# Patient Record
Sex: Female | Born: 1970
Health system: Southern US, Community
[De-identification: ages and names within clinical notes are randomized; demographics above are authoritative.]

## PROBLEM LIST (undated history)

## (undated) DIAGNOSIS — N83209 Unspecified ovarian cyst, unspecified side: Secondary | ICD-10-CM

## (undated) DIAGNOSIS — K859 Acute pancreatitis without necrosis or infection, unspecified: Secondary | ICD-10-CM

## (undated) DIAGNOSIS — K862 Cyst of pancreas: Secondary | ICD-10-CM

## (undated) DIAGNOSIS — I10 Essential (primary) hypertension: Secondary | ICD-10-CM

## (undated) HISTORY — PX: TUBAL LIGATION: SHX77

## (undated) HISTORY — PX: CHOLECYSTECTOMY: SHX55

---

## 1998-10-22 ENCOUNTER — Inpatient Hospital Stay (HOSPITAL_COMMUNITY): Admission: AD | Admit: 1998-10-22 | Discharge: 1998-10-22 | Payer: Self-pay | Admitting: Obstetrics & Gynecology

## 2003-01-07 ENCOUNTER — Inpatient Hospital Stay (HOSPITAL_COMMUNITY): Admission: AD | Admit: 2003-01-07 | Discharge: 2003-01-07 | Payer: Self-pay | Admitting: *Deleted

## 2004-04-21 ENCOUNTER — Emergency Department (HOSPITAL_COMMUNITY): Admission: EM | Admit: 2004-04-21 | Discharge: 2004-04-21 | Payer: Self-pay | Admitting: Emergency Medicine

## 2004-12-30 ENCOUNTER — Inpatient Hospital Stay (HOSPITAL_COMMUNITY): Admission: AD | Admit: 2004-12-30 | Discharge: 2004-12-30 | Payer: Self-pay | Admitting: *Deleted

## 2005-12-01 ENCOUNTER — Emergency Department (HOSPITAL_COMMUNITY): Admission: EM | Admit: 2005-12-01 | Discharge: 2005-12-01 | Payer: Self-pay | Admitting: Family Medicine

## 2006-01-13 ENCOUNTER — Emergency Department (HOSPITAL_COMMUNITY): Admission: EM | Admit: 2006-01-13 | Discharge: 2006-01-13 | Payer: Self-pay | Admitting: Family Medicine

## 2007-10-19 ENCOUNTER — Emergency Department (HOSPITAL_COMMUNITY): Admission: EM | Admit: 2007-10-19 | Discharge: 2007-10-19 | Payer: Self-pay | Admitting: Family Medicine

## 2008-05-11 ENCOUNTER — Emergency Department (HOSPITAL_COMMUNITY): Admission: EM | Admit: 2008-05-11 | Discharge: 2008-05-11 | Payer: Self-pay | Admitting: Emergency Medicine

## 2008-06-10 ENCOUNTER — Emergency Department: Payer: Self-pay | Admitting: Emergency Medicine

## 2009-07-26 ENCOUNTER — Emergency Department (HOSPITAL_COMMUNITY): Admission: EM | Admit: 2009-07-26 | Discharge: 2009-07-26 | Payer: Self-pay | Admitting: Emergency Medicine

## 2009-12-06 ENCOUNTER — Emergency Department (HOSPITAL_COMMUNITY): Admission: EM | Admit: 2009-12-06 | Discharge: 2009-12-06 | Payer: Self-pay | Admitting: Family Medicine

## 2010-01-07 ENCOUNTER — Inpatient Hospital Stay (HOSPITAL_COMMUNITY): Admission: EM | Admit: 2010-01-07 | Discharge: 2010-01-12 | Payer: Self-pay | Admitting: Emergency Medicine

## 2010-11-30 LAB — COMPREHENSIVE METABOLIC PANEL
ALT: 228 U/L — ABNORMAL HIGH (ref 0–35)
AST: 19 U/L (ref 0–37)
Albumin: 3.9 g/dL (ref 3.5–5.2)
BUN: 1 mg/dL — ABNORMAL LOW (ref 6–23)
BUN: 3 mg/dL — ABNORMAL LOW (ref 6–23)
CO2: 25 mEq/L (ref 19–32)
Calcium: 9 mg/dL (ref 8.4–10.5)
Chloride: 107 mEq/L (ref 96–112)
Creatinine, Ser: 0.56 mg/dL (ref 0.4–1.2)
GFR calc non Af Amer: >60 mL/min (ref >60)
GFR calc non Af Amer: >60 mL/min (ref >60)
Glucose, Bld: 101 mg/dL — ABNORMAL HIGH (ref 70–99)
Glucose, Bld: 119 mg/dL — ABNORMAL HIGH (ref 70–99)
Potassium: 3.1 mEq/L — ABNORMAL LOW (ref 3.5–5.1)
Potassium: 3.6 mEq/L (ref 3.5–5.1)
Sodium: 138 mEq/L (ref 135–145)
Total Bilirubin: 0.6 mg/dL (ref 0.3–1.2)
Total Protein: 5.7 g/dL — ABNORMAL LOW (ref 6.0–8.3)
Total Protein: 7.6 g/dL (ref 6.0–8.3)

## 2010-11-30 LAB — URINE DRUGS OF ABUSE SCREEN W ALC, ROUTINE (REF LAB)
Amphetamine Screen, Ur: NEGATIVE
Creatinine,U: 229.7 mg/dL
Ethyl Alcohol: <10 mg/dL (ref <10)
Methadone: POSITIVE — AB
Opiate Screen, Urine: NEGATIVE

## 2010-11-30 LAB — CBC
HCT: 27.1 % — ABNORMAL LOW (ref 36.0–46.0)
HCT: 28 % — ABNORMAL LOW (ref 36.0–46.0)
HCT: 29.5 % — ABNORMAL LOW (ref 36.0–46.0)
Hemoglobin: 9.1 g/dL — ABNORMAL LOW (ref 12.0–15.0)
Hemoglobin: 12.9 g/dL (ref 12.0–15.0)
MCHC: 33.5 g/dL (ref 30.0–36.0)
MCHC: 33.5 g/dL (ref 30.0–36.0)
MCV: 87.8 fL (ref 78.0–100.0)
MCV: 86.2 fL (ref 78.0–100.0)
Platelets: 195 10*3/uL (ref 150–400)
Platelets: 274 10*3/uL (ref 150–400)
RBC: 3.09 MIL/uL — ABNORMAL LOW (ref 3.87–5.11)
RBC: 3.25 MIL/uL — ABNORMAL LOW (ref 3.87–5.11)
RDW: 14.3 % (ref 11.5–15.5)
RDW: 14.6 % (ref 11.5–15.5)
WBC: 6.9 10*3/uL (ref 4.0–10.5)
WBC: 9.1 10*3/uL (ref 4.0–10.5)

## 2010-11-30 LAB — BASIC METABOLIC PANEL
BUN: 2 mg/dL — ABNORMAL LOW (ref 6–23)
BUN: 9 mg/dL (ref 6–23)
CO2: 28 mEq/L (ref 19–32)
Calcium: 8.1 mg/dL — ABNORMAL LOW (ref 8.4–10.5)
Calcium: 7.8 mg/dL — ABNORMAL LOW (ref 8.4–10.5)
Creatinine, Ser: 0.56 mg/dL (ref 0.4–1.2)
Creatinine, Ser: 0.63 mg/dL (ref 0.4–1.2)
GFR calc Af Amer: >60 mL/min (ref >60)
GFR calc non Af Amer: >60 mL/min (ref >60)
GFR calc non Af Amer: >60 mL/min (ref >60)
Glucose, Bld: 100 mg/dL — ABNORMAL HIGH (ref 70–99)
Glucose, Bld: 84 mg/dL (ref 70–99)
Potassium: 3.6 mEq/L (ref 3.5–5.1)
Potassium: 3.4 mEq/L — ABNORMAL LOW (ref 3.5–5.1)
Sodium: 140 mEq/L (ref 135–145)

## 2010-11-30 LAB — URINALYSIS, ROUTINE W REFLEX MICROSCOPIC
Glucose, UA: 100 mg/dL — AB
Hgb urine dipstick: NEGATIVE
Protein, ur: NEGATIVE mg/dL
Specific Gravity, Urine: 1.019 (ref 1.005–1.030)
pH: 7.5 (ref 5.0–8.0)

## 2010-11-30 LAB — LIPASE, BLOOD
Lipase: 130 U/L — ABNORMAL HIGH (ref 11–59)
Lipase: 138 U/L — ABNORMAL HIGH (ref 11–59)
Lipase: 142 U/L — ABNORMAL HIGH (ref 11–59)

## 2010-11-30 LAB — URINE MICROSCOPIC-ADD ON

## 2010-11-30 LAB — LIPID PANEL
HDL: 30 mg/dL — ABNORMAL LOW (ref >39)
LDL Cholesterol: 91 mg/dL (ref 0–99)
Total CHOL/HDL Ratio: 5.1 RATIO
Triglycerides: 153 mg/dL — ABNORMAL HIGH (ref <150)
VLDL: 31 mg/dL (ref 0–40)

## 2010-11-30 LAB — GLUCOSE, CAPILLARY
Glucose-Capillary: 101 mg/dL — ABNORMAL HIGH (ref 70–99)
Glucose-Capillary: 106 mg/dL — ABNORMAL HIGH (ref 70–99)
Glucose-Capillary: 139 mg/dL — ABNORMAL HIGH (ref 70–99)
Glucose-Capillary: 155 mg/dL — ABNORMAL HIGH (ref 70–99)
Glucose-Capillary: 90 mg/dL (ref 70–99)
Glucose-Capillary: 99 mg/dL (ref 70–99)
Glucose-Capillary: 64 mg/dL — ABNORMAL LOW (ref 70–99)
Glucose-Capillary: 92 mg/dL (ref 70–99)
Glucose-Capillary: 95 mg/dL (ref 70–99)
Glucose-Capillary: 96 mg/dL (ref 70–99)

## 2010-11-30 LAB — IRON AND TIBC
Saturation Ratios: 4 % — ABNORMAL LOW (ref 20–55)
TIBC: 301 ug/dL (ref 250–470)

## 2010-11-30 LAB — DIFFERENTIAL
Lymphs Abs: 0.9 10*3/uL (ref 0.7–4.0)
Monocytes Absolute: 0.6 10*3/uL (ref 0.1–1.0)
Monocytes Relative: 4 % (ref 3–12)
Neutro Abs: 14 10*3/uL — ABNORMAL HIGH (ref 1.7–7.7)
Neutrophils Relative %: 90 % — ABNORMAL HIGH (ref 43–77)

## 2010-11-30 LAB — THC (MARIJUANA), URINE, CONFIRMATION: Marijuana, Ur-Confirmation: 438 NG/ML — ABNORMAL HIGH

## 2010-11-30 LAB — HEPATIC FUNCTION PANEL
AST: 74 U/L — ABNORMAL HIGH (ref 0–37)
Bilirubin, Direct: 0.1 mg/dL (ref 0.0–0.3)

## 2010-11-30 LAB — METHADONE, CONFIRMATION: Methadone GC/MS Conf: 4313 NG/ML — ABNORMAL HIGH

## 2010-11-30 LAB — URINE CULTURE: Colony Count: 85000

## 2010-11-30 LAB — RETICULOCYTES: Retic Count, Absolute: 40.7 10*3/uL (ref 19.0–186.0)

## 2011-06-03 LAB — POCT PREGNANCY, URINE
Operator id: 239701
Preg Test, Ur: NEGATIVE

## 2011-06-03 LAB — POCT URINALYSIS DIP (DEVICE)
Bilirubin Urine: NEGATIVE
Glucose, UA: NEGATIVE
Nitrite: NEGATIVE
Urobilinogen, UA: 0.2
pH: 8.5 — ABNORMAL HIGH

## 2011-11-25 ENCOUNTER — Emergency Department (HOSPITAL_BASED_OUTPATIENT_CLINIC_OR_DEPARTMENT_OTHER)
Admission: EM | Admit: 2011-11-25 | Discharge: 2011-11-25 | Disposition: A | Discharge status: Home Health | Payer: Medicaid Other | Attending: Emergency Medicine | Admitting: Emergency Medicine

## 2011-11-25 ENCOUNTER — Emergency Department (INDEPENDENT_AMBULATORY_CARE_PROVIDER_SITE_OTHER): Payer: Medicaid Other

## 2011-11-25 ENCOUNTER — Encounter (HOSPITAL_BASED_OUTPATIENT_CLINIC_OR_DEPARTMENT_OTHER): Payer: Self-pay | Admitting: Family Medicine

## 2011-11-25 DIAGNOSIS — Z7982 Long term (current) use of aspirin: Secondary | ICD-10-CM | POA: Insufficient documentation

## 2011-11-25 DIAGNOSIS — R109 Unspecified abdominal pain: Secondary | ICD-10-CM | POA: Insufficient documentation

## 2011-11-25 DIAGNOSIS — K863 Pseudocyst of pancreas: Secondary | ICD-10-CM

## 2011-11-25 DIAGNOSIS — K089 Disorder of teeth and supporting structures, unspecified: Secondary | ICD-10-CM | POA: Insufficient documentation

## 2011-11-25 DIAGNOSIS — N83209 Unspecified ovarian cyst, unspecified side: Secondary | ICD-10-CM

## 2011-11-25 DIAGNOSIS — K862 Cyst of pancreas: Secondary | ICD-10-CM

## 2011-11-25 DIAGNOSIS — K838 Other specified diseases of biliary tract: Secondary | ICD-10-CM

## 2011-11-25 DIAGNOSIS — R110 Nausea: Secondary | ICD-10-CM | POA: Insufficient documentation

## 2011-11-25 DIAGNOSIS — Z9089 Acquired absence of other organs: Secondary | ICD-10-CM

## 2011-11-25 DIAGNOSIS — R10819 Abdominal tenderness, unspecified site: Secondary | ICD-10-CM | POA: Insufficient documentation

## 2011-11-25 HISTORY — DX: Cyst of pancreas: K86.2

## 2011-11-25 LAB — URINALYSIS, ROUTINE W REFLEX MICROSCOPIC
Bilirubin Urine: NEGATIVE
Glucose, UA: NEGATIVE mg/dL
Ketones, ur: NEGATIVE mg/dL
Specific Gravity, Urine: 1.009 (ref 1.005–1.030)
pH: 7 (ref 5.0–8.0)

## 2011-11-25 LAB — CBC
Hemoglobin: 12.1 g/dL (ref 12.0–15.0)
MCHC: 32.6 g/dL (ref 30.0–36.0)
Platelets: 237 10*3/uL (ref 150–400)

## 2011-11-25 LAB — COMPREHENSIVE METABOLIC PANEL
ALT: 14 U/L (ref 0–35)
AST: 15 U/L (ref 0–37)
Albumin: 3.5 g/dL (ref 3.5–5.2)
Alkaline Phosphatase: 82 U/L (ref 39–117)
BUN: 15 mg/dL (ref 6–23)
Chloride: 102 mEq/L (ref 96–112)
Potassium: 3.9 mEq/L (ref 3.5–5.1)
Sodium: 137 mEq/L (ref 135–145)
Total Bilirubin: 0.2 mg/dL — ABNORMAL LOW (ref 0.3–1.2)
Total Protein: 7.5 g/dL (ref 6.0–8.3)

## 2011-11-25 LAB — DIFFERENTIAL
Basophils Absolute: 0 10*3/uL (ref 0.0–0.1)
Basophils Relative: 0 % (ref 0–1)
Monocytes Relative: 10 % (ref 3–12)
Neutro Abs: 5.2 10*3/uL (ref 1.7–7.7)
Neutrophils Relative %: 59 % (ref 43–77)

## 2011-11-25 LAB — URINE MICROSCOPIC-ADD ON

## 2011-11-25 MED ORDER — IOHEXOL 300 MG/ML  SOLN
20.0000 mL | INTRAMUSCULAR | Status: AC
  Administered 2011-11-25 (×2): 20 mL via ORAL

## 2011-11-25 MED ORDER — MORPHINE SULFATE 4 MG/ML IJ SOLN
4.0000 mg | Freq: Once | INTRAMUSCULAR | Status: AC
  Administered 2011-11-25: 4 mg via INTRAVENOUS
  Filled 2011-11-25: qty 1

## 2011-11-25 MED ORDER — SODIUM CHLORIDE 0.9 % IV SOLN
Freq: Once | INTRAVENOUS | Status: AC
  Administered 2011-11-25: 12:00:00 via INTRAVENOUS

## 2011-11-25 MED ORDER — PROMETHAZINE HCL 25 MG/ML IJ SOLN
12.5000 mg | Freq: Once | INTRAMUSCULAR | Status: AC
  Administered 2011-11-25: 13:00:00 via INTRAVENOUS
  Filled 2011-11-25: qty 1

## 2011-11-25 MED ORDER — OXYCODONE-ACETAMINOPHEN 5-325 MG PO TABS: 1.0000 | ORAL_TABLET | Freq: Four times a day (QID) | ORAL | Status: AC | PRN

## 2011-11-25 MED ORDER — IOHEXOL 300 MG/ML  SOLN: 100.0000 mL | Freq: Once | INTRAMUSCULAR | Status: DC | PRN

## 2011-11-25 NOTE — ED Notes (Addendum)
Pt c/o "dental abscess" x 3 wks to left lower and left side (rib area), tenderness to palpation and pain with movement. Pt sts she had a "pancreas cyst" and hospitalized when she lived in [place] but has not followed up and does not have MD here. Pt denies nv/d. Pt sts left side pain x 1 month.

## 2011-11-25 NOTE — Discharge Instructions (Signed)

## 2011-11-25 NOTE — ED Provider Notes (Signed)
History     CSN: [id number]  Arrival date & time 11/25/11  1048   First MD Initiated Contact with Patient 11/25/11 1145      Chief Complaint  Patient presents with  . Dental Pain  . Flank Pain    (Consider location/radiation/quality/duration/timing/severity/associated sxs/prior treatment) HPI Comments: Patient has history of pancreatic pseudocyst which was treated with a long hospitalization in [place], [place].  She was discharged and has since moved here with no follow up.  She started with pain in the left side of the abdomen several days ago which is now worsening.  No fevers or chills.    Patient is a 41 y.o. female presenting with abdominal pain. The history is provided by the patient.  Abdominal Pain The primary symptoms of the illness include abdominal pain and nausea. The primary symptoms of the illness do not include fever, vomiting or diarrhea. Episode onset: several days ago. The problem has been gradually worsening.  Associated with: nothing. The patient states that she believes she is currently not pregnant. The patient has not had a change in bowel habit. Symptoms associated with the illness do not include chills.    Past Medical History  Diagnosis Date  . Pancreatic cyst     Past Surgical History  Procedure Date  . Cholecystectomy   . Tubal ligation     No family history on file.  History  Substance Use Topics  . Smoking status: Never Smoker   . Smokeless tobacco: Not on file  . Alcohol Use: No    OB History    Grav Para Term Preterm Abortions TAB SAB Ect Mult Living                  Review of Systems  Constitutional: Negative for fever and chills.  Gastrointestinal: Positive for nausea and abdominal pain. Negative for vomiting and diarrhea.  All other systems reviewed and are negative.    Allergies  Zofran  Home Medications   Current Outpatient Rx  Name Route Sig Dispense Refill  . ASPIRIN 81 MG PO TABS Oral Take 81 mg by mouth daily.       BP 162/107  Pulse 85  Temp(Src) 98.3 F (36.8 C) (Oral)  Resp 16  Ht 5\' 4"  (1.626 m)  Wt 260 lb (117.935 kg)  BMI 44.63 kg/m2  SpO2 99%  LMP 11/24/2011  Physical Exam  Nursing note and vitals reviewed. Constitutional: She is oriented to person, place, and time. She appears well-developed and well-nourished. No distress.  HENT:  Head: Normocephalic and atraumatic.  Neck: Normal range of motion. Neck supple.  Cardiovascular: Normal rate and regular rhythm.  Exam reveals no gallop and no friction rub.   No murmur heard. Pulmonary/Chest: Effort normal and breath sounds normal. No respiratory distress. She has no wheezes.  Abdominal: Soft. Bowel sounds are normal.       There is ttp in the epigastric and luq.  There is voluntary guarding but no rebound.  Bowel sounds are normoactive.  Musculoskeletal: Normal range of motion.  Neurological: She is alert and oriented to person, place, and time.  Skin: Skin is warm and dry. She is not diaphoretic.    ED Course  Procedures (including critical care time)   Labs Reviewed  CBC  DIFFERENTIAL  COMPREHENSIVE METABOLIC PANEL  LIPASE, BLOOD  URINALYSIS, ROUTINE W REFLEX MICROSCOPIC   No results found.   No diagnosis found.    MDM  The labs and ct scan look okay.  There  is a pseudocyst, but no sign of anything acute.  The wbc is okay and the liver and pancreatic enzymes are negative.  She needs local GI follow up and will be referred to [facility] for this.  To return prn.        Geoffery Lyons, MD 11/25/11 1425

## 2012-01-29 ENCOUNTER — Encounter (HOSPITAL_BASED_OUTPATIENT_CLINIC_OR_DEPARTMENT_OTHER): Payer: Self-pay | Admitting: Emergency Medicine

## 2012-01-29 ENCOUNTER — Emergency Department (HOSPITAL_BASED_OUTPATIENT_CLINIC_OR_DEPARTMENT_OTHER)
Admission: EM | Admit: 2012-01-29 | Discharge: 2012-01-29 | Disposition: A | Discharge status: Home / Self Care | Payer: Self-pay | Attending: Emergency Medicine | Admitting: Emergency Medicine

## 2012-01-29 DIAGNOSIS — K0889 Other specified disorders of teeth and supporting structures: Secondary | ICD-10-CM

## 2012-01-29 DIAGNOSIS — K089 Disorder of teeth and supporting structures, unspecified: Secondary | ICD-10-CM | POA: Insufficient documentation

## 2012-01-29 DIAGNOSIS — K029 Dental caries, unspecified: Secondary | ICD-10-CM | POA: Insufficient documentation

## 2012-01-29 MED ORDER — HYDROCODONE-ACETAMINOPHEN 5-325 MG PO TABS: 2.0000 | ORAL_TABLET | ORAL | Status: AC | PRN

## 2012-01-29 MED ORDER — PENICILLIN V POTASSIUM 500 MG PO TABS: 500.0000 mg | ORAL_TABLET | Freq: Four times a day (QID) | ORAL | Status: AC

## 2012-01-29 MED ORDER — PROMETHAZINE HCL 25 MG PO TABS: 25.0000 mg | ORAL_TABLET | Freq: Four times a day (QID) | ORAL | Status: DC | PRN

## 2012-01-29 NOTE — ED Notes (Signed)
Left lower dental pain x 3 days.  No known fever.

## 2012-01-29 NOTE — ED Provider Notes (Signed)
Medical screening examination/treatment/procedure(s) were performed by non-physician practitioner and as supervising physician I was immediately available for consultation/collaboration.   Dayton Bailiff, MD 01/29/12 1316

## 2012-01-29 NOTE — ED Provider Notes (Signed)
History     CSN: [id number]  Arrival date & time 01/29/12  1153   First MD Initiated Contact with Patient 01/29/12 1218      No chief complaint on file.   (Consider location/radiation/quality/duration/timing/severity/associated sxs/prior treatment) Patient is a 41 y.o. female presenting with tooth pain. The history is provided by the patient. No language interpreter was used.  Dental PainThe primary symptoms include oral lesions. The symptoms began more than 1 week ago. The symptoms are worsening. The symptoms are new. The symptoms occur constantly.  Additional symptoms include: gum swelling and gum tenderness.    Past Medical History  Diagnosis Date  . Pancreatic cyst     Past Surgical History  Procedure Date  . Cholecystectomy   . Tubal ligation     No family history on file.  History  Substance Use Topics  . Smoking status: Never Smoker   . Smokeless tobacco: Not on file  . Alcohol Use: No    OB History    Grav Para Term Preterm Abortions TAB SAB Ect Mult Living                  Review of Systems  HENT: Positive for mouth sores.   All other systems reviewed and are negative.    Allergies  Zofran  Home Medications   Current Outpatient Rx  Name Route Sig Dispense Refill  . ASPIRIN 81 MG PO TABS Oral Take 81 mg by mouth daily.      BP 150/94  Pulse 84  Temp(Src) 98.1 F (36.7 C) (Oral)  Resp 18  SpO2 100%  Physical Exam  Nursing note and vitals reviewed. Constitutional: She is oriented to person, place, and time. She appears well-developed and well-nourished.  HENT:  Head: Normocephalic and atraumatic.  Right Ear: External ear normal.  Left Ear: External ear normal.       Swollen lower gum line broken decayed teeth  Cardiovascular: Normal rate.   Pulmonary/Chest: Effort normal.  Musculoskeletal: Normal range of motion.  Neurological: She is alert and oriented to person, place, and time. She has normal reflexes.  Skin: Skin is warm.    Psychiatric: She has a normal mood and affect.    ED Course  Procedures (including critical care time)  Labs Reviewed - No data to display No results found.   No diagnosis found.    MDM  Pt given phenergan,hydrocodone and pcn,   Pt referred to [facility] for treatment       Elson Areas, [place] 01/29/12 1258

## 2012-01-29 NOTE — Discharge Instructions (Signed)
Dental Pain A tooth ache may be caused by cavities (tooth decay). Cavities expose the nerve of the tooth to air and hot or cold temperatures. It may come from an infection or abscess (also called a boil or furuncle) around your tooth. It is also often caused by dental caries (tooth decay). This causes the pain you are having. DIAGNOSIS  Your caregiver can diagnose this problem by exam. TREATMENT   If caused by an infection, it may be treated with medications which kill germs (antibiotics) and pain medications as prescribed by your caregiver. Take medications as directed.   Only take over-the-counter or prescription medicines for pain, discomfort, or fever as directed by your caregiver.   Whether the tooth ache today is caused by infection or dental disease, you should see your dentist as soon as possible for further care.  SEEK MEDICAL CARE IF: The exam and treatment you received today has been provided on an emergency basis only. This is not a substitute for complete medical or dental care. If your problem worsens or new problems (symptoms) appear, and you are unable to meet with your dentist, call or return to this location. SEEK IMMEDIATE MEDICAL CARE IF:   You have a fever.   You develop redness and swelling of your face, jaw, or neck.   You are unable to open your mouth.   You have severe pain uncontrolled by pain medicine.  MAKE SURE YOU:   Understand these instructions.   Will watch your condition.   Will get help right away if you are not doing well or get worse.  Document Released: 08/29/2005 Document Revised: 08/18/2011 Document Reviewed: 04/16/2008 [employer] Patient Information 2012 [employer].Dental Pain A tooth ache may be caused by cavities (tooth decay). Cavities expose the nerve of the tooth to air and hot or cold temperatures. It may come from an infection or abscess (also called a boil or furuncle) around your tooth. It is also often caused by dental caries (tooth  decay). This causes the pain you are having. DIAGNOSIS  Your caregiver can diagnose this problem by exam. TREATMENT   If caused by an infection, it may be treated with medications which kill germs (antibiotics) and pain medications as prescribed by your caregiver. Take medications as directed.   Only take over-the-counter or prescription medicines for pain, discomfort, or fever as directed by your caregiver.   Whether the tooth ache today is caused by infection or dental disease, you should see your dentist as soon as possible for further care.  SEEK MEDICAL CARE IF: The exam and treatment you received today has been provided on an emergency basis only. This is not a substitute for complete medical or dental care. If your problem worsens or new problems (symptoms) appear, and you are unable to meet with your dentist, call or return to this location. SEEK IMMEDIATE MEDICAL CARE IF:   You have a fever.   You develop redness and swelling of your face, jaw, or neck.   You are unable to open your mouth.   You have severe pain uncontrolled by pain medicine.  MAKE SURE YOU:   Understand these instructions.   Will watch your condition.   Will get help right away if you are not doing well or get worse.  Document Released: 08/29/2005 Document Revised: 08/18/2011 Document Reviewed: 04/16/2008 [employer] Patient Information 2012 [employer]. 

## 2012-02-11 ENCOUNTER — Emergency Department (HOSPITAL_BASED_OUTPATIENT_CLINIC_OR_DEPARTMENT_OTHER)
Admission: EM | Admit: 2012-02-11 | Discharge: 2012-02-11 | Disposition: A | Discharge status: Home / Self Care | Payer: Self-pay | Attending: Emergency Medicine | Admitting: Emergency Medicine

## 2012-02-11 ENCOUNTER — Encounter (HOSPITAL_BASED_OUTPATIENT_CLINIC_OR_DEPARTMENT_OTHER): Payer: Self-pay | Admitting: *Deleted

## 2012-02-11 DIAGNOSIS — K861 Other chronic pancreatitis: Secondary | ICD-10-CM | POA: Insufficient documentation

## 2012-02-11 DIAGNOSIS — R109 Unspecified abdominal pain: Secondary | ICD-10-CM

## 2012-02-11 DIAGNOSIS — R1011 Right upper quadrant pain: Secondary | ICD-10-CM | POA: Insufficient documentation

## 2012-02-11 DIAGNOSIS — R1013 Epigastric pain: Secondary | ICD-10-CM | POA: Insufficient documentation

## 2012-02-11 HISTORY — DX: Acute pancreatitis without necrosis or infection, unspecified: K85.90

## 2012-02-11 LAB — COMPREHENSIVE METABOLIC PANEL
AST: 16 U/L (ref 0–37)
Albumin: 3.8 g/dL (ref 3.5–5.2)
BUN: 18 mg/dL (ref 6–23)
CO2: 24 mEq/L (ref 19–32)
Calcium: 9 mg/dL (ref 8.4–10.5)
Creatinine, Ser: 0.7 mg/dL (ref 0.50–1.10)
GFR calc non Af Amer: >90 mL/min (ref >90)
Total Bilirubin: 0.3 mg/dL (ref 0.3–1.2)

## 2012-02-11 LAB — CBC
Hemoglobin: 12.5 g/dL (ref 12.0–15.0)
MCH: 29.6 pg (ref 26.0–34.0)
Platelets: 257 10*3/uL (ref 150–400)
RBC: 4.22 MIL/uL (ref 3.87–5.11)
WBC: 13.4 10*3/uL — ABNORMAL HIGH (ref 4.0–10.5)

## 2012-02-11 LAB — URINALYSIS, ROUTINE W REFLEX MICROSCOPIC
Bilirubin Urine: NEGATIVE
Ketones, ur: NEGATIVE mg/dL
Nitrite: NEGATIVE
Protein, ur: NEGATIVE mg/dL

## 2012-02-11 LAB — DIFFERENTIAL
Eosinophils Absolute: 0.2 10*3/uL (ref 0.0–0.7)
Lymphocytes Relative: 22 % (ref 12–46)
Lymphs Abs: 2.9 10*3/uL (ref 0.7–4.0)
Monocytes Relative: 9 % (ref 3–12)
Neutrophils Relative %: 68 % (ref 43–77)

## 2012-02-11 LAB — LIPASE, BLOOD: Lipase: 38 U/L (ref 11–59)

## 2012-02-11 LAB — URINE MICROSCOPIC-ADD ON

## 2012-02-11 LAB — PREGNANCY, URINE: Preg Test, Ur: NEGATIVE

## 2012-02-11 LAB — AMYLASE: Amylase: 69 U/L (ref 0–105)

## 2012-02-11 MED ORDER — HYDROMORPHONE HCL 2 MG PO TABS: 2.0000 mg | ORAL_TABLET | ORAL | Status: AC | PRN

## 2012-02-11 MED ORDER — SODIUM CHLORIDE 0.9 % IV BOLUS (SEPSIS)
1000.0000 mL | Freq: Once | INTRAVENOUS | Status: AC
  Administered 2012-02-11: 1000 mL via INTRAVENOUS

## 2012-02-11 MED ORDER — DIPHENHYDRAMINE HCL 50 MG/ML IJ SOLN
25.0000 mg | Freq: Once | INTRAMUSCULAR | Status: AC
  Administered 2012-02-11: 25 mg via INTRAVENOUS
  Filled 2012-02-11: qty 1

## 2012-02-11 MED ORDER — METOCLOPRAMIDE HCL 5 MG/ML IJ SOLN
10.0000 mg | Freq: Once | INTRAMUSCULAR | Status: AC
  Administered 2012-02-11: 10 mg via INTRAVENOUS
  Filled 2012-02-11: qty 2

## 2012-02-11 MED ORDER — HYDROMORPHONE HCL PF 1 MG/ML IJ SOLN
1.0000 mg | Freq: Once | INTRAMUSCULAR | Status: AC
  Administered 2012-02-11: 1 mg via INTRAVENOUS
  Filled 2012-02-11: qty 1

## 2012-02-11 MED ORDER — SODIUM CHLORIDE 0.9 % IV SOLN
Freq: Once | INTRAVENOUS | Status: AC
  Administered 2012-02-11: 18:00:00 via INTRAVENOUS

## 2012-02-11 MED ORDER — HYDROMORPHONE HCL PF 1 MG/ML IJ SOLN
1.0000 mg | Freq: Once | INTRAMUSCULAR | Status: AC
  Administered 2012-02-11: 1 mg via INTRAVENOUS

## 2012-02-11 MED ORDER — HYDROMORPHONE HCL PF 1 MG/ML IJ SOLN
INTRAMUSCULAR | Status: AC
  Filled 2012-02-11: qty 1

## 2012-02-11 MED ORDER — METOCLOPRAMIDE HCL 10 MG PO TABS: 10.0000 mg | ORAL_TABLET | Freq: Four times a day (QID) | ORAL | Status: DC | PRN

## 2012-02-11 NOTE — ED Notes (Signed)
MD at bedside. 

## 2012-02-11 NOTE — ED Provider Notes (Signed)
History   This chart was scribed for Teresa Booze, MD by Brooks Sailors. The patient was seen in room MH05/MH05. Patient's care was started at 1651.   CSN: [id number]  Arrival date & time 02/11/12  1651   First MD Initiated Contact with Patient 02/11/12 1714      Chief Complaint  Patient presents with  . Abdominal Pain    (Consider location/radiation/quality/duration/timing/severity/associated sxs/prior treatment) Patient is a 41 y.o. female presenting with abdominal pain.  Abdominal Pain The primary symptoms of the illness include abdominal pain. The current episode started more than 2 days ago. The onset of the illness was gradual. The problem has not changed since onset. The abdominal pain began more than 2 days ago. The pain came on gradually. The abdominal pain has been unchanged since its onset. The abdominal pain is located in the LUQ. The abdominal pain does not radiate. The severity of the abdominal pain is 6/10. The abdominal pain is relieved by nothing. The abdominal pain is exacerbated by eating.  Symptoms associated with the illness do not include back pain.    Teresa Valenzuela is a 41 y.o. female who presents to the Emergency Department complaining of abdominal pain onset four days ago with associated nausea. Pain is localized to the LUQ. Pt says the pain is persistent since onset and describes the pain as throbbing. Current pain is 6/10 with 9/10 at most severe. Pt denies diarrhea, vomiting, smoking. Nothing makes pain better, certain foods upset stomach more. Patient with history of pancreatic cyst and pancreatitis, cholecystectomy. Patient says she is not a drinker and allergic for Zofran.   Patient with no PCP or GI physician.     Past Medical History  Diagnosis Date  . Pancreatic cyst   . Pancreatitis     Past Surgical History  Procedure Date  . Cholecystectomy   . Tubal ligation     No family history on file.  History  Substance Use Topics  . Smoking  status: Never Smoker   . Smokeless tobacco: Never Used  . Alcohol Use: No    OB History    Grav Para Term Preterm Abortions TAB SAB Ect Mult Living                  Review of Systems  Gastrointestinal: Positive for abdominal pain.  Musculoskeletal: Negative for back pain.  All other systems reviewed and are negative.    Allergies  Zofran  Home Medications   Current Outpatient Rx  Name Route Sig Dispense Refill  . ACETAMINOPHEN 500 MG PO TABS Oral Take 1,000 mg by mouth every 6 (six) hours as needed. For pain    . NAPROXEN SODIUM 220 MG PO TABS Oral Take 440 mg by mouth 2 (two) times daily as needed. For pain      BP 157/99  Pulse 80  Temp(Src) 97.9 F (36.6 C) (Oral)  Resp 18  Ht 5\' 8"  (1.727 m)  Wt 270 lb (122.471 kg)  BMI 41.05 kg/m2  SpO2 100%  LMP 01/30/2012  Physical Exam  Nursing note and vitals reviewed. Constitutional: She is oriented to person, place, and time. She appears well-developed and well-nourished.  HENT:  Head: Normocephalic and atraumatic.  Eyes: Conjunctivae and EOM are normal. Pupils are equal, round, and reactive to light.       No scleral icterus   Neck: Normal range of motion. Neck supple.  Cardiovascular: Normal rate and regular rhythm.   Pulmonary/Chest: Effort normal and breath sounds  normal.  Abdominal: Soft. Bowel sounds are normal. There is tenderness in the epigastric area and left upper quadrant.       Severe LUQ tenderness, Moderate epigastric tenderness  Musculoskeletal: Normal range of motion.  Neurological: She is alert and oriented to person, place, and time.  Skin: Skin is warm and dry.  Psychiatric: She has a normal mood and affect.    ED Course  Procedures (including critical care time)  COORDINATION OF CARE: 1720 Patient informed of current plan for treatment and evaluation and agrees with plan at this time.  [phone] Patient had no relief of pain with first dose of Hydromorphone. Pt with relief of pain after second  dose.   Results for orders placed during the hospital encounter of 02/11/12  URINALYSIS, ROUTINE W REFLEX MICROSCOPIC      Component Value Range   Color, Urine YELLOW  YELLOW    APPearance CLOUDY (*) CLEAR    Specific Gravity, Urine 1.026  1.005 - 1.030    pH 5.5  5.0 - 8.0    Glucose, UA NEGATIVE  NEGATIVE (mg/dL)   Hgb urine dipstick NEGATIVE  NEGATIVE    Bilirubin Urine NEGATIVE  NEGATIVE    Ketones, ur NEGATIVE  NEGATIVE (mg/dL)   Protein, ur NEGATIVE  NEGATIVE (mg/dL)   Urobilinogen, UA 0.2  0.0 - 1.0 (mg/dL)   Nitrite NEGATIVE  NEGATIVE    Leukocytes, UA MODERATE (*) NEGATIVE   COMPREHENSIVE METABOLIC PANEL      Component Value Range   Sodium 138  135 - 145 (mEq/L)   Potassium 3.3 (*) 3.5 - 5.1 (mEq/L)   Chloride 104  96 - 112 (mEq/L)   CO2 24  19 - 32 (mEq/L)   Glucose, Bld 83  70 - 99 (mg/dL)   BUN 18  6 - 23 (mg/dL)   Creatinine, Ser 9.60  0.50 - 1.10 (mg/dL)   Calcium 9.0  8.4 - 45.4 (mg/dL)   Total Protein 7.3  6.0 - 8.3 (g/dL)   Albumin 3.8  3.5 - 5.2 (g/dL)   AST 16  0 - 37 (U/L)   ALT 16  0 - 35 (U/L)   Alkaline Phosphatase 79  39 - 117 (U/L)   Total Bilirubin 0.3  0.3 - 1.2 (mg/dL)   GFR calc non Af Amer >90  >90 (mL/min)   GFR calc Af Amer >90  >90 (mL/min)  CBC      Component Value Range   WBC 13.4 (*) 4.0 - 10.5 (K/uL)   RBC 4.22  3.87 - 5.11 (MIL/uL)   Hemoglobin 12.5  12.0 - 15.0 (g/dL)   HCT 09.8  11.9 - 14.7 (%)   MCV 86.0  78.0 - 100.0 (fL)   MCH 29.6  26.0 - 34.0 (pg)   MCHC 34.4  30.0 - 36.0 (g/dL)   RDW 82.9  56.2 - 13.0 (%)   Platelets 257  150 - 400 (K/uL)  DIFFERENTIAL      Component Value Range   Neutrophils Relative 68  43 - 77 (%)   Neutro Abs 9.1 (*) 1.7 - 7.7 (K/uL)   Lymphocytes Relative 22  12 - 46 (%)   Lymphs Abs 2.9  0.7 - 4.0 (K/uL)   Monocytes Relative 9  3 - 12 (%)   Monocytes Absolute 1.1 (*) 0.1 - 1.0 (K/uL)   Eosinophils Relative 1  0 - 5 (%)   Eosinophils Absolute 0.2  0.0 - 0.7 (K/uL)   Basophils Relative 0  0 - 1  (%)  Basophils Absolute 0.0  0.0 - 0.1 (K/uL)  AMYLASE      Component Value Range   Amylase 69  0 - 105 (U/L)  LIPASE, BLOOD      Component Value Range   Lipase 38  11 - 59 (U/L)  PREGNANCY, URINE      Component Value Range   Preg Test, Ur NEGATIVE  NEGATIVE   URINE MICROSCOPIC-ADD ON      Component Value Range   Squamous Epithelial / LPF FEW (*) RARE    WBC, UA 7-10  <3 (WBC/hpf)   RBC / HPF 0-2  <3 (RBC/hpf)   Bacteria, UA MANY (*) RARE    Urine-Other MUCOUS PRESENT       1. Abdominal pain   2. Chronic pancreatitis       MDM  Probable exacerbation of chronic pancreatitis. She will be given IV fluids, IV narcotics, and IV antiemetics and reassessed.  She did get good relief after second dose of hydromorphone. She was offered options of going home with narcotic prescription for admission and she wants to go home. She is in with prescriptions for hydromorphone and metoclopramide.    I personally performed the services described in this documentation, which was scribed in my presence. The recorded information has been reviewed and considered.      Teresa Booze, MD 02/11/12 [phone]

## 2012-02-11 NOTE — ED Notes (Signed)
C/o upper abd pain x 4 days- hx of pancreatic cyst- also c/o nausea

## 2012-02-11 NOTE — Discharge Instructions (Signed)
Return to the emergency department if pain and nausea are not being adequately controlled at home.  Acute Pancreatitis The pancreas is a large gland located behind your stomach. It produces (secretes) enzymes. These enzymes help digest food. It also releases the hormones glucagon and insulin. These hormones help regulate blood sugar. When the pancreas becomes inflamed, the disease is called pancreatitis. Inflammation of the pancreas occurs when enzymes from the pancreas begin attacking and digesting the pancreas. CAUSES  Most cases ofsudden onset (acute) pancreatitis are caused by:  Alcohol abuse.   Gallstones.  Other less common causes are:  Some medications.   Exposure to certain chemicals   Infection.   Damage caused by an accident (trauma).   Surgery of the belly (abdomen).  SYMPTOMS  Acute pancreatitis usually begins with pain in the upper abdomen and may radiate to the back. This pain may last a couple days. The constant pain varies from mild to severe. The acute form of this disease may vary from mild, nonspecific abdominal pain to profound shock with coma. About 1 in 5 cases are severe. These patients become dehydrated and develop low blood pressure. In severe cases, bleeding into the pancreas can lead to shock and death. The lungs, heart, and kidneys may fail. DIAGNOSIS  Your caregiver will form a clinical opinion after giving you an exam. Laboratory work is used to confirm this diagnosis. Often,a digestive enzyme from the pancreas (serum amylase) and other enzymes are elevated. Sugars and fats (lipids) in the blood may be elevated. There may also be changes in the following levels: calcium, magnesium, potassium, chloride and bicarbonate (chemicals in the blood). X-rays, a CT scan, or ultrasound of your abdomen may be necessary to search for other causes of your abdominal pain. TREATMENT  Most pancreatitis requires treatment of symptoms. Most acute attacks last a couple of days.  Your caregiver can discuss the treatment options with you.  If complications occur, hospitalization may be necessary for pain control and intravenous (IV) fluid replacement.   Sometimes, a tube may be put into the stomach to control vomiting.   Food may not be allowed for 3 to 4 days. This gives the pancreas time to rest. Giving the pancreas a rest means there is no stimulation that would produce more enzymes and cause more damage.   Medicines (antibiotics) that kill germs may be given if infection is the cause.   Sometimes, surgery may be required.   Following an acute attack, your caregiver will determine the cause, if possible, and offer suggestions to prevent recurrences.  HOME CARE INSTRUCTIONS   Eat smaller, more frequent meals. This reduces the amount of digestive juices the pancreas produces.   Decrease the amount of fat in your diet. This may help reduce loose, diarrheal stools.   Drink enough water and fluids to keep your urine clear or pale yellow. This is to avoid dehydration which can cause increased pain.   Talk to your caregiver about pain relievers or other medicines that may help.   Avoid anything that may have triggered your pancreatitis (for example, alcohol).   Follow the diet advised by your caregiver. Do not advance the diet too soon.   Take medicines as prescribed.   Get plenty of rest.   Check your blood sugar at home as directed by your caregiver.   If your caregiver has given you a follow-up appointment, it is very important to keep that appointment. Not keeping the appointment could result in a lasting (chronic) or permanent  injury, pain, and disability. If there is any problem keeping the appointment, you must call to reschedule.  SEEK MEDICAL CARE IF:   You are not recovering in the time described by your caregiver.   You have persistent pain, weakness, or feel sick to your stomach (nauseous).   You have recovered and then have another bout of  pain.  SEEK IMMEDIATE MEDICAL CARE IF:   You are unable to eat or keep fluids down.   Your pain increases a lot or changes.   You have an oral temperature above 102 F (38.9 C), not controlled by medicine.   Your skin or the white part of your eyes look yellow (jaundice).   You develop vomiting.   You feel dizzy or faint.   Your blood sugar is high (over 300).  MAKE SURE YOU:   Understand these instructions.   Will watch your condition.   Will get help right away if you are not doing well or get worse.  Document Released: 08/29/2005 Document Revised: 08/18/2011 Document Reviewed: 04/11/2008 [facility] Patient Information 2012 [place], [place].  Hydromorphone tablets What is this medicine? HYDROMORPHONE (hye droe MOR fone) is a pain reliever. It is used to treat moderate to severe pain. This medicine may be used for other purposes; ask your health care provider or pharmacist if you have questions. What should I tell my health care provider before I take this medicine? They need to know if you have any of these conditions: -brain tumor -drug abuse or addiction -head injury -heart disease -frequently drink alcohol containing drinks -kidney disease or problems going to the bathroom -liver disease -lung disease, asthma, or breathing problems -mental problems -an allergic or unusual reaction to lactose, hydromorphone, other opioid analgesics, other medicines, sulfites, foods, dyes, or preservatives -pregnant or trying to get pregnant -breast-feeding How should I use this medicine? Take this medicine by mouth with a glass of water. If the medicine upsets your stomach, take it with food or milk. Follow the directions on the prescription label. Do not take more medicine than you are told to take. Talk to your pediatrician regarding the use of this medicine in children. Special care may be needed. Overdosage: If you think you have taken too much of this medicine contact a poison  control center or emergency room at once. NOTE: This medicine is only for you. Do not share this medicine with others. What if I miss a dose? If you miss a dose, take it as soon as you can. If it is almost time for your next dose, take only that dose. Do not take double or extra doses. What may interact with this medicine? -alcohol -antihistamines for allergy, cough and cold -medicines for anesthesia -medicines for depression, anxiety, or psychotic disturbances -medicines for sleep -muscle relaxants -naltrexone -narcotic medicines for pain -phenothiazines like chlorpromazine, mesoridazine, prochlorperazine, thioridazine This list may not describe all possible interactions. Give your health care provider a list of all the medicines, herbs, non-prescription drugs, or dietary supplements you use. Also tell them if you smoke, drink alcohol, or use illegal drugs. Some items may interact with your medicine. What should I watch for while using this medicine? Tell your doctor or health care professional if your pain does not go away, if it gets worse, or if you have new or a different type of pain. You may develop tolerance to the medicine. Tolerance means that you will need a higher dose of the medicine for pain relief. Tolerance is normal and is  expected if you take this medicine for a long time. Do not suddenly stop taking your medicine because you may develop a severe reaction. Your body becomes used to the medicine. This does NOT mean you are addicted. Addiction is a behavior related to getting and using a drug for a non-medical reason. If you have pain, you have a medical reason to take pain medicine. Your doctor will tell you how much medicine to take. If your doctor wants you to stop the medicine, the dose will be slowly lowered over time to avoid any side effects. You may get drowsy or dizzy. Do not drive, use machinery, or do anything that needs mental alertness until you know how this medicine  affects you. Do not stand or sit up quickly, especially if you are an older patient. This reduces the risk of dizzy or fainting spells. Alcohol may interfere with the effect of this medicine. Avoid alcoholic drinks. This medicine will cause constipation. Try to have a bowel movement at least every 2 to 3 days. If you do not have a bowel movement for 3 days, call your doctor or health care professional. Your mouth may get dry. Chewing sugarless gum or sucking hard candy, and drinking plenty of water may help. Contact your doctor if the problem does not go away or is severe. What side effects may I notice from receiving this medicine? Side effects that you should report to your doctor or health care professional as soon as possible: -allergic reactions like skin rash, itching or hives, swelling of the face, lips, or tongue -breathing problems -changes in vision -confusion -feeling faint or lightheaded, falls -seizures -slow or fast heartbeat -trouble passing urine or change in the amount of urine -trouble with balance, talking, walking Side effects that usually do not require medical attention (report to your doctor or health care professional if they continue or are bothersome): -difficulty sleeping -drowsiness -dry mouth -flushing -headache -itching -loss of appetite -nausea, vomiting This list may not describe all possible side effects. Call your doctor for medical advice about side effects. You may report side effects to FDA at 1-800-FDA-1088. Where should I keep my medicine? Keep out of the reach of children. This medicine can be abused. Keep your medicine in a safe place to protect it from theft. Do not share this medicine with anyone. Selling or giving away this medicine is dangerous and against the law. Store at room temperature between 15 and 30 degrees C (59 and 86 degrees F). Keep container tightly closed. Protect from light. Flush any unused medicines down the toilet. Do not use  the medicine after the expiration date. NOTE: This sheet is a summary. It may not cover all possible information. If you have questions about this medicine, talk to your doctor, pharmacist, or health care provider.  2012, Elsevier/Gold Standard. (07/25/2008 10:24:26 AM)  Metoclopramide tablets What is this medicine? METOCLOPRAMIDE (met oh kloe PRA mide) is used to treat the symptoms of gastroesophageal reflux disease (GERD) like heartburn. It is also used to treat people with slow emptying of the stomach and intestinal tract. This medicine may be used for other purposes; ask your health care provider or pharmacist if you have questions. What should I tell my health care provider before I take this medicine? They need to know if you have any of these conditions: -breast cancer -depression -diabetes -heart failure -high blood pressure -kidney disease -liver disease -Parkinson's disease or a movement disorder -pheochromocytoma -seizures -stomach obstruction, bleeding, or perforation -an  unusual or allergic reaction to metoclopramide, procainamide, sulfites, other medicines, foods, dyes, or preservatives -pregnant or trying to get pregnant -breast-feeding How should I use this medicine? Take this medicine by mouth with a glass of water. Follow the directions on the prescription label. Take this medicine on an empty stomach, about 30 minutes before eating. Take your doses at regular intervals. Do not take your medicine more often than directed. Do not stop taking except on the advice of your doctor or health care professional. A special MedGuide will be given to you by the pharmacist with each prescription and refill. Be sure to read this information carefully each time. Talk to your pediatrician regarding the use of this medicine in children. Special care may be needed. Overdosage: If you think you have taken too much of this medicine contact a poison control center or emergency room at  once. NOTE: This medicine is only for you. Do not share this medicine with others. What if I miss a dose? If you miss a dose, take it as soon as you can. If it is almost time for your next dose, take only that dose. Do not take double or extra doses. What may interact with this medicine? -acetaminophen -cyclosporine -digoxin -medicines for blood pressure -medicines for diabetes, including insulin -medicines for hay fever and other allergies -medicines for depression, especially an Monoamine Oxidase Inhibitor (MAOI) -medicines for Parkinson's disease, like levodopa -medicines for sleep or for pain -tetracycline This list may not describe all possible interactions. Give your health care provider a list of all the medicines, herbs, non-prescription drugs, or dietary supplements you use. Also tell them if you smoke, drink alcohol, or use illegal drugs. Some items may interact with your medicine. What should I watch for while using this medicine? It may take a few weeks for your stomach condition to start to get better. However, do not take this medicine for longer than 12 weeks. The longer you take this medicine, and the more you take it, the greater your chances are of developing serious side effects. If you are an elderly patient, a female patient, or you have diabetes, you may be at an increased risk for side effects from this medicine. Contact your doctor immediately if you start having movements you cannot control such as lip smacking, rapid movements of the tongue, involuntary or uncontrollable movements of the eyes, head, arms and legs, or muscle twitches and spasms. Patients and their families should watch out for worsening depression or thoughts of suicide. Also watch out for any sudden or severe changes in feelings such as feeling anxious, agitated, panicky, irritable, hostile, aggressive, impulsive, severely restless, overly excited and hyperactive, or not being able to sleep. If this  happens, especially at the beginning of treatment or after a change in dose, call your doctor. Do not treat yourself for high fever. Ask your doctor or health care professional for advice. You may get drowsy or dizzy. Do not drive, use machinery, or do anything that needs mental alertness until you know how this drug affects you. Do not stand or sit up quickly, especially if you are an older patient. This reduces the risk of dizzy or fainting spells. Alcohol can make you more drowsy and dizzy. Avoid alcoholic drinks. What side effects may I notice from receiving this medicine? Side effects that you should report to your doctor or health care professional as soon as possible: -allergic reactions like skin rash, itching or hives, swelling of the face, lips, or tongue -  abnormal production of milk in females -breast enlargement in both males and females -change in the way you walk -difficulty moving, speaking or swallowing -drooling, lip smacking, or rapid movements of the tongue -excessive sweating -fever -involuntary or uncontrollable movements of the eyes, head, arms and legs -irregular heartbeat or palpitations -muscle twitches and spasms -unusually weak or tired Side effects that usually do not require medical attention (report to your doctor or health care professional if they continue or are bothersome): -change in sex drive or performance -depressed mood -diarrhea -difficulty sleeping -headache -menstrual changes -restless or nervous This list may not describe all possible side effects. Call your doctor for medical advice about side effects. You may report side effects to FDA at 1-800-FDA-1088. Where should I keep my medicine? Keep out of the reach of children. Store at room temperature between 20 and 25 degrees C (68 and 77 degrees F). Protect from light. Keep container tightly closed. Throw away any unused medicine after the expiration date. NOTE: This sheet is a summary. It may  not cover all possible information. If you have questions about this medicine, talk to your doctor, pharmacist, or health care provider.  2012, Elsevier/Gold Standard. (04/23/2008 4:30:05 PM)  RESOURCE GUIDE  Chronic Pain Problems: Contact [facility]  [phone] Patients need to be referred by their primary care doctor.  Insufficient Money for Medicine: Contact United Way:  call "211" or [facility] 502-200-3377.  No Primary Care Doctor: - Call [facility]  [phone] - can help you locate a primary care doctor that  accepts your insurance, provides certain services, etc. - Physician Referral [phone]  Agencies that provide inexpensive medical care: - [facility]  [phone] - [facility]  [phone] - [facility]  [phone] [facility]  [phone] - [facility]  [phone] [facility]  [phone]  Medicaid-accepting [facility] Providers: - [facility]- [address]  [phone], Mon-Fri 9am-7pm, Sat 9am-1pm - [facility]- [address] [place], [address] [place]  [phone] - [facility]- [address], [address] [place]  [phone] [facility]- [address]  [phone] - Renaye Rakers- [address] [place], [address], [phone]  Only accepts [place] patients after they have their name  applied to their card  Self Pay (no insurance) in [place]: - Sickle Cell Patients: Dr Willey Blade, [facility]  [address] [place], [phone] - [facility]- [address] [place]  [phone]       [facility]- [address]       -     [facility]- see information above (Speak to [employer] if you do not have insurance)       -  [facility]- [address] [place], [phone]       -  [facility]- [address],  [phone]       -  [facility]- [address], [phone]       -  Dr Julio Sicks-  [address], [address], [place], [phone]       -  [facility]- [address], [phone]       -  [facility]- [address] [phone], also  [address], [phone]       -    [facility] [place]- [address] [place], [phone], 1st & 3rd Saturday   every month, 10am-1pm  1) Find a Doctor and Pay Out of Pocket Although you won't have to find out who is covered by your insurance plan, it is a good idea to ask around and get recommendations. You will then need to call the office and see if the doctor you have chosen will accept you as a new patient and what types of options they offer for patients who are self-pay. Some doctors offer discounts or will set up payment plans for their patients who do not have insurance, but you will need to ask so you aren't surprised when you get to your appointment.  2) Contact Your Local Health Department Not all health departments have doctors that can see patients for sick visits, but many do, so it is worth a call to see if yours does. If you don't know where your local health department is, you can check in your phone book. The CDC also has a tool to help you locate your state's health department, and many state websites also have listings of all of their local health departments.  3) Find a Walk-in Clinic If your illness is not likely to be very severe or complicated, you may want to try a walk in clinic. These are popping up all over the country in pharmacies, drugstores, and shopping centers. They're usually staffed by nurse practitioners or physician assistants that have been trained to treat common illnesses and complaints. They're usually fairly quick and inexpensive. However, if you have serious medical issues or chronic medical problems, these are probably not your best option  STD  Testing - [facility] Department of [facility], STD Clinic, [address], [place], phone [phone] or [phone].  Monday - Friday, call for an appointment. [facility] Department of [employer], STD Clinic, [address], [place], phone [phone] or [phone].  Monday - Friday, call for an appointment.  Abuse/Neglect: [facility] Child Abuse Hotline 571-181-7389 [facility] Child Abuse Hotline 513-636-0063 (After Hours)  Emergency Shelter:  [facility] [phone]  Maternity Homes: - [facility] [place] [facility] 918-285-2096 - [facility] [phone]  MRSA Hotline #:   8481554575  [facility] Resources  [facility] [place]  United Way [facility] Dept. [address]                 [address]         [address] [place] [address]  [place]                                               Cristobal Goldmann Phone:  [phone]                                  Phone:  [phone]  Phone:  [phone]  [facility], [phone] - [facility] - [facility]318 [phone]       -     [facility] in [place], [address]                                  [phone], [facility] Child Abuse Hotline (478)228-6950 or 403-563-1711 (After Hours)   Behavioral Health Services  Substance Abuse Resources: - [facility] Drug [facility]  507-400-7321 - [facility] [phone] - [facility] [phone] Floydene Flock [phone] - Residential & Outpatient Substance Abuse Program  [phone]  Psychological Services: [facility]  [phone] [facility]  [phone] - [facility], [phone] [place]. [address] [place], ACCESS LINE: 509-767-5637 or 662-215-4309,  [employer].za  Dental Assistance  If unable to pay or uninsured, contact:  [facility] or [facility] to become qualified for the adult dental clinic.  Patients with Medicaid: [facility] [phone], [phone] [address], [phone]  If unable to pay, or uninsured, contact [facility] 757-220-0023) or [facility] Department [phone] in [place], [phone] in [facility]) to become qualified for the adult dental clinic  Other Low-Cost Community Dental Services: - [facility]- [address] [place], [place], [place], [address], [phone], [phone], 2nd and 4th Thursday of the month at 6:30am.  10 clients each day by appointment, can sometimes see walk-in patients if someone does not show for an appointment. [facility]- [address] [place], [place], [address], [phone] - [facility]- [address], [place], [place], [address], [phone] - [facility]- [phone] [facility] Department- [phone] [facility] Department- [phone]

## 2012-03-09 ENCOUNTER — Emergency Department (HOSPITAL_BASED_OUTPATIENT_CLINIC_OR_DEPARTMENT_OTHER)
Admission: EM | Admit: 2012-03-09 | Discharge: 2012-03-09 | Disposition: A | Discharge status: Home / Self Care | Payer: Self-pay | Attending: Emergency Medicine | Admitting: Emergency Medicine

## 2012-03-09 ENCOUNTER — Encounter (HOSPITAL_BASED_OUTPATIENT_CLINIC_OR_DEPARTMENT_OTHER): Payer: Self-pay | Admitting: *Deleted

## 2012-03-09 DIAGNOSIS — R109 Unspecified abdominal pain: Secondary | ICD-10-CM | POA: Insufficient documentation

## 2012-03-09 LAB — URINALYSIS, ROUTINE W REFLEX MICROSCOPIC
Glucose, UA: NEGATIVE mg/dL
Protein, ur: NEGATIVE mg/dL
Specific Gravity, Urine: 1.019 (ref 1.005–1.030)
Urobilinogen, UA: 0.2 mg/dL (ref 0.0–1.0)

## 2012-03-09 LAB — CBC WITH DIFFERENTIAL/PLATELET
Basophils Absolute: 0 10*3/uL (ref 0.0–0.1)
Basophils Relative: 0 % (ref 0–1)
Eosinophils Absolute: 0.2 10*3/uL (ref 0.0–0.7)
Eosinophils Relative: 2 % (ref 0–5)
HCT: 33.7 % — ABNORMAL LOW (ref 36.0–46.0)
Lymphocytes Relative: 22 % (ref 12–46)
MCH: 29.6 pg (ref 26.0–34.0)
MCHC: 33.8 g/dL (ref 30.0–36.0)
MCV: 87.5 fL (ref 78.0–100.0)
Monocytes Absolute: 0.7 10*3/uL (ref 0.1–1.0)
Platelets: 243 10*3/uL (ref 150–400)
RDW: 12.5 % (ref 11.5–15.5)

## 2012-03-09 LAB — COMPREHENSIVE METABOLIC PANEL
AST: 16 U/L (ref 0–37)
CO2: 24 mEq/L (ref 19–32)
Calcium: 8.6 mg/dL (ref 8.4–10.5)
Creatinine, Ser: 0.5 mg/dL (ref 0.50–1.10)
GFR calc Af Amer: >90 mL/min (ref >90)
GFR calc non Af Amer: >90 mL/min (ref >90)
Sodium: 137 mEq/L (ref 135–145)
Total Protein: 6.6 g/dL (ref 6.0–8.3)

## 2012-03-09 LAB — PREGNANCY, URINE: Preg Test, Ur: NEGATIVE

## 2012-03-09 LAB — URINE MICROSCOPIC-ADD ON

## 2012-03-09 MED ORDER — HYDROCODONE-ACETAMINOPHEN 5-500 MG PO TABS: 1.0000 | ORAL_TABLET | Freq: Four times a day (QID) | ORAL | Status: AC | PRN

## 2012-03-09 MED ORDER — HYDROMORPHONE HCL PF 1 MG/ML IJ SOLN
1.0000 mg | Freq: Once | INTRAMUSCULAR | Status: AC
  Administered 2012-03-09: 1 mg via INTRAVENOUS
  Filled 2012-03-09: qty 1

## 2012-03-09 MED ORDER — METOCLOPRAMIDE HCL 5 MG/ML IJ SOLN
10.0000 mg | Freq: Once | INTRAMUSCULAR | Status: AC
  Administered 2012-03-09: 10 mg via INTRAVENOUS
  Filled 2012-03-09: qty 2

## 2012-03-09 MED ORDER — SODIUM CHLORIDE 0.9 % IV BOLUS (SEPSIS)
1000.0000 mL | Freq: Once | INTRAVENOUS | Status: AC
  Administered 2012-03-09: 1000 mL via INTRAVENOUS

## 2012-03-09 MED ORDER — METOCLOPRAMIDE HCL 10 MG PO TABS: 10.0000 mg | ORAL_TABLET | Freq: Four times a day (QID) | ORAL | Status: DC

## 2012-03-09 NOTE — ED Notes (Signed)
Pt amb to room 3 with quick steady gait in nad. Pt reports she has been dx with a cyst on her pancreas, and has had abdominal pain x 4 days. Denies any n/v/d or fevers.

## 2012-03-09 NOTE — ED Provider Notes (Signed)
History     CSN: [id number]  Arrival date & time 03/09/12  4098   First MD Initiated Contact with Patient 03/09/12 [phone]      Chief Complaint  Patient presents with  . Abdominal Pain    (Consider location/radiation/quality/duration/timing/severity/associated sxs/prior treatment) HPI Comments: Patient with a history of chronic pancreatitis and diagnosis of pancreatic pseudocyst appears with chief complaint of upper abdominal pain. She states is the same pain, that she's had previously with her pancreatitis. This pain has been going on for the last 4 days, has been gradually getting worse. Describes it as sharp pain in her upper abdomen. Otherwise nonradiating. She's had some loose stools, but no diarrhea. She's had some nausea but no vomiting. Denies any fevers. Denies any other recent illnesses. She was diagnosed with a pseudocyst on CT scan on March of this year. She has not yet been able to followup with anyone. Due to insurance, however,  she's trying to get Medicaid and will follow up with the GI doctor when she gets Medicaid. She's been using Tylenol and Naprosyn for the pain without relief  Patient is a 41 y.o. female presenting with abdominal pain.  Abdominal Pain The primary symptoms of the illness include abdominal pain and nausea. The primary symptoms of the illness do not include fever, fatigue, shortness of breath, vomiting or diarrhea. Episode onset: 4 days. The onset of the illness was gradual. The problem has been gradually worsening.  Symptoms associated with the illness do not include chills, diaphoresis, hematuria, frequency or back pain.    Past Medical History  Diagnosis Date  . Pancreatic cyst   . Pancreatitis     Past Surgical History  Procedure Date  . Cholecystectomy   . Tubal ligation     History reviewed. No pertinent family history.  History  Substance Use Topics  . Smoking status: Never Smoker   . Smokeless tobacco: Never Used  . Alcohol Use: No     OB History    Grav Para Term Preterm Abortions TAB SAB Ect Mult Living                  Review of Systems  Constitutional: Negative for fever, chills, diaphoresis and fatigue.  HENT: Negative for congestion, rhinorrhea and sneezing.   Eyes: Negative.   Respiratory: Negative for cough, chest tightness and shortness of breath.   Cardiovascular: Negative for chest pain and leg swelling.  Gastrointestinal: Positive for nausea and abdominal pain. Negative for vomiting, diarrhea and blood in stool.  Genitourinary: Negative for frequency, hematuria, flank pain and difficulty urinating.  Musculoskeletal: Negative for back pain and arthralgias.  Skin: Negative for rash.  Neurological: Negative for dizziness, speech difficulty, weakness, numbness and headaches.    Allergies  Zofran  Home Medications   Current Outpatient Rx  Name Route Sig Dispense Refill  . ACETAMINOPHEN 500 MG PO TABS Oral Take 1,000 mg by mouth every 6 (six) hours as needed. For pain    . HYDROCODONE-ACETAMINOPHEN 5-500 MG PO TABS Oral Take 1-2 tablets by mouth every 6 (six) hours as needed for pain. 15 tablet 0  . METOCLOPRAMIDE HCL 10 MG PO TABS Oral Take 1 tablet (10 mg total) by mouth every 6 (six) hours as needed (nausea). 30 tablet 0  . METOCLOPRAMIDE HCL 10 MG PO TABS Oral Take 1 tablet (10 mg total) by mouth every 6 (six) hours. 30 tablet 0  . NAPROXEN SODIUM 220 MG PO TABS Oral Take 440 mg by mouth 2 (two)  times daily as needed. For pain      BP 165/99  Pulse 70  Temp 98 F (36.7 C) (Oral)  Resp 18  Ht 5\' 6"  (1.676 m)  Wt 270 lb (122.471 kg)  BMI 43.58 kg/m2  SpO2 100%  LMP 03/01/2012  Physical Exam  Constitutional: She is oriented to person, place, and time. She appears well-developed and well-nourished.  HENT:  Head: Normocephalic and atraumatic.  Eyes: Conjunctivae are normal. Pupils are equal, round, and reactive to light.       No scleral icterus  Neck: Normal range of motion. Neck  supple.  Cardiovascular: Normal rate, regular rhythm and normal heart sounds.   Pulmonary/Chest: Effort normal and breath sounds normal. No respiratory distress. She has no wheezes. She has no rales. She exhibits no tenderness.  Abdominal: Soft. Bowel sounds are normal. There is tenderness. There is no rebound and no guarding.  Musculoskeletal: Normal range of motion. She exhibits no edema.  Lymphadenopathy:    She has no cervical adenopathy.  Neurological: She is alert and oriented to person, place, and time.  Skin: Skin is warm and dry. No rash noted.  Psychiatric: She has a normal mood and affect.    ED Course  Procedures (including critical care time)  Results for orders placed during the hospital encounter of 03/09/12  CBC WITH DIFFERENTIAL      Component Value Range   WBC 9.8  4.0 - 10.5 K/uL   RBC 3.85 (*) 3.87 - 5.11 MIL/uL   Hemoglobin 11.4 (*) 12.0 - 15.0 g/dL   HCT 16.1 (*) 09.6 - 04.5 %   MCV 87.5  78.0 - 100.0 fL   MCH 29.6  26.0 - 34.0 pg   MCHC 33.8  30.0 - 36.0 g/dL   RDW 40.9  81.1 - 91.4 %   Platelets 243  150 - 400 K/uL   Neutrophils Relative 69  43 - 77 %   Neutro Abs 6.8  1.7 - 7.7 K/uL   Lymphocytes Relative 22  12 - 46 %   Lymphs Abs 2.1  0.7 - 4.0 K/uL   Monocytes Relative 7  3 - 12 %   Monocytes Absolute 0.7  0.1 - 1.0 K/uL   Eosinophils Relative 2  0 - 5 %   Eosinophils Absolute 0.2  0.0 - 0.7 K/uL   Basophils Relative 0  0 - 1 %   Basophils Absolute 0.0  0.0 - 0.1 K/uL  COMPREHENSIVE METABOLIC PANEL      Component Value Range   Sodium 137  135 - 145 mEq/L   Potassium 3.5  3.5 - 5.1 mEq/L   Chloride 105  96 - 112 mEq/L   CO2 24  19 - 32 mEq/L   Glucose, Bld 102 (*) 70 - 99 mg/dL   BUN 10  6 - 23 mg/dL   Creatinine, Ser 7.82  0.50 - 1.10 mg/dL   Calcium 8.6  8.4 - 95.6 mg/dL   Total Protein 6.6  6.0 - 8.3 g/dL   Albumin 3.4 (*) 3.5 - 5.2 g/dL   AST 16  0 - 37 U/L   ALT 16  0 - 35 U/L   Alkaline Phosphatase 70  39 - 117 U/L   Total Bilirubin  0.3  0.3 - 1.2 mg/dL   GFR calc non Af Amer >90  >90 mL/min   GFR calc Af Amer >90  >90 mL/min  LIPASE, BLOOD      Component Value Range   Lipase  28  11 - 59 U/L  URINALYSIS, ROUTINE W REFLEX MICROSCOPIC      Component Value Range   Color, Urine YELLOW  YELLOW   APPearance CLEAR  CLEAR   Specific Gravity, Urine 1.019  1.005 - 1.030   pH 7.5  5.0 - 8.0   Glucose, UA NEGATIVE  NEGATIVE mg/dL   Hgb urine dipstick NEGATIVE  NEGATIVE   Bilirubin Urine NEGATIVE  NEGATIVE   Ketones, ur NEGATIVE  NEGATIVE mg/dL   Protein, ur NEGATIVE  NEGATIVE mg/dL   Urobilinogen, UA 0.2  0.0 - 1.0 mg/dL   Nitrite NEGATIVE  NEGATIVE   Leukocytes, UA SMALL (*) NEGATIVE  PREGNANCY, URINE      Component Value Range   Preg Test, Ur NEGATIVE  NEGATIVE  URINE MICROSCOPIC-ADD ON      Component Value Range   Squamous Epithelial / LPF MANY (*) RARE   WBC, UA 0-2  <3 WBC/hpf   Bacteria, UA MANY (*) RARE   Urine-Other MUCOUS PRESENT     No results found.   No results found.   1. Abdominal pain       MDM  Pt is feeling much better after zofran, one dose of dilaudid.  Ready to go home.  Lipase normal.  Do not feel that Ct scan is indicated today.  Encouraged pt to f/u with GI        Rolan Bucco, MD 03/09/12 1151

## 2012-04-22 ENCOUNTER — Encounter (HOSPITAL_BASED_OUTPATIENT_CLINIC_OR_DEPARTMENT_OTHER): Payer: Self-pay | Admitting: *Deleted

## 2012-04-22 ENCOUNTER — Emergency Department (HOSPITAL_BASED_OUTPATIENT_CLINIC_OR_DEPARTMENT_OTHER)
Admission: EM | Admit: 2012-04-22 | Discharge: 2012-04-23 | Disposition: A | Discharge status: Home / Self Care | Payer: Self-pay | Attending: Emergency Medicine | Admitting: Emergency Medicine

## 2012-04-22 DIAGNOSIS — R109 Unspecified abdominal pain: Secondary | ICD-10-CM | POA: Insufficient documentation

## 2012-04-22 DIAGNOSIS — K862 Cyst of pancreas: Secondary | ICD-10-CM | POA: Insufficient documentation

## 2012-04-22 LAB — URINALYSIS, ROUTINE W REFLEX MICROSCOPIC
Ketones, ur: NEGATIVE mg/dL
Nitrite: NEGATIVE
Protein, ur: NEGATIVE mg/dL
Urobilinogen, UA: 0.2 mg/dL (ref 0.0–1.0)

## 2012-04-22 LAB — COMPREHENSIVE METABOLIC PANEL
AST: 15 U/L (ref 0–37)
BUN: 16 mg/dL (ref 6–23)
CO2: 27 mEq/L (ref 19–32)
Calcium: 9 mg/dL (ref 8.4–10.5)
Chloride: 103 mEq/L (ref 96–112)
Creatinine, Ser: 0.7 mg/dL (ref 0.50–1.10)
GFR calc non Af Amer: >90 mL/min (ref >90)
Total Bilirubin: 0.2 mg/dL — ABNORMAL LOW (ref 0.3–1.2)

## 2012-04-22 LAB — CBC WITH DIFFERENTIAL/PLATELET
Basophils Absolute: 0 10*3/uL (ref 0.0–0.1)
Basophils Relative: 0 % (ref 0–1)
Eosinophils Relative: 3 % (ref 0–5)
HCT: 33.7 % — ABNORMAL LOW (ref 36.0–46.0)
Hemoglobin: 11.6 g/dL — ABNORMAL LOW (ref 12.0–15.0)
Lymphocytes Relative: 30 % (ref 12–46)
MCHC: 34.4 g/dL (ref 30.0–36.0)
MCV: 87.8 fL (ref 78.0–100.0)
Monocytes Absolute: 0.8 10*3/uL (ref 0.1–1.0)
Monocytes Relative: 8 % (ref 3–12)
Neutro Abs: 5.3 10*3/uL (ref 1.7–7.7)
RDW: 12.2 % (ref 11.5–15.5)

## 2012-04-22 LAB — URINE MICROSCOPIC-ADD ON

## 2012-04-22 LAB — LIPASE, BLOOD: Lipase: 38 U/L (ref 11–59)

## 2012-04-22 MED ORDER — PROMETHAZINE HCL 25 MG/ML IJ SOLN
12.5000 mg | Freq: Once | INTRAMUSCULAR | Status: AC
  Administered 2012-04-22: 12.5 mg via INTRAVENOUS
  Filled 2012-04-22: qty 1

## 2012-04-22 MED ORDER — SODIUM CHLORIDE 0.9 % IV SOLN
Freq: Once | INTRAVENOUS | Status: AC
  Administered 2012-04-22: 1000 mL via INTRAVENOUS

## 2012-04-22 MED ORDER — HYDROMORPHONE HCL PF 1 MG/ML IJ SOLN
1.0000 mg | Freq: Once | INTRAMUSCULAR | Status: AC
  Administered 2012-04-22: 1 mg via INTRAVENOUS
  Filled 2012-04-22: qty 1

## 2012-04-22 MED ORDER — PROMETHAZINE HCL 25 MG PO TABS: 25.0000 mg | ORAL_TABLET | Freq: Four times a day (QID) | ORAL | Status: DC | PRN

## 2012-04-22 MED ORDER — OXYCODONE-ACETAMINOPHEN 5-325 MG PO TABS: 2.0000 | ORAL_TABLET | ORAL | Status: AC | PRN

## 2012-04-22 NOTE — ED Notes (Signed)
Left abd pain. Hx of pancreatitis.

## 2012-04-22 NOTE — ED Provider Notes (Signed)
History     CSN: [id number]  Arrival date & time 04/22/12  1941   First MD Initiated Contact with Patient 04/22/12 2033      Chief Complaint  Patient presents with  . Abdominal Pain    (Consider location/radiation/quality/duration/timing/severity/associated sxs/prior treatment) Patient is a 41 y.o. female presenting with abdominal pain. The history is provided by the patient. No language interpreter was used.  Abdominal Pain The primary symptoms of the illness include abdominal pain and nausea. The current episode started 13 to 24 hours ago. The onset of the illness was gradual. The problem has been gradually worsening.  Associated with: hx of pancreatic cyst.  The patient states that she believes she is currently not pregnant. The patient has not had a change in bowel habit. Significant associated medical issues include gallstones.  Pt complains of abdominal pain.  Pt reports she has a history of pancreatitis and a pancreatic cyst.  No local MD.  Pt has not been able to follow up with GI due to lack of insurance  Past Medical History  Diagnosis Date  . Pancreatic cyst   . Pancreatitis     Past Surgical History  Procedure Date  . Cholecystectomy   . Tubal ligation     No family history on file.  History  Substance Use Topics  . Smoking status: Never Smoker   . Smokeless tobacco: Never Used  . Alcohol Use: No    OB History    Grav Para Term Preterm Abortions TAB SAB Ect Mult Living                  Review of Systems  Gastrointestinal: Positive for nausea and abdominal pain.  All other systems reviewed and are negative.    Allergies  Zofran and Reglan  Home Medications   Current Outpatient Rx  Name Route Sig Dispense Refill  . DIMENHYDRINATE 50 MG PO TABS Oral Take 12.5 mg by mouth every 8 (eight) hours as needed. For nausea    . IBUPROFEN 200 MG PO TABS Oral Take 400 mg by mouth every 6 (six) hours as needed. For pain    . NAPROXEN SODIUM 220 MG PO TABS  Oral Take 220-440 mg by mouth 2 (two) times daily as needed. For pain      BP 166/98  Pulse 93  Temp 98 F (36.7 C) (Oral)  Resp 20  SpO2 100%  Physical Exam  Nursing note and vitals reviewed. Constitutional: She is oriented to person, place, and time. She appears well-developed.  HENT:  Head: Normocephalic and atraumatic.  Right Ear: External ear normal.  Left Ear: External ear normal.  Eyes: Conjunctivae and EOM are normal. Pupils are equal, round, and reactive to light.  Neck: Normal range of motion. Neck supple.  Cardiovascular: Normal rate and normal heart sounds.   Pulmonary/Chest: Effort normal.  Abdominal: Soft. Bowel sounds are normal. There is tenderness.  Musculoskeletal: Normal range of motion.  Neurological: She is alert and oriented to person, place, and time. She has normal reflexes.  Skin: Skin is warm.  Psychiatric: She has a normal mood and affect.    ED Course  Procedures (including critical care time)  Labs Reviewed  CBC WITH DIFFERENTIAL - Abnormal; Notable for the following:    RBC 3.84 (*)     Hemoglobin 11.6 (*)     HCT 33.7 (*)     All other components within normal limits  COMPREHENSIVE METABOLIC PANEL - Abnormal; Notable for the following:  Glucose, Bld 106 (*)     Total Bilirubin 0.2 (*)     All other components within normal limits  URINALYSIS, ROUTINE W REFLEX MICROSCOPIC - Abnormal; Notable for the following:    APPearance CLOUDY (*)     Leukocytes, UA MODERATE (*)     All other components within normal limits  URINE MICROSCOPIC-ADD ON - Abnormal; Notable for the following:    Squamous Epithelial / LPF FEW (*)     Bacteria, UA MANY (*)     All other components within normal limits  LIPASE, BLOOD   No results found.   1. Abdominal pain       MDM  Pt given iv fluids and pain medications,  No elevation in lipase,  Ct scan from March reviewed.  Pt does have a pancreatic cyst that is 9cm.  No evidence of acute pancreatitis.   Pt  given pain medication and rx for phenergan.   Pt advised to follow up with GI.   I gave referral to Dr. Philip Aspen, PA 04/22/12 2238  Lonia Skinner [place], [place] 04/22/12 2245

## 2012-04-22 NOTE — ED Notes (Signed)
Pt presents to ED today without indwelling IV catheter.  Not charted as removed from previous EPIC encounter.  

## 2012-04-22 NOTE — ED Notes (Signed)
MD at bedside.  Pt has family at bedside for ride home

## 2012-04-23 NOTE — ED Provider Notes (Signed)
Medical screening examination/treatment/procedure(s) were performed by non-physician practitioner and as supervising physician I was immediately available for consultation/collaboration.   Rolan Bucco, MD 04/23/12 [phone]

## 2012-05-27 ENCOUNTER — Emergency Department (HOSPITAL_BASED_OUTPATIENT_CLINIC_OR_DEPARTMENT_OTHER)
Admission: EM | Admit: 2012-05-27 | Discharge: 2012-05-27 | Disposition: A | Discharge status: Home / Self Care | Payer: Self-pay | Attending: Emergency Medicine | Admitting: Emergency Medicine

## 2012-05-27 ENCOUNTER — Encounter (HOSPITAL_BASED_OUTPATIENT_CLINIC_OR_DEPARTMENT_OTHER): Payer: Self-pay | Admitting: *Deleted

## 2012-05-27 DIAGNOSIS — G8929 Other chronic pain: Secondary | ICD-10-CM | POA: Insufficient documentation

## 2012-05-27 DIAGNOSIS — R109 Unspecified abdominal pain: Secondary | ICD-10-CM | POA: Insufficient documentation

## 2012-05-27 DIAGNOSIS — Z888 Allergy status to other drugs, medicaments and biological substances status: Secondary | ICD-10-CM | POA: Insufficient documentation

## 2012-05-27 LAB — COMPREHENSIVE METABOLIC PANEL
ALT: 16 U/L (ref 0–35)
AST: 16 U/L (ref 0–37)
Albumin: 3.7 g/dL (ref 3.5–5.2)
Calcium: 8.9 mg/dL (ref 8.4–10.5)
Chloride: 102 mEq/L (ref 96–112)
Creatinine, Ser: 0.6 mg/dL (ref 0.50–1.10)
Sodium: 138 mEq/L (ref 135–145)

## 2012-05-27 LAB — URINALYSIS, ROUTINE W REFLEX MICROSCOPIC
Glucose, UA: NEGATIVE mg/dL
Hgb urine dipstick: NEGATIVE
Protein, ur: NEGATIVE mg/dL
Specific Gravity, Urine: 1.022 (ref 1.005–1.030)
pH: 6.5 (ref 5.0–8.0)

## 2012-05-27 LAB — URINE MICROSCOPIC-ADD ON

## 2012-05-27 LAB — CBC WITH DIFFERENTIAL/PLATELET
Basophils Absolute: 0 10*3/uL (ref 0.0–0.1)
Basophils Relative: 0 % (ref 0–1)
HCT: 35.4 % — ABNORMAL LOW (ref 36.0–46.0)
MCHC: 33.6 g/dL (ref 30.0–36.0)
Monocytes Absolute: 0.7 10*3/uL (ref 0.1–1.0)
Neutro Abs: 6.9 10*3/uL (ref 1.7–7.7)
Platelets: 284 10*3/uL (ref 150–400)
RDW: 12 % (ref 11.5–15.5)
WBC: 10 10*3/uL (ref 4.0–10.5)

## 2012-05-27 MED ORDER — SODIUM CHLORIDE 0.9 % IV SOLN
Freq: Once | INTRAVENOUS | Status: AC
  Administered 2012-05-27: 17:00:00 via INTRAVENOUS

## 2012-05-27 MED ORDER — OXYCODONE-ACETAMINOPHEN 5-325 MG PO TABS: 2.0000 | ORAL_TABLET | ORAL | Status: AC | PRN

## 2012-05-27 MED ORDER — HYDROMORPHONE HCL PF 1 MG/ML IJ SOLN
1.0000 mg | Freq: Once | INTRAMUSCULAR | Status: AC
  Administered 2012-05-27: 1 mg via INTRAVENOUS
  Filled 2012-05-27: qty 1

## 2012-05-27 MED ORDER — PROMETHAZINE HCL 25 MG PO TABS: 25.0000 mg | ORAL_TABLET | Freq: Four times a day (QID) | ORAL | Status: DC | PRN

## 2012-05-27 MED ORDER — PROMETHAZINE HCL 25 MG/ML IJ SOLN
25.0000 mg | Freq: Once | INTRAMUSCULAR | Status: AC
  Administered 2012-05-27: 25 mg via INTRAVENOUS
  Filled 2012-05-27: qty 1

## 2012-05-27 NOTE — ED Provider Notes (Signed)
History     CSN: [id number]  Arrival date & time 05/27/12  1354   First MD Initiated Contact with Patient 05/27/12 1601      Chief Complaint  Patient presents with  . Abdominal Pain    (Consider location/radiation/quality/duration/timing/severity/associated sxs/prior treatment) Patient is a 41 y.o. female presenting with abdominal pain. The history is provided by the patient. No language interpreter was used.  Abdominal Pain The primary symptoms of the illness include abdominal pain. The current episode started 13 to 24 hours ago. The onset of the illness was gradual. The problem has been gradually worsening.  Associated with: hx of pancreatic cyst. The patient states that she believes she is currently not pregnant. The patient has not had a change in bowel habit. Significant associated medical issues do not include GERD.  Pt has a history of a pancreatic cyst.  Pt has had multiple episodes of pancreatitis.  Pt thinks she is having a flare up.  Pt reports she had an episode of sepsis in the past and worrys that it could be happening again  Past Medical History  Diagnosis Date  . Pancreatic cyst   . Pancreatitis     Past Surgical History  Procedure Date  . Cholecystectomy   . Tubal ligation     History reviewed. No pertinent family history.  History  Substance Use Topics  . Smoking status: Never Smoker   . Smokeless tobacco: Never Used  . Alcohol Use: No    OB History    Grav Para Term Preterm Abortions TAB SAB Ect Mult Living                  Review of Systems  Gastrointestinal: Positive for abdominal pain.  All other systems reviewed and are negative.    Allergies  Zofran and Reglan  Home Medications   Current Outpatient Rx  Name Route Sig Dispense Refill  . IBUPROFEN 200 MG PO TABS Oral Take 400 mg by mouth every 6 (six) hours as needed. For pain    . OXYCODONE-ACETAMINOPHEN 5-325 MG PO TABS Oral Take 1 tablet by mouth every 4 (four) hours as needed.  For pain.      BP 148/93  Pulse 92  Temp 98.5 F (36.9 C) (Oral)  Resp 20  Ht 5\' 7"  (1.702 m)  Wt 250 lb (113.399 kg)  BMI 39.16 kg/m2  SpO2 100%  LMP 03/26/2012  Physical Exam  Constitutional: She is oriented to person, place, and time. She appears well-developed and well-nourished.  HENT:  Head: Normocephalic and atraumatic.  Mouth/Throat: Oropharynx is clear and moist.  Eyes: Conjunctivae normal and EOM are normal. Pupils are equal, round, and reactive to light.  Neck: Normal range of motion.  Cardiovascular: Normal rate, regular rhythm and normal heart sounds.   Pulmonary/Chest: Effort normal and breath sounds normal.  Abdominal: Soft. She exhibits no distension. There is tenderness.  Musculoskeletal: Normal range of motion.  Neurological: She is alert and oriented to person, place, and time.  Skin: Skin is warm.  Psychiatric: She has a normal mood and affect.    ED Course  Procedures (including critical care time)  Labs Reviewed  URINALYSIS, ROUTINE W REFLEX MICROSCOPIC - Abnormal; Notable for the following:    APPearance CLOUDY (*)     Leukocytes, UA SMALL (*)     All other components within normal limits  URINE MICROSCOPIC-ADD ON - Abnormal; Notable for the following:    Squamous Epithelial / LPF MANY (*)  Bacteria, UA FEW (*)     All other components within normal limits  CBC WITH DIFFERENTIAL - Abnormal; Notable for the following:    Hemoglobin 11.9 (*)     HCT 35.4 (*)     All other components within normal limits  PREGNANCY, URINE  COMPREHENSIVE METABOLIC PANEL  LIPASE, BLOOD   No results found.   1. Chronic abdominal pain       MDM  Pt given fluids, pain medication and phenergan.   Pt reports she feels better.   Labs are normal.   Pt advised to follow up at [facility] as planned.  Pt given local referrals        Lonia Skinner [place], [place] 05/28/12 [phone]

## 2012-05-27 NOTE — ED Notes (Addendum)
Pt states she has a cyst on her pancreas and occasionally has these flare-ups. This one has lasted 4 days. "Septic" Nov 2012

## 2012-05-28 NOTE — ED Provider Notes (Signed)
Medical screening examination/treatment/procedure(s) were performed by non-physician practitioner and as supervising physician I was immediately available for consultation/collaboration.  Tobin Chad, MD 05/28/12 [phone]

## 2012-06-24 ENCOUNTER — Emergency Department (HOSPITAL_BASED_OUTPATIENT_CLINIC_OR_DEPARTMENT_OTHER): Payer: Self-pay

## 2012-06-24 ENCOUNTER — Encounter (HOSPITAL_BASED_OUTPATIENT_CLINIC_OR_DEPARTMENT_OTHER): Payer: Self-pay | Admitting: *Deleted

## 2012-06-24 ENCOUNTER — Emergency Department (HOSPITAL_BASED_OUTPATIENT_CLINIC_OR_DEPARTMENT_OTHER)
Admission: EM | Admit: 2012-06-24 | Discharge: 2012-06-24 | Disposition: A | Discharge status: Home / Self Care | Payer: Self-pay | Attending: Emergency Medicine | Admitting: Emergency Medicine

## 2012-06-24 DIAGNOSIS — R1084 Generalized abdominal pain: Secondary | ICD-10-CM | POA: Insufficient documentation

## 2012-06-24 DIAGNOSIS — R109 Unspecified abdominal pain: Secondary | ICD-10-CM

## 2012-06-24 DIAGNOSIS — R110 Nausea: Secondary | ICD-10-CM | POA: Insufficient documentation

## 2012-06-24 DIAGNOSIS — Z79899 Other long term (current) drug therapy: Secondary | ICD-10-CM | POA: Insufficient documentation

## 2012-06-24 LAB — CBC WITH DIFFERENTIAL/PLATELET
Basophils Relative: 1 % (ref 0–1)
Hemoglobin: 11.7 g/dL — ABNORMAL LOW (ref 12.0–15.0)
Lymphocytes Relative: 29 % (ref 12–46)
Lymphs Abs: 1.8 10*3/uL (ref 0.7–4.0)
Monocytes Relative: 9 % (ref 3–12)
Neutro Abs: 3.7 10*3/uL (ref 1.7–7.7)
Neutrophils Relative %: 59 % (ref 43–77)
RBC: 3.99 MIL/uL (ref 3.87–5.11)
WBC: 6.3 10*3/uL (ref 4.0–10.5)

## 2012-06-24 LAB — URINALYSIS, ROUTINE W REFLEX MICROSCOPIC
Nitrite: NEGATIVE
Specific Gravity, Urine: 1.026 (ref 1.005–1.030)
pH: 5.5 (ref 5.0–8.0)

## 2012-06-24 LAB — COMPREHENSIVE METABOLIC PANEL
Albumin: 3.3 g/dL — ABNORMAL LOW (ref 3.5–5.2)
Alkaline Phosphatase: 68 U/L (ref 39–117)
BUN: 14 mg/dL (ref 6–23)
Chloride: 105 mEq/L (ref 96–112)
Glucose, Bld: 112 mg/dL — ABNORMAL HIGH (ref 70–99)
Potassium: 3.6 mEq/L (ref 3.5–5.1)
Total Bilirubin: 0.3 mg/dL (ref 0.3–1.2)

## 2012-06-24 LAB — URINE MICROSCOPIC-ADD ON

## 2012-06-24 LAB — LIPASE, BLOOD: Lipase: 34 U/L (ref 11–59)

## 2012-06-24 LAB — PREGNANCY, URINE: Preg Test, Ur: NEGATIVE

## 2012-06-24 MED ORDER — IOHEXOL 300 MG/ML  SOLN
100.0000 mL | Freq: Once | INTRAMUSCULAR | Status: AC | PRN
  Administered 2012-06-24: 100 mL via INTRAVENOUS

## 2012-06-24 MED ORDER — PROMETHAZINE HCL 25 MG PO TABS: 25.0000 mg | ORAL_TABLET | Freq: Four times a day (QID) | ORAL | Status: DC | PRN

## 2012-06-24 MED ORDER — SODIUM CHLORIDE 0.9 % IV BOLUS (SEPSIS)
1000.0000 mL | Freq: Once | INTRAVENOUS | Status: AC
  Administered 2012-06-24: 1000 mL via INTRAVENOUS

## 2012-06-24 MED ORDER — PROMETHAZINE HCL 25 MG/ML IJ SOLN
25.0000 mg | Freq: Once | INTRAMUSCULAR | Status: AC
  Administered 2012-06-24: 25 mg via INTRAVENOUS
  Filled 2012-06-24: qty 1

## 2012-06-24 MED ORDER — OXYCODONE-ACETAMINOPHEN 5-325 MG PO TABS: 2.0000 | ORAL_TABLET | ORAL | Status: DC | PRN

## 2012-06-24 MED ORDER — HYDROMORPHONE HCL PF 1 MG/ML IJ SOLN
1.0000 mg | Freq: Once | INTRAMUSCULAR | Status: AC
  Administered 2012-06-24: 1 mg via INTRAVENOUS
  Filled 2012-06-24: qty 1

## 2012-06-24 MED ORDER — IOHEXOL 300 MG/ML  SOLN
25.0000 mL | INTRAMUSCULAR | Status: AC
  Administered 2012-06-24 (×2): 25 mL via ORAL

## 2012-06-24 NOTE — ED Provider Notes (Signed)
History     CSN: [id number]  Arrival date & time 06/24/12  1257   First MD Initiated Contact with Patient 06/24/12 1326      Chief Complaint  Patient presents with  . Abdominal Pain    (Consider location/radiation/quality/duration/timing/severity/associated sxs/prior treatment) HPI Comments: Patient is 41 year old female with a past medical history of pancreatitis and pancreatic cyst who presents with a 3 day history of generalized abdominal pain. The pain started gradually and progressively worsened over the past 3 days to severe pain. The patient reports the pain radiating to bilateral flanks. Patient reports associated nausea without vomiting. Patient has taken Phenergan at home for nausea which has helped. She has not tried anything for pain. She denies aggravating/alleviating factors. She denies fever, vomiting, diarrhea, chest pain, SOB, dysuria, abnormal vaginal discharge/bleeding. Patient reports this is her typical pain from her cyst.    Past Medical History  Diagnosis Date  . Pancreatic cyst   . Pancreatitis     Past Surgical History  Procedure Date  . Cholecystectomy   . Tubal ligation     History reviewed. No pertinent family history.  History  Substance Use Topics  . Smoking status: Never Smoker   . Smokeless tobacco: Never Used  . Alcohol Use: No    OB History    Grav Para Term Preterm Abortions TAB SAB Ect Mult Living                  Review of Systems  Gastrointestinal: Positive for nausea and abdominal pain.  All other systems reviewed and are negative.    Allergies  Zofran and Reglan  Home Medications   Current Outpatient Rx  Name Route Sig Dispense Refill  . IBUPROFEN 200 MG PO TABS Oral Take 400 mg by mouth every 6 (six) hours as needed. For pain    . OXYCODONE-ACETAMINOPHEN 5-325 MG PO TABS Oral Take 1 tablet by mouth every 4 (four) hours as needed. For pain.      BP 150/112  Pulse 90  Temp 97.9 F (36.6 C) (Oral)  Resp 20  Ht  5\' 7"  (1.702 m)  Wt 250 lb (113.399 kg)  BMI 39.16 kg/m2  SpO2 100%  LMP 05/27/2012  Physical Exam  Nursing note and vitals reviewed. Constitutional: She is oriented to person, place, and time. She appears well-developed and well-nourished. No distress.  HENT:  Head: Normocephalic and atraumatic.  Mouth/Throat: Oropharynx is clear and moist. No oropharyngeal exudate.  Eyes: Conjunctivae normal and EOM are normal. No scleral icterus.  Neck: Normal range of motion. Neck supple.  Cardiovascular: Normal rate and regular rhythm.  Exam reveals no gallop and no friction rub.   No murmur heard. Pulmonary/Chest: Effort normal and breath sounds normal. She has no wheezes. She has no rales. She exhibits no tenderness.  Abdominal: Soft. She exhibits no distension. There is tenderness. There is no rebound and no guarding.       Extreme tenderness to light palpation of entire abdomen.   Musculoskeletal: Normal range of motion.  Neurological: She is alert and oriented to person, place, and time. Coordination normal.       Speech is goal-oriented. Moves limbs without ataxia.   Skin: Skin is warm and dry. No rash noted.  Psychiatric: She has a normal mood and affect. Her behavior is normal.    ED Course  Procedures (including critical care time)  Labs Reviewed  URINALYSIS, ROUTINE W REFLEX MICROSCOPIC - Abnormal; Notable for the following:  APPearance CLOUDY (*)     Leukocytes, UA MODERATE (*)     All other components within normal limits  CBC WITH DIFFERENTIAL - Abnormal; Notable for the following:    Hemoglobin 11.7 (*)     HCT 34.4 (*)     All other components within normal limits  COMPREHENSIVE METABOLIC PANEL - Abnormal; Notable for the following:    Glucose, Bld 112 (*)     Albumin 3.3 (*)     All other components within normal limits  URINE MICROSCOPIC-ADD ON - Abnormal; Notable for the following:    Squamous Epithelial / LPF FEW (*)     Bacteria, UA MANY (*)     All other  components within normal limits  PREGNANCY, URINE  LIPASE, BLOOD   Ct Abdomen Pelvis W Contrast  06/24/2012  *RADIOLOGY REPORT*  Clinical Data: Abdominal pain.  Cramping and nausea.  CT ABDOMEN AND PELVIS WITH CONTRAST  Technique:  Multidetector CT imaging of the abdomen and pelvis was performed following the standard protocol during bolus administration of intravenous contrast.  Contrast: OMNIPAQUE IOHEXOL 300 MG/ML  SOLN  Comparison: 11/25/2011.  Findings: Lung Bases: Dependent atelectasis.  Liver:  Within normal limits.  Spleen:  Old granulomatous calcifications.  Gallbladder:  Surgically absent.  Common bile duct:  Normal.  Pancreas:  No peripancreatic inflammation.  Pseudocyst along the greater curvature of the stomach has nearly completely resolved. Only a tiny area of soft tissue is present in this region with central low attenuation.  Adrenal glands:  Normal.  Kidneys:  Normal enhancement.  Normal delayed excretion of contrast.  At least partially duplicated right renal collecting system.  Stomach:  No inflammatory changes.  Well opacified with contrast.  Small bowel:  Duodenum normal.  Jejunum and ileum also appear within normal limits.  Colon:   Normal appendix.  Normal colon.  Pelvic Genitourinary:  Normal.  Bones:  No aggressive osseous lesions.  Vasculature: Normal.  IMPRESSION: 1.  No acute abnormality. 2.  Old granulomatous disease of the spleen. 3.  Cholecystectomy. 4.  Near complete resolution of pancreatic pseudocyst along the greater curvature of the stomach.   Original Report Authenticated By: Andreas Newport, M.D.      1. Abdominal pain       MDM  2:22 PM Labs pending. Patient will have fluids, zofran, and dilaudid for pain. Urinalysis shows UTI. Patient is afebrile.   3:01 PM Labs unremarkable.   3:27 PM Patient expresses mild relief with medication and fluids but reports a different pain from her chronic pain. I will order CT abdomen/pelvis with contrast. If  negative, patient can be discharged with GI referral. I have reordered fluids, phenergan, and dilaudid.   5:49 PM Patient's CT scan negative for acute process and shows almost complete resolution of pancreatic pseudocyst. Patient reports relief of pain and nausea. I will discharge her with pain and nausea medication and a GI follow up. No further evaluation needed at this time.   Emilia Beck, PA-C 06/25/12 [phone]

## 2012-06-24 NOTE — ED Notes (Signed)
Pt states she has a hx of a cyst on her pancreas and began having cramping and nausea this a.m.

## 2012-06-25 NOTE — ED Provider Notes (Signed)
Medical screening examination/treatment/procedure(s) were performed by non-physician practitioner and as supervising physician I was immediately available for consultation/collaboration.   Celene Kras, MD 06/25/12 [phone]

## 2012-08-13 ENCOUNTER — Encounter (HOSPITAL_BASED_OUTPATIENT_CLINIC_OR_DEPARTMENT_OTHER): Payer: Self-pay | Admitting: *Deleted

## 2012-08-13 ENCOUNTER — Emergency Department (HOSPITAL_BASED_OUTPATIENT_CLINIC_OR_DEPARTMENT_OTHER)
Admission: EM | Admit: 2012-08-13 | Discharge: 2012-08-13 | Disposition: A | Discharge status: Home / Self Care | Payer: Self-pay | Attending: Emergency Medicine | Admitting: Emergency Medicine

## 2012-08-13 DIAGNOSIS — R109 Unspecified abdominal pain: Secondary | ICD-10-CM | POA: Insufficient documentation

## 2012-08-13 DIAGNOSIS — R350 Frequency of micturition: Secondary | ICD-10-CM | POA: Insufficient documentation

## 2012-08-13 DIAGNOSIS — R110 Nausea: Secondary | ICD-10-CM | POA: Insufficient documentation

## 2012-08-13 DIAGNOSIS — Z8719 Personal history of other diseases of the digestive system: Secondary | ICD-10-CM | POA: Insufficient documentation

## 2012-08-13 DIAGNOSIS — R319 Hematuria, unspecified: Secondary | ICD-10-CM | POA: Insufficient documentation

## 2012-08-13 DIAGNOSIS — R300 Dysuria: Secondary | ICD-10-CM | POA: Insufficient documentation

## 2012-08-13 LAB — CBC WITH DIFFERENTIAL/PLATELET
Eosinophils Absolute: 0.2 10*3/uL (ref 0.0–0.7)
Eosinophils Relative: 2 % (ref 0–5)
HCT: 31.3 % — ABNORMAL LOW (ref 36.0–46.0)
Lymphs Abs: 2.4 10*3/uL (ref 0.7–4.0)
MCH: 28.7 pg (ref 26.0–34.0)
MCV: 86.2 fL (ref 78.0–100.0)
Monocytes Absolute: 0.6 10*3/uL (ref 0.1–1.0)
Platelets: 246 10*3/uL (ref 150–400)
RBC: 3.63 MIL/uL — ABNORMAL LOW (ref 3.87–5.11)
RDW: 12.6 % (ref 11.5–15.5)

## 2012-08-13 LAB — COMPREHENSIVE METABOLIC PANEL
ALT: 20 U/L (ref 0–35)
CO2: 26 mEq/L (ref 19–32)
Calcium: 8.6 mg/dL (ref 8.4–10.5)
Creatinine, Ser: 0.6 mg/dL (ref 0.50–1.10)
GFR calc Af Amer: >90 mL/min (ref >90)
GFR calc non Af Amer: >90 mL/min (ref >90)
Glucose, Bld: 110 mg/dL — ABNORMAL HIGH (ref 70–99)
Sodium: 138 mEq/L (ref 135–145)
Total Protein: 6.9 g/dL (ref 6.0–8.3)

## 2012-08-13 LAB — URINE MICROSCOPIC-ADD ON

## 2012-08-13 LAB — URINALYSIS, ROUTINE W REFLEX MICROSCOPIC
Bilirubin Urine: NEGATIVE
Ketones, ur: NEGATIVE mg/dL
Nitrite: NEGATIVE
Urobilinogen, UA: 1 mg/dL (ref 0.0–1.0)

## 2012-08-13 LAB — LIPASE, BLOOD: Lipase: 30 U/L (ref 11–59)

## 2012-08-13 MED ORDER — OXYCODONE-ACETAMINOPHEN 5-325 MG PO TABS: 1.0000 | ORAL_TABLET | Freq: Four times a day (QID) | ORAL | Status: DC | PRN

## 2012-08-13 MED ORDER — HYDROMORPHONE HCL PF 1 MG/ML IJ SOLN
1.0000 mg | Freq: Once | INTRAMUSCULAR | Status: AC
  Administered 2012-08-13: 1 mg via INTRAVENOUS
  Filled 2012-08-13: qty 1

## 2012-08-13 MED ORDER — PROMETHAZINE HCL 25 MG PO TABS: 25.0000 mg | ORAL_TABLET | Freq: Four times a day (QID) | ORAL | Status: DC | PRN

## 2012-08-13 MED ORDER — PROMETHAZINE HCL 25 MG/ML IJ SOLN
25.0000 mg | Freq: Once | INTRAMUSCULAR | Status: AC
  Administered 2012-08-13: 25 mg via INTRAVENOUS
  Filled 2012-08-13: qty 1

## 2012-08-13 MED ORDER — SODIUM CHLORIDE 0.9 % IV BOLUS (SEPSIS)
1000.0000 mL | Freq: Once | INTRAVENOUS | Status: AC
  Administered 2012-08-13: 1000 mL via INTRAVENOUS

## 2012-08-13 NOTE — ED Notes (Signed)
Pt c/o left flank pain x 2 days.

## 2012-08-13 NOTE — ED Provider Notes (Signed)
History   This chart was scribed for Teresa B. Bernette Mayers, MD by Teresa Valenzuela, ED Scribe. This patient was seen in room MH04/MH04 and the patient's care was started at 15:19.   CSN: [id number]  Arrival date & time 08/13/12  1519   First MD Initiated Contact with Patient 08/13/12 1556      Chief Complaint  Patient presents with  . Flank Pain     The history is provided by the patient. No language interpreter was used.   Teresa Valenzuela is a 41 y.o. female who presents to the Emergency Department complaining of worsening, intermittent sharp pain to her left flank which started 3 days ago. She reports that this pain feels like her chronic abdominal pain due to pancreatic cysts, although it is in a different location. She reports nausea, increased frequency, dysuria and hematuria. She denies fever, emesis, diarrhea, constipation or vaginal discharge. She had a previous tubal ligation surgery and denies possibility of pregnancy. She does not have a PCP and has been seen in the ED for this before.   Past Medical History  Diagnosis Date  . Pancreatic cyst   . Pancreatitis     Past Surgical History  Procedure Date  . Cholecystectomy   . Tubal ligation     History reviewed. No pertinent family history.  History  Substance Use Topics  . Smoking status: Never Smoker   . Smokeless tobacco: Never Used  . Alcohol Use: No    OB History    Grav Para Term Preterm Abortions TAB SAB Ect Mult Living                  Review of Systems A complete 10 system review of systems was obtained and all systems are negative except as noted in the HPI and PMH.   Allergies  Zofran and Reglan  Home Medications   Current Outpatient Rx  Name  Route  Sig  Dispense  Refill  . IBUPROFEN 200 MG PO TABS   Oral   Take 400 mg by mouth every 6 (six) hours as needed. For pain         . OXYCODONE-ACETAMINOPHEN 5-325 MG PO TABS   Oral   Take 1 tablet by mouth every 4 (four) hours as needed. For  pain.         . OXYCODONE-ACETAMINOPHEN 5-325 MG PO TABS   Oral   Take 2 tablets by mouth every 4 (four) hours as needed for pain.   15 tablet   0   . PROMETHAZINE HCL 25 MG PO TABS   Oral   Take 1 tablet (25 mg total) by mouth every 6 (six) hours as needed for nausea.   12 tablet   0     Triage Vitals: BP 154/99  Pulse 86  Temp 98.2 F (36.8 C) (Oral)  Resp 16  Ht 5\' 8"  (1.727 m)  Wt 250 lb (113.399 kg)  BMI 38.01 kg/m2  SpO2 100%  LMP 08/09/2012  Physical Exam  Nursing note and vitals reviewed. Constitutional: She is oriented to person, place, and time. She appears well-developed and well-nourished.  HENT:  Head: Normocephalic and atraumatic.  Eyes: EOM are normal. Pupils are equal, round, and reactive to light.  Neck: Normal range of motion. Neck supple.  Cardiovascular: Normal rate, normal heart sounds and intact distal pulses.   Pulmonary/Chest: Effort normal and breath sounds normal.  Abdominal: Bowel sounds are normal. She exhibits no distension and no mass. There is tenderness.  There is no rebound and no guarding.       Tender in epigastric region, LUQ and left flank.  Musculoskeletal: Normal range of motion. She exhibits no edema and no tenderness.  Neurological: She is alert and oriented to person, place, and time. She has normal strength. No cranial nerve deficit or sensory deficit.  Skin: Skin is warm and dry. No rash noted.  Psychiatric: She has a normal mood and affect.    ED Course  Procedures (including critical care time)  DIAGNOSTIC STUDIES: Oxygen Saturation is 100% on room air, normal by my interpretation.    COORDINATION OF CARE: 4:00 PM Discussed treatment plan which includes lab work with pt at bedside and pt agreed to plan.    Labs Reviewed  URINALYSIS, ROUTINE W REFLEX MICROSCOPIC - Abnormal; Notable for the following:    Specific Gravity, Urine 1.031 (*)     Hgb urine dipstick MODERATE (*)     All other components within normal  limits  CBC WITH DIFFERENTIAL - Abnormal; Notable for the following:    RBC 3.63 (*)     Hemoglobin 10.4 (*)     HCT 31.3 (*)     All other components within normal limits  COMPREHENSIVE METABOLIC PANEL - Abnormal; Notable for the following:    Glucose, Bld 110 (*)     Albumin 3.4 (*)     Total Bilirubin 0.1 (*)     All other components within normal limits  URINE MICROSCOPIC-ADD ON - Abnormal; Notable for the following:    Squamous Epithelial / LPF FEW (*)     Bacteria, UA FEW (*)     All other components within normal limits  PREGNANCY, URINE  LIPASE, BLOOD   No results found.   1. Abdominal pain       MDM  Pt with exacerbation of chronic abdominal pain she related to previously diagnosed pancreatitic cyst. Labs unchanged from baseline, pain improved. Advised outpatient followup.   I personally performed the services described in this documentation, which was scribed in my presence. The recorded information has been reviewed and is accurate.       Teresa B. Bernette Mayers, MD 08/13/12 1700

## 2012-08-13 NOTE — ED Notes (Signed)
Family at bedside. 

## 2012-09-08 ENCOUNTER — Emergency Department (HOSPITAL_BASED_OUTPATIENT_CLINIC_OR_DEPARTMENT_OTHER): Payer: Self-pay

## 2012-09-08 ENCOUNTER — Encounter (HOSPITAL_BASED_OUTPATIENT_CLINIC_OR_DEPARTMENT_OTHER): Payer: Self-pay

## 2012-09-08 ENCOUNTER — Emergency Department (HOSPITAL_BASED_OUTPATIENT_CLINIC_OR_DEPARTMENT_OTHER)
Admission: EM | Admit: 2012-09-08 | Discharge: 2012-09-08 | Disposition: A | Discharge status: Home / Self Care | Payer: Self-pay | Attending: Emergency Medicine | Admitting: Emergency Medicine

## 2012-09-08 DIAGNOSIS — Z79899 Other long term (current) drug therapy: Secondary | ICD-10-CM | POA: Insufficient documentation

## 2012-09-08 DIAGNOSIS — K859 Acute pancreatitis without necrosis or infection, unspecified: Secondary | ICD-10-CM | POA: Insufficient documentation

## 2012-09-08 DIAGNOSIS — R112 Nausea with vomiting, unspecified: Secondary | ICD-10-CM | POA: Insufficient documentation

## 2012-09-08 DIAGNOSIS — N83209 Unspecified ovarian cyst, unspecified side: Secondary | ICD-10-CM | POA: Insufficient documentation

## 2012-09-08 LAB — COMPREHENSIVE METABOLIC PANEL
ALT: 94 U/L — ABNORMAL HIGH (ref 0–35)
Albumin: 3.6 g/dL (ref 3.5–5.2)
Alkaline Phosphatase: 121 U/L — ABNORMAL HIGH (ref 39–117)
Calcium: 8.4 mg/dL (ref 8.4–10.5)
GFR calc Af Amer: >90 mL/min (ref >90)
Glucose, Bld: 130 mg/dL — ABNORMAL HIGH (ref 70–99)
Potassium: 3.8 mEq/L (ref 3.5–5.1)
Sodium: 137 mEq/L (ref 135–145)
Total Protein: 7.4 g/dL (ref 6.0–8.3)

## 2012-09-08 LAB — LIPASE, BLOOD: Lipase: 103 U/L — ABNORMAL HIGH (ref 11–59)

## 2012-09-08 LAB — CBC WITH DIFFERENTIAL/PLATELET
Basophils Relative: 0 % (ref 0–1)
Eosinophils Absolute: 0.1 10*3/uL (ref 0.0–0.7)
Eosinophils Relative: 1 % (ref 0–5)
Lymphs Abs: 0.8 10*3/uL (ref 0.7–4.0)
MCH: 29.1 pg (ref 26.0–34.0)
MCHC: 33.4 g/dL (ref 30.0–36.0)
MCV: 87 fL (ref 78.0–100.0)
Neutrophils Relative %: 84 % — ABNORMAL HIGH (ref 43–77)
Platelets: 249 10*3/uL (ref 150–400)
RDW: 12.5 % (ref 11.5–15.5)

## 2012-09-08 LAB — URINALYSIS, ROUTINE W REFLEX MICROSCOPIC
Nitrite: NEGATIVE
Specific Gravity, Urine: 1.015 (ref 1.005–1.030)
Urobilinogen, UA: 1 mg/dL (ref 0.0–1.0)
pH: 8 (ref 5.0–8.0)

## 2012-09-08 LAB — PREGNANCY, URINE: Preg Test, Ur: NEGATIVE

## 2012-09-08 LAB — URINE MICROSCOPIC-ADD ON

## 2012-09-08 MED ORDER — HYDROMORPHONE HCL PF 1 MG/ML IJ SOLN
1.0000 mg | Freq: Once | INTRAMUSCULAR | Status: AC
  Administered 2012-09-08: 1 mg via INTRAVENOUS
  Filled 2012-09-08: qty 1

## 2012-09-08 MED ORDER — OXYCODONE-ACETAMINOPHEN 5-325 MG PO TABS: 1.0000 | ORAL_TABLET | Freq: Four times a day (QID) | ORAL | Status: DC | PRN

## 2012-09-08 MED ORDER — PROMETHAZINE HCL 25 MG/ML IJ SOLN
25.0000 mg | Freq: Once | INTRAMUSCULAR | Status: AC
  Administered 2012-09-08: 25 mg via INTRAVENOUS
  Filled 2012-09-08: qty 1

## 2012-09-08 MED ORDER — IOHEXOL 300 MG/ML  SOLN
100.0000 mL | Freq: Once | INTRAMUSCULAR | Status: AC | PRN
  Administered 2012-09-08: 100 mL via INTRAVENOUS

## 2012-09-08 MED ORDER — SODIUM CHLORIDE 0.9 % IV BOLUS (SEPSIS)
1000.0000 mL | Freq: Once | INTRAVENOUS | Status: AC
  Administered 2012-09-08: 1000 mL via INTRAVENOUS

## 2012-09-08 MED ORDER — PROMETHAZINE HCL 25 MG RE SUPP: 25.0000 mg | Freq: Four times a day (QID) | RECTAL | Status: DC | PRN

## 2012-09-08 NOTE — ED Notes (Signed)
Pt having vomiting episodes all day, sts started having abd pain around 2am.

## 2012-09-08 NOTE — ED Provider Notes (Signed)
History     CSN: [id number]  Arrival date & time 09/08/12  0244   First MD Initiated Contact with Patient 09/08/12 0324      No chief complaint on file.   (Consider location/radiation/quality/duration/timing/severity/associated sxs/prior treatment) Patient is a 41 y.o. female presenting with abdominal pain. The history is provided by the patient.  Abdominal Pain The primary symptoms of the illness include abdominal pain, nausea and vomiting. The primary symptoms of the illness do not include fever or shortness of breath. The current episode started yesterday. The onset of the illness was gradual. The problem has not changed since onset. The abdominal pain began yesterday. The pain came on gradually. The abdominal pain has been unchanged since its onset. The abdominal pain is located in the LUQ. The abdominal pain does not radiate. The severity of the abdominal pain is 10/10. The abdominal pain is relieved by nothing. The abdominal pain is exacerbated by vomiting.  Nausea began yesterday.  The vomiting began yesterday. Vomiting occurs 2 to 5 times per day. The emesis contains stomach contents.  Symptoms associated with the illness do not include chills. Significant associated medical issues do not include inflammatory bowel disease or gallstones.    Past Medical History  Diagnosis Date  . Pancreatic cyst   . Pancreatitis     Past Surgical History  Procedure Date  . Cholecystectomy   . Tubal ligation     History reviewed. No pertinent family history.  History  Substance Use Topics  . Smoking status: Never Smoker   . Smokeless tobacco: Never Used  . Alcohol Use: No    OB History    Grav Para Term Preterm Abortions TAB SAB Ect Mult Living                  Review of Systems  Constitutional: Negative for fever and chills.  Respiratory: Negative for shortness of breath.   Cardiovascular: Negative for chest pain.  Gastrointestinal: Positive for nausea, vomiting and  abdominal pain.  All other systems reviewed and are negative.    Allergies  Zofran and Reglan  Home Medications   Current Outpatient Rx  Name  Route  Sig  Dispense  Refill  . OXYCODONE-ACETAMINOPHEN 5-325 MG PO TABS   Oral   Take 1 tablet by mouth every 4 (four) hours as needed. For pain.         . OXYCODONE-ACETAMINOPHEN 5-325 MG PO TABS   Oral   Take 1-2 tablets by mouth every 6 (six) hours as needed for pain.   20 tablet   0   . PROMETHAZINE HCL 25 MG PO TABS   Oral   Take 1 tablet (25 mg total) by mouth every 6 (six) hours as needed for nausea.   12 tablet   0   . PROMETHAZINE HCL 25 MG PO TABS   Oral   Take 1 tablet (25 mg total) by mouth every 6 (six) hours as needed for nausea.   20 tablet   0     BP 146/95  Pulse 99  Temp 98.3 F (36.8 C) (Oral)  Resp 20  Ht 5\' 7"  (1.702 m)  Wt 260 lb (117.935 kg)  BMI 40.72 kg/m2  SpO2 96%  LMP 08/09/2012  Physical Exam  Constitutional: She is oriented to person, place, and time. She appears well-developed and well-nourished. No distress.  HENT:  Head: Normocephalic and atraumatic.  Mouth/Throat: Oropharynx is clear and moist.  Eyes: Conjunctivae normal are normal. Pupils are equal, round,  and reactive to light.  Neck: Normal range of motion. Neck supple.  Cardiovascular: Normal rate and regular rhythm.   Pulmonary/Chest: Effort normal and breath sounds normal. She has no wheezes. She has no rales.  Abdominal: Soft. Bowel sounds are normal. There is tenderness in the left upper quadrant. There is no rebound and no guarding.  Musculoskeletal: Normal range of motion.  Neurological: She is alert and oriented to person, place, and time.  Skin: Skin is warm and dry.  Psychiatric: She has a normal mood and affect.    ED Course  Procedures (including critical care time)  Labs Reviewed  CBC WITH DIFFERENTIAL - Abnormal; Notable for the following:    Neutrophils Relative 84 (*)     Neutro Abs 8.2 (*)      Lymphocytes Relative 9 (*)     All other components within normal limits  COMPREHENSIVE METABOLIC PANEL - Abnormal; Notable for the following:    Glucose, Bld 130 (*)     AST 142 (*)     ALT 94 (*)     Alkaline Phosphatase 121 (*)     All other components within normal limits  LIPASE, BLOOD - Abnormal; Notable for the following:    Lipase 103 (*)     All other components within normal limits  URINALYSIS, ROUTINE W REFLEX MICROSCOPIC - Abnormal; Notable for the following:    APPearance CLOUDY (*)     Hgb urine dipstick LARGE (*)     Leukocytes, UA TRACE (*)     All other components within normal limits  URINE MICROSCOPIC-ADD ON - Abnormal; Notable for the following:    Squamous Epithelial / LPF FEW (*)     Bacteria, UA FEW (*)     All other components within normal limits  PREGNANCY, URINE  URINE CULTURE   No results found.   No diagnosis found.    MDM  Pancreatitis.  Will need GI follow up.  Return for intractable vomiting pain or any concerns.  Follow up with your GYN for pelvic ultrasound to better characterize ovarian cyst seen on CT.  Patient and husband verbalize understanding and agree to follow up        Lakea Mittelman Smitty Cords, MD 09/08/12 [phone]

## 2012-09-09 LAB — URINE CULTURE: Colony Count: >=100000

## 2012-09-13 ENCOUNTER — Emergency Department (HOSPITAL_BASED_OUTPATIENT_CLINIC_OR_DEPARTMENT_OTHER)
Admission: EM | Admit: 2012-09-13 | Discharge: 2012-09-13 | Disposition: A | Discharge status: Home / Self Care | Payer: Self-pay | Attending: Emergency Medicine | Admitting: Emergency Medicine

## 2012-09-13 ENCOUNTER — Encounter (HOSPITAL_BASED_OUTPATIENT_CLINIC_OR_DEPARTMENT_OTHER): Payer: Self-pay

## 2012-09-13 DIAGNOSIS — Z8719 Personal history of other diseases of the digestive system: Secondary | ICD-10-CM | POA: Insufficient documentation

## 2012-09-13 DIAGNOSIS — R51 Headache: Secondary | ICD-10-CM | POA: Insufficient documentation

## 2012-09-13 DIAGNOSIS — Z791 Long term (current) use of non-steroidal anti-inflammatories (NSAID): Secondary | ICD-10-CM | POA: Insufficient documentation

## 2012-09-13 DIAGNOSIS — R109 Unspecified abdominal pain: Secondary | ICD-10-CM

## 2012-09-13 DIAGNOSIS — R1033 Periumbilical pain: Secondary | ICD-10-CM | POA: Insufficient documentation

## 2012-09-13 DIAGNOSIS — R42 Dizziness and giddiness: Secondary | ICD-10-CM | POA: Insufficient documentation

## 2012-09-13 DIAGNOSIS — Z79899 Other long term (current) drug therapy: Secondary | ICD-10-CM | POA: Insufficient documentation

## 2012-09-13 DIAGNOSIS — R112 Nausea with vomiting, unspecified: Secondary | ICD-10-CM | POA: Insufficient documentation

## 2012-09-13 DIAGNOSIS — R197 Diarrhea, unspecified: Secondary | ICD-10-CM | POA: Insufficient documentation

## 2012-09-13 LAB — COMPREHENSIVE METABOLIC PANEL
ALT: 56 U/L — ABNORMAL HIGH (ref 0–35)
AST: 27 U/L (ref 0–37)
Albumin: 3.7 g/dL (ref 3.5–5.2)
Calcium: 8.6 mg/dL (ref 8.4–10.5)
GFR calc Af Amer: >90 mL/min (ref >90)
Glucose, Bld: 110 mg/dL — ABNORMAL HIGH (ref 70–99)
Sodium: 138 mEq/L (ref 135–145)
Total Protein: 7.2 g/dL (ref 6.0–8.3)

## 2012-09-13 LAB — CBC WITH DIFFERENTIAL/PLATELET
Basophils Absolute: 0 10*3/uL (ref 0.0–0.1)
Basophils Relative: 0 % (ref 0–1)
Eosinophils Absolute: 0.2 10*3/uL (ref 0.0–0.7)
Lymphs Abs: 2.9 10*3/uL (ref 0.7–4.0)
MCH: 29 pg (ref 26.0–34.0)
MCHC: 33.7 g/dL (ref 30.0–36.0)
Neutrophils Relative %: 56 % (ref 43–77)
Platelets: 265 10*3/uL (ref 150–400)
RBC: 4.11 MIL/uL (ref 3.87–5.11)

## 2012-09-13 LAB — URINALYSIS, ROUTINE W REFLEX MICROSCOPIC
Nitrite: NEGATIVE
Protein, ur: NEGATIVE mg/dL
Specific Gravity, Urine: 1.018 (ref 1.005–1.030)
Urobilinogen, UA: 1 mg/dL (ref 0.0–1.0)

## 2012-09-13 LAB — URINE MICROSCOPIC-ADD ON

## 2012-09-13 LAB — LIPASE, BLOOD: Lipase: 37 U/L (ref 11–59)

## 2012-09-13 MED ORDER — LORAZEPAM 2 MG/ML IJ SOLN
1.0000 mg | Freq: Once | INTRAMUSCULAR | Status: AC
  Administered 2012-09-13: 18:00:00 via INTRAVENOUS
  Filled 2012-09-13: qty 1

## 2012-09-13 MED ORDER — POTASSIUM CHLORIDE CRYS ER 20 MEQ PO TBCR
40.0000 meq | EXTENDED_RELEASE_TABLET | Freq: Once | ORAL | Status: AC
  Administered 2012-09-13: 40 meq via ORAL
  Filled 2012-09-13: qty 2

## 2012-09-13 MED ORDER — SODIUM CHLORIDE 0.9 % IV BOLUS (SEPSIS)
1000.0000 mL | Freq: Once | INTRAVENOUS | Status: AC
  Administered 2012-09-13: 1000 mL via INTRAVENOUS

## 2012-09-13 NOTE — ED Notes (Signed)
MD at bedside. 

## 2012-09-13 NOTE — ED Provider Notes (Signed)
History     CSN: [id number]  Arrival date & time 09/13/12  1440   First MD Initiated Contact with Patient 09/13/12 1722      Chief Complaint  Patient presents with  . Abdominal Pain  . Dizziness     HPI  Patient presents with ongoing dizziness and abdominal pain.  Symptoms actually began approximately 4 days ago, insidiously.  Since onset she has had persistent symptoms, though the severity of waxing and waning, both with the dizziness any abdominal pain.  Abdominal pain focally about the periumbilical area, nonradiating, sore. The dizziness is not clearly caused by, nor relieved by anything.  No concurrent vomiting, diarrhea. The patient states that these symptoms have been present and accommodation multiple prior times, typically associated with intermittent pancreatitis. The patient was seen here 4 days ago, for abdominal pain diagnosis pancreatitis.  She notes that since discharge she continues to have abdominal pain.      Past Medical History  Diagnosis Date  . Pancreatic cyst   . Pancreatitis     Past Surgical History  Procedure Date  . Cholecystectomy   . Tubal ligation     No family history on file.  History  Substance Use Topics  . Smoking status: Never Smoker   . Smokeless tobacco: Never Used  . Alcohol Use: No    OB History    Grav Para Term Preterm Abortions TAB SAB Ect Mult Living                  Review of Systems  Constitutional:       Per HPI, otherwise negative  HENT:       Per HPI, otherwise negative  Eyes: Negative.   Respiratory:       Per HPI, otherwise negative  Cardiovascular:       Per HPI, otherwise negative  Gastrointestinal: Positive for nausea, vomiting and diarrhea.  Genitourinary: Negative.   Musculoskeletal:       Per HPI, otherwise negative  Skin: Negative.   Neurological: Positive for headaches. Negative for syncope.    Allergies  Zofran and Reglan  Home Medications   Current Outpatient Rx  Name  Route  Sig   Dispense  Refill  . METHADONE HCL PO   Oral   Take 30 mg by mouth.         . OXYCODONE-ACETAMINOPHEN 5-325 MG PO TABS   Oral   Take 1 tablet by mouth every 6 (six) hours as needed for pain.   11 tablet   0   . OXYCODONE-ACETAMINOPHEN 5-325 MG PO TABS   Oral   Take 1 tablet by mouth every 4 (four) hours as needed. For pain.         . OXYCODONE-ACETAMINOPHEN 5-325 MG PO TABS   Oral   Take 1-2 tablets by mouth every 6 (six) hours as needed for pain.   20 tablet   0   . PROMETHAZINE HCL 25 MG RE SUPP   Rectal   Place 1 suppository (25 mg total) rectally every 6 (six) hours as needed for nausea.   12 each   0   . PROMETHAZINE HCL 25 MG PO TABS   Oral   Take 1 tablet (25 mg total) by mouth every 6 (six) hours as needed for nausea.   12 tablet   0   . PROMETHAZINE HCL 25 MG PO TABS   Oral   Take 1 tablet (25 mg total) by mouth every 6 (six) hours as  needed for nausea.   20 tablet   0     BP 171/106  Pulse 82  Temp 98.8 F (37.1 C) (Oral)  Resp 16  Ht 5\' 7"  (1.702 m)  Wt 260 lb (117.935 kg)  BMI 40.72 kg/m2  SpO2 98%  LMP 09/08/2012  Physical Exam  Nursing note and vitals reviewed. Constitutional: She is oriented to person, place, and time. She appears well-developed and well-nourished. No distress.  HENT:  Head: Normocephalic and atraumatic.  Eyes: Conjunctivae normal and EOM are normal.  Cardiovascular: Normal rate and regular rhythm.   Pulmonary/Chest: Effort normal and breath sounds normal. No stridor. No respiratory distress.  Abdominal: She exhibits no distension.  Musculoskeletal: She exhibits no edema.  Neurological: She is alert and oriented to person, place, and time. No cranial nerve deficit.  Skin: Skin is warm and dry.  Psychiatric: She has a normal mood and affect.    ED Course  Procedures (including critical care time)  Labs Reviewed  URINALYSIS, ROUTINE W REFLEX MICROSCOPIC - Abnormal; Notable for the following:    Hgb urine dipstick  TRACE (*)     Leukocytes, UA MODERATE (*)     All other components within normal limits  COMPREHENSIVE METABOLIC PANEL - Abnormal; Notable for the following:    Potassium 3.1 (*)     Glucose, Bld 110 (*)     ALT 56 (*)     Total Bilirubin 0.2 (*)     All other components within normal limits  CBC WITH DIFFERENTIAL - Abnormal; Notable for the following:    Hemoglobin 11.9 (*)     HCT 35.3 (*)     All other components within normal limits  URINE MICROSCOPIC-ADD ON - Abnormal; Notable for the following:    Squamous Epithelial / LPF MANY (*)     Bacteria, UA MANY (*)     All other components within normal limits  LIPASE, BLOOD  URINE CULTURE   No results found.   No diagnosis found.   After my initial discussion I reviewed the patient's chart.  I discussed with her the need for additional evaluation as an outpatient, specifically with a gastroenterologist, and a primary care physician.  7:24 PM The patient states that she is feeling better. MDM  The patient presents with concerns of ongoing dizziness, abdominal pain.  On exam she is in no distress.  Patient is awake, alert, appropriate interactive, with no neurologic deficiencies, and little suspicion for acute intracranial pathology.  The patient's history of recurrent abdominal pain, recurrent pancreatitis necessitates further evaluation with a gastroenterologist.  I discussed this at length with the patient.  Absent distress, neurologic findings, abnormal vital signs, she is appropriate for continued evaluation as an outpatient.       Gerhard Munch, MD 09/13/12 1925

## 2012-09-13 NOTE — ED Notes (Signed)
Pt reports dizziness x 2 days, worsening today.  She aslo reports abdominal pain.  She was seen in ED Saturday and dx'd pancreatitis.

## 2012-09-15 LAB — URINE CULTURE: Colony Count: 25000

## 2013-01-24 ENCOUNTER — Emergency Department (HOSPITAL_BASED_OUTPATIENT_CLINIC_OR_DEPARTMENT_OTHER)
Admission: EM | Admit: 2013-01-24 | Discharge: 2013-01-24 | Disposition: A | Discharge status: Home / Self Care | Payer: Self-pay | Attending: Emergency Medicine | Admitting: Emergency Medicine

## 2013-01-24 ENCOUNTER — Emergency Department (HOSPITAL_BASED_OUTPATIENT_CLINIC_OR_DEPARTMENT_OTHER): Payer: Self-pay

## 2013-01-24 ENCOUNTER — Encounter (HOSPITAL_BASED_OUTPATIENT_CLINIC_OR_DEPARTMENT_OTHER): Payer: Self-pay

## 2013-01-24 DIAGNOSIS — R300 Dysuria: Secondary | ICD-10-CM | POA: Insufficient documentation

## 2013-01-24 DIAGNOSIS — M7989 Other specified soft tissue disorders: Secondary | ICD-10-CM | POA: Insufficient documentation

## 2013-01-24 DIAGNOSIS — Z3202 Encounter for pregnancy test, result negative: Secondary | ICD-10-CM | POA: Insufficient documentation

## 2013-01-24 DIAGNOSIS — R110 Nausea: Secondary | ICD-10-CM | POA: Insufficient documentation

## 2013-01-24 DIAGNOSIS — Z8742 Personal history of other diseases of the female genital tract: Secondary | ICD-10-CM | POA: Insufficient documentation

## 2013-01-24 DIAGNOSIS — Z9851 Tubal ligation status: Secondary | ICD-10-CM | POA: Insufficient documentation

## 2013-01-24 DIAGNOSIS — Z8719 Personal history of other diseases of the digestive system: Secondary | ICD-10-CM | POA: Insufficient documentation

## 2013-01-24 DIAGNOSIS — N390 Urinary tract infection, site not specified: Secondary | ICD-10-CM | POA: Insufficient documentation

## 2013-01-24 DIAGNOSIS — Z9089 Acquired absence of other organs: Secondary | ICD-10-CM | POA: Insufficient documentation

## 2013-01-24 DIAGNOSIS — R197 Diarrhea, unspecified: Secondary | ICD-10-CM | POA: Insufficient documentation

## 2013-01-24 HISTORY — DX: Unspecified ovarian cyst, unspecified side: N83.209

## 2013-01-24 LAB — COMPREHENSIVE METABOLIC PANEL
AST: 19 U/L (ref 0–37)
Albumin: 3.3 g/dL — ABNORMAL LOW (ref 3.5–5.2)
Chloride: 104 mEq/L (ref 96–112)
Creatinine, Ser: 0.7 mg/dL (ref 0.50–1.10)
Total Bilirubin: 0.2 mg/dL — ABNORMAL LOW (ref 0.3–1.2)
Total Protein: 6.9 g/dL (ref 6.0–8.3)

## 2013-01-24 LAB — CBC WITH DIFFERENTIAL/PLATELET
Basophils Absolute: 0 10*3/uL (ref 0.0–0.1)
Basophils Relative: 0 % (ref 0–1)
Eosinophils Absolute: 0.3 10*3/uL (ref 0.0–0.7)
HCT: 33.5 % — ABNORMAL LOW (ref 36.0–46.0)
MCH: 29.4 pg (ref 26.0–34.0)
MCHC: 33.7 g/dL (ref 30.0–36.0)
Monocytes Absolute: 1 10*3/uL (ref 0.1–1.0)
Neutro Abs: 6.5 10*3/uL (ref 1.7–7.7)
Neutrophils Relative %: 63 % (ref 43–77)
RDW: 12.4 % (ref 11.5–15.5)

## 2013-01-24 LAB — URINALYSIS, ROUTINE W REFLEX MICROSCOPIC
Protein, ur: NEGATIVE mg/dL
Urobilinogen, UA: 0.2 mg/dL (ref 0.0–1.0)

## 2013-01-24 LAB — URINE MICROSCOPIC-ADD ON

## 2013-01-24 LAB — LIPASE, BLOOD: Lipase: 41 U/L (ref 11–59)

## 2013-01-24 LAB — PREGNANCY, URINE: Preg Test, Ur: NEGATIVE

## 2013-01-24 MED ORDER — CEPHALEXIN 500 MG PO CAPS: 500.0000 mg | ORAL_CAPSULE | Freq: Four times a day (QID) | ORAL | Status: DC

## 2013-01-24 MED ORDER — IOHEXOL 300 MG/ML  SOLN
50.0000 mL | Freq: Once | INTRAMUSCULAR | Status: AC | PRN
  Administered 2013-01-24: 50 mL via ORAL

## 2013-01-24 MED ORDER — IOHEXOL 300 MG/ML  SOLN
100.0000 mL | Freq: Once | INTRAMUSCULAR | Status: AC | PRN
  Administered 2013-01-24: 100 mL via INTRAVENOUS

## 2013-01-24 NOTE — ED Provider Notes (Signed)
Medical screening examination/treatment/procedure(s) were performed by non-physician practitioner and as supervising physician I was immediately available for consultation/collaboration.   Gwyneth Sprout, MD 01/24/13 1930

## 2013-01-24 NOTE — ED Notes (Signed)
C/o pain to left abd and flank x 3 days with dysuria and freq-also c/o swelling to both feet x 3-4 days

## 2013-01-24 NOTE — ED Provider Notes (Signed)
History     CSN: [id number]  Arrival date & time 01/24/13  1557   First MD Initiated Contact with Patient 01/24/13 1607      Chief Complaint  Patient presents with  . Abdominal Pain    (Consider location/radiation/quality/duration/timing/severity/associated sxs/prior treatment) HPI Comments: Pt states that she is having luq pain and dysuria without discharge:pt state that she has a history of pancreatitis but this doesn't necessarily feel the same:pt states that she has also had swelling in feet bilaterally without cp or sob:pt states that didn't have her menstrual cycle last month  Patient is a 42 y.o. female presenting with abdominal pain. The history is provided by the patient. No language interpreter was used.  Abdominal Pain Pain location:  LUQ Pain quality: aching   Pain radiates to:  L flank Pain severity:  Moderate Onset quality:  Gradual Timing:  Constant Progression:  Unchanged Chronicity:  New Context: not alcohol use   Relieved by:  Nothing Worsened by:  Nothing tried Ineffective treatments:  None tried Associated symptoms: diarrhea and nausea   Associated symptoms: no fever and no vomiting     Past Medical History  Diagnosis Date  . Pancreatic cyst   . Pancreatitis   . Ovarian cyst     Past Surgical History  Procedure Laterality Date  . Cholecystectomy    . Tubal ligation      No family history on file.  History  Substance Use Topics  . Smoking status: Never Smoker   . Smokeless tobacco: Never Used  . Alcohol Use: No    OB History   Grav Para Term Preterm Abortions TAB SAB Ect Mult Living                  Review of Systems  Constitutional: Negative for fever.  Respiratory: Negative.   Cardiovascular: Negative.   Gastrointestinal: Positive for nausea, abdominal pain and diarrhea. Negative for vomiting.    Allergies  Zofran and Reglan  Home Medications   Current Outpatient Rx  Name  Route  Sig  Dispense  Refill  . Buprenorphine  HCl-Naloxone HCl (SUBOXONE SL)   Sublingual   Place under the tongue.         . promethazine (PHENERGAN) 25 MG tablet   Oral   Take 25 mg by mouth every 6 (six) hours as needed for nausea.         Marland Kitchen METHADONE HCL PO   Oral   Take 30 mg by mouth.         . promethazine (PHENERGAN) 25 MG suppository   Rectal   Place 1 suppository (25 mg total) rectally every 6 (six) hours as needed for nausea.   12 each   0     BP 157/89  Pulse 80  Temp(Src) 98.6 F (37 C) (Oral)  Resp 18  Ht 5\' 7"  (1.702 m)  Wt 240 lb (108.863 kg)  BMI 37.58 kg/m2  SpO2 98%  LMP 12/03/2012  Physical Exam  Nursing note and vitals reviewed. Constitutional: She is oriented to person, place, and time. She appears well-developed and well-nourished.  HENT:  Head: Normocephalic and atraumatic.  Cardiovascular: Normal rate, regular rhythm and normal heart sounds.   Pulmonary/Chest: Effort normal and breath sounds normal.  Abdominal: Soft. Bowel sounds are normal. There is tenderness in the left upper quadrant.  Musculoskeletal:  Edema noted to feet bilaterally  Neurological: She is alert and oriented to person, place, and time.  Skin: Skin is warm and dry.  Psychiatric: She has a normal mood and affect.    ED Course  Procedures (including critical care time)  Labs Reviewed  URINALYSIS, ROUTINE W REFLEX MICROSCOPIC - Abnormal; Notable for the following:    Leukocytes, UA TRACE (*)    All other components within normal limits  CBC WITH DIFFERENTIAL - Abnormal; Notable for the following:    RBC 3.84 (*)    Hemoglobin 11.3 (*)    HCT 33.5 (*)    All other components within normal limits  COMPREHENSIVE METABOLIC PANEL - Abnormal; Notable for the following:    Glucose, Bld 103 (*)    Albumin 3.3 (*)    Total Bilirubin 0.2 (*)    All other components within normal limits  URINE MICROSCOPIC-ADD ON - Abnormal; Notable for the following:    Squamous Epithelial / LPF FEW (*)    Bacteria, UA MANY (*)     All other components within normal limits  URINE CULTURE  PREGNANCY, URINE  LIPASE, BLOOD   Ct Abdomen Pelvis W Contrast  01/24/2013   *RADIOLOGY REPORT*  Clinical Data: Left abdominal left flank pain.  CT ABDOMEN AND PELVIS WITH CONTRAST  Technique:  Multidetector CT imaging of the abdomen and pelvis was performed following the standard protocol during bolus administration of intravenous contrast.  Contrast: 50mL OMNIPAQUE IOHEXOL 300 MG/ML  SOLN, OMNIPAQUE IOHEXOL 300 MG/ML  SOLN  Comparison: CT abdomen pelvis 09/08/2012.  Findings: The lung bases are clear.  No pleural or pericardial effusion.  The patient is status post cholecystectomy. The liver, adrenal glands and pancreas are unremarkable.  Calcifications in the spleen are consistent with old granulomatous disease.  The left kidney is normal in appearance.  The patient has a duplicated right renal collecting system.  Uterus, adnexa and urinary bladder are unremarkable.  The stomach, small and large bowel and appendix appear normal.  No lymphadenopathy or fluid is seen.  There is no focal bony abnormality with degenerative disc disease noted at L4-5 and L5-S1.  IMPRESSION:  1.  No acute finding. 2.  Duplicated right renal collecting system is incidentally noted.   Original Report Authenticated By: Holley Dexter, M.D.     1. UTI (lower urinary tract infection)       MDM  Will treat for a possible uti:pt is okay to follow up with pcp as needed       Teressa Lower, NP 01/24/13 1822

## 2013-01-25 LAB — URINE CULTURE

## 2013-02-15 ENCOUNTER — Emergency Department (HOSPITAL_BASED_OUTPATIENT_CLINIC_OR_DEPARTMENT_OTHER)
Admission: EM | Admit: 2013-02-15 | Discharge: 2013-02-15 | Disposition: A | Discharge status: Home / Self Care | Payer: Self-pay | Attending: Emergency Medicine | Admitting: Emergency Medicine

## 2013-02-15 ENCOUNTER — Encounter (HOSPITAL_BASED_OUTPATIENT_CLINIC_OR_DEPARTMENT_OTHER): Payer: Self-pay | Admitting: Family Medicine

## 2013-02-15 DIAGNOSIS — R112 Nausea with vomiting, unspecified: Secondary | ICD-10-CM | POA: Insufficient documentation

## 2013-02-15 DIAGNOSIS — Z8719 Personal history of other diseases of the digestive system: Secondary | ICD-10-CM | POA: Insufficient documentation

## 2013-02-15 DIAGNOSIS — R42 Dizziness and giddiness: Secondary | ICD-10-CM | POA: Insufficient documentation

## 2013-02-15 DIAGNOSIS — Z3202 Encounter for pregnancy test, result negative: Secondary | ICD-10-CM | POA: Insufficient documentation

## 2013-02-15 DIAGNOSIS — Z8742 Personal history of other diseases of the female genital tract: Secondary | ICD-10-CM | POA: Insufficient documentation

## 2013-02-15 DIAGNOSIS — R51 Headache: Secondary | ICD-10-CM | POA: Insufficient documentation

## 2013-02-15 LAB — CBC WITH DIFFERENTIAL/PLATELET
Basophils Absolute: 0 10*3/uL (ref 0.0–0.1)
Eosinophils Relative: 2 % (ref 0–5)
HCT: 37 % (ref 36.0–46.0)
Lymphocytes Relative: 23 % (ref 12–46)
Lymphs Abs: 1.7 10*3/uL (ref 0.7–4.0)
MCV: 86.9 fL (ref 78.0–100.0)
Monocytes Absolute: 0.7 10*3/uL (ref 0.1–1.0)
Neutro Abs: 5.1 10*3/uL (ref 1.7–7.7)
RBC: 4.26 MIL/uL (ref 3.87–5.11)
RDW: 12.7 % (ref 11.5–15.5)
WBC: 7.7 10*3/uL (ref 4.0–10.5)

## 2013-02-15 LAB — URINALYSIS, ROUTINE W REFLEX MICROSCOPIC
Bilirubin Urine: NEGATIVE
Glucose, UA: NEGATIVE mg/dL
Specific Gravity, Urine: 1.013 (ref 1.005–1.030)
pH: 7 (ref 5.0–8.0)

## 2013-02-15 LAB — COMPREHENSIVE METABOLIC PANEL
ALT: 26 U/L (ref 0–35)
AST: 31 U/L (ref 0–37)
CO2: 26 mEq/L (ref 19–32)
Calcium: 9.1 mg/dL (ref 8.4–10.5)
Chloride: 102 mEq/L (ref 96–112)
Creatinine, Ser: 0.7 mg/dL (ref 0.50–1.10)
GFR calc Af Amer: >90 mL/min (ref >90)
GFR calc non Af Amer: >90 mL/min (ref >90)
Glucose, Bld: 109 mg/dL — ABNORMAL HIGH (ref 70–99)
Sodium: 137 mEq/L (ref 135–145)
Total Bilirubin: 0.5 mg/dL (ref 0.3–1.2)

## 2013-02-15 LAB — PREGNANCY, URINE: Preg Test, Ur: NEGATIVE

## 2013-02-15 MED ORDER — MECLIZINE HCL 25 MG PO TABS: 25.0000 mg | ORAL_TABLET | Freq: Three times a day (TID) | ORAL | Status: DC | PRN

## 2013-02-15 MED ORDER — SODIUM CHLORIDE 0.9 % IV BOLUS (SEPSIS)
1000.0000 mL | Freq: Once | INTRAVENOUS | Status: AC
  Administered 2013-02-15: 1000 mL via INTRAVENOUS

## 2013-02-15 MED ORDER — PROMETHAZINE HCL 25 MG/ML IJ SOLN
12.5000 mg | Freq: Once | INTRAMUSCULAR | Status: AC
  Administered 2013-02-15: 12.5 mg via INTRAVENOUS
  Filled 2013-02-15: qty 1

## 2013-02-15 MED ORDER — MECLIZINE HCL 25 MG PO TABS
25.0000 mg | ORAL_TABLET | Freq: Once | ORAL | Status: AC
  Administered 2013-02-15: 25 mg via ORAL
  Filled 2013-02-15: qty 1

## 2013-02-15 NOTE — ED Notes (Signed)
MD at bedside. 

## 2013-02-15 NOTE — ED Provider Notes (Signed)
History     CSN: [id number]  Arrival date & time 02/15/13  1005   First MD Initiated Contact with Patient 02/15/13 1054      Chief Complaint  Patient presents with  . Dizziness    (Consider location/radiation/quality/duration/timing/severity/associated sxs/prior treatment) HPI Comments: Patient presents with dizziness. She states that she had some mild dizziness yesterday but it got markedly worse today. She has a sensation that the room spinning. She says is associated with nausea and vomiting. It's worse when she turns her head from side to side and also when she stands up. She has a mild frontal headache. She denies any abdominal pain or fevers. She denies E. numbness or weakness in her extremities. She denies any slurred speech or difficulty getting her words out. She denies any vision changes. She denies any history of vertigo in the past. She denies any recent head injuries.   Past Medical History  Diagnosis Date  . Pancreatic cyst   . Pancreatitis   . Ovarian cyst     Past Surgical History  Procedure Laterality Date  . Cholecystectomy    . Tubal ligation      No family history on file.  History  Substance Use Topics  . Smoking status: Never Smoker   . Smokeless tobacco: Never Used  . Alcohol Use: No    OB History   Grav Para Term Preterm Abortions TAB SAB Ect Mult Living                  Review of Systems  Constitutional: Negative for fever, chills, diaphoresis and fatigue.  HENT: Negative for congestion, rhinorrhea and sneezing.   Eyes: Negative.   Respiratory: Negative for cough, chest tightness and shortness of breath.   Cardiovascular: Negative for chest pain and leg swelling.  Gastrointestinal: Positive for nausea and vomiting. Negative for abdominal pain, diarrhea and blood in stool.  Genitourinary: Negative for frequency, hematuria, flank pain and difficulty urinating.  Musculoskeletal: Negative for back pain and arthralgias.  Skin: Negative for  rash.  Neurological: Positive for dizziness and headaches. Negative for speech difficulty, weakness and numbness.    Allergies  Zofran and Reglan  Home Medications   Current Outpatient Rx  Name  Route  Sig  Dispense  Refill  . Amoxicillin-Pot Clavulanate (AUGMENTIN PO)   Oral   Take by mouth.         . Buprenorphine HCl-Naloxone HCl (SUBOXONE SL)   Sublingual   Place under the tongue.         . cephALEXin (KEFLEX) 500 MG capsule   Oral   Take 1 capsule (500 mg total) by mouth 4 (four) times daily.   28 capsule   0   . meclizine (ANTIVERT) 25 MG tablet   Oral   Take 1 tablet (25 mg total) by mouth 3 (three) times daily as needed.   30 tablet   0   . METHADONE HCL PO   Oral   Take 30 mg by mouth.         . promethazine (PHENERGAN) 25 MG suppository   Rectal   Place 1 suppository (25 mg total) rectally every 6 (six) hours as needed for nausea.   12 each   0   . promethazine (PHENERGAN) 25 MG tablet   Oral   Take 25 mg by mouth every 6 (six) hours as needed for nausea.           BP 155/102  Pulse 88  Temp(Src) 97.6 F (  36.4 C) (Oral)  Resp 20  Ht 5\' 7"  (1.702 m)  Wt 250 lb (113.399 kg)  BMI 39.15 kg/m2  SpO2 97%  LMP 12/03/2012  Physical Exam  Constitutional: She is oriented to person, place, and time. She appears well-developed and well-nourished.  HENT:  Head: Normocephalic and atraumatic.  Eyes: Pupils are equal, round, and reactive to light.  No photophobia  Neck: Normal range of motion. Neck supple.  No nystagmus present  Cardiovascular: Normal rate, regular rhythm and normal heart sounds.   Pulmonary/Chest: Effort normal and breath sounds normal. No respiratory distress. She has no wheezes. She has no rales. She exhibits no tenderness.  Abdominal: Soft. Bowel sounds are normal. There is no tenderness. There is no rebound and no guarding.  Musculoskeletal: Normal range of motion. She exhibits no edema.  Lymphadenopathy:    She has no  cervical adenopathy.  Neurological: She is alert and oriented to person, place, and time. She has normal strength. No cranial nerve deficit or sensory deficit. GCS eye subscore is 4. GCS verbal subscore is 5. GCS motor subscore is 6.  Finger to nose intact, no pronator drift  Skin: Skin is warm and dry. No rash noted.  Psychiatric: She has a normal mood and affect.    ED Course  Procedures (including critical care time)  Results for orders placed during the hospital encounter of 02/15/13  URINALYSIS, ROUTINE W REFLEX MICROSCOPIC      Result Value Range   Color, Urine YELLOW  YELLOW   APPearance CLEAR  CLEAR   Specific Gravity, Urine 1.013  1.005 - 1.030   pH 7.0  5.0 - 8.0   Glucose, UA NEGATIVE  NEGATIVE mg/dL   Hgb urine dipstick MODERATE (*) NEGATIVE   Bilirubin Urine NEGATIVE  NEGATIVE   Ketones, ur NEGATIVE  NEGATIVE mg/dL   Protein, ur NEGATIVE  NEGATIVE mg/dL   Urobilinogen, UA 0.2  0.0 - 1.0 mg/dL   Nitrite NEGATIVE  NEGATIVE   Leukocytes, UA TRACE (*) NEGATIVE  PREGNANCY, URINE      Result Value Range   Preg Test, Ur NEGATIVE  NEGATIVE  CBC WITH DIFFERENTIAL      Result Value Range   WBC 7.7  4.0 - 10.5 K/uL   RBC 4.26  3.87 - 5.11 MIL/uL   Hemoglobin 12.6  12.0 - 15.0 g/dL   HCT 16.1  09.6 - 04.5 %   MCV 86.9  78.0 - 100.0 fL   MCH 29.6  26.0 - 34.0 pg   MCHC 34.1  30.0 - 36.0 g/dL   RDW 40.9  81.1 - 91.4 %   Platelets 248  150 - 400 K/uL   Neutrophils Relative % 66  43 - 77 %   Neutro Abs 5.1  1.7 - 7.7 K/uL   Lymphocytes Relative 23  12 - 46 %   Lymphs Abs 1.7  0.7 - 4.0 K/uL   Monocytes Relative 9  3 - 12 %   Monocytes Absolute 0.7  0.1 - 1.0 K/uL   Eosinophils Relative 2  0 - 5 %   Eosinophils Absolute 0.2  0.0 - 0.7 K/uL   Basophils Relative 1  0 - 1 %   Basophils Absolute 0.0  0.0 - 0.1 K/uL  COMPREHENSIVE METABOLIC PANEL      Result Value Range   Sodium 137  135 - 145 mEq/L   Potassium 3.7  3.5 - 5.1 mEq/L   Chloride 102  96 - 112 mEq/L   CO2  26   19 - 32 mEq/L   Glucose, Bld 109 (*) 70 - 99 mg/dL   BUN 10  6 - 23 mg/dL   Creatinine, Ser 0.98  0.50 - 1.10 mg/dL   Calcium 9.1  8.4 - 11.9 mg/dL   Total Protein 7.6  6.0 - 8.3 g/dL   Albumin 3.7  3.5 - 5.2 g/dL   AST 31  0 - 37 U/L   ALT 26  0 - 35 U/L   Alkaline Phosphatase 74  39 - 117 U/L   Total Bilirubin 0.5  0.3 - 1.2 mg/dL   GFR calc non Af Amer >90  >90 mL/min   GFR calc Af Amer >90  >90 mL/min  URINE MICROSCOPIC-ADD ON      Result Value Range   Squamous Epithelial / LPF RARE  RARE   WBC, UA 0-2  <3 WBC/hpf   RBC / HPF 7-10  <3 RBC/hpf   Bacteria, UA FEW (*) RARE   Urine-Other MUCOUS PRESENT     Ct Abdomen Pelvis W Contrast  01/24/2013   *RADIOLOGY REPORT*  Clinical Data: Left abdominal left flank pain.  CT ABDOMEN AND PELVIS WITH CONTRAST  Technique:  Multidetector CT imaging of the abdomen and pelvis was performed following the standard protocol during bolus administration of intravenous contrast.  Contrast: 50mL OMNIPAQUE IOHEXOL 300 MG/ML  SOLN, OMNIPAQUE IOHEXOL 300 MG/ML  SOLN  Comparison: CT abdomen pelvis 09/08/2012.  Findings: The lung bases are clear.  No pleural or pericardial effusion.  The patient is status post cholecystectomy. The liver, adrenal glands and pancreas are unremarkable.  Calcifications in the spleen are consistent with old granulomatous disease.  The left kidney is normal in appearance.  The patient has a duplicated right renal collecting system.  Uterus, adnexa and urinary bladder are unremarkable.  The stomach, small and large bowel and appendix appear normal.  No lymphadenopathy or fluid is seen.  There is no focal bony abnormality with degenerative disc disease noted at L4-5 and L5-S1.  IMPRESSION:  1.  No acute finding. 2.  Duplicated right renal collecting system is incidentally noted.   Original Report Authenticated By: Holley Dexter, M.D.      1. Vertigo       MDM  Patient is given IV fluids, Phenergan and meclizine. She was  feeling much better after that she says her dizziness and headache to resolve. She was able to ambulate without problem. She has no other neurologic deficits or suggestions of a central etiology for her vertigo symptoms. She was discharged in good condition with a prescription for Antivert. She was advised to followup with her primary care physician for recheck within the next week or return here as needed for any worsening symptoms. She did have some hematuria noted but she states that she's having some vaginal spotting which would explain this.        Rolan Bucco, MD 02/15/13 [phone]

## 2013-02-15 NOTE — ED Notes (Signed)
Pt OOB to restroom with assist x 1. Urine sample collected

## 2013-02-15 NOTE — ED Notes (Addendum)
Pt c/o dizziness and nausea with a "mild" headache since last night. Pt reports 1 episode of vomiting today. Pt sts she is taking Augmentin for dental abscess.

## 2013-03-04 ENCOUNTER — Emergency Department (HOSPITAL_BASED_OUTPATIENT_CLINIC_OR_DEPARTMENT_OTHER): Payer: Self-pay

## 2013-03-04 ENCOUNTER — Emergency Department (HOSPITAL_BASED_OUTPATIENT_CLINIC_OR_DEPARTMENT_OTHER)
Admission: EM | Admit: 2013-03-04 | Discharge: 2013-03-04 | Disposition: A | Discharge status: Home / Self Care | Payer: Self-pay | Attending: Emergency Medicine | Admitting: Emergency Medicine

## 2013-03-04 DIAGNOSIS — G8929 Other chronic pain: Secondary | ICD-10-CM | POA: Insufficient documentation

## 2013-03-04 DIAGNOSIS — R300 Dysuria: Secondary | ICD-10-CM | POA: Insufficient documentation

## 2013-03-04 DIAGNOSIS — Z8719 Personal history of other diseases of the digestive system: Secondary | ICD-10-CM | POA: Insufficient documentation

## 2013-03-04 DIAGNOSIS — Z79899 Other long term (current) drug therapy: Secondary | ICD-10-CM | POA: Insufficient documentation

## 2013-03-04 DIAGNOSIS — M545 Low back pain, unspecified: Secondary | ICD-10-CM | POA: Insufficient documentation

## 2013-03-04 DIAGNOSIS — R350 Frequency of micturition: Secondary | ICD-10-CM | POA: Insufficient documentation

## 2013-03-04 DIAGNOSIS — Z9851 Tubal ligation status: Secondary | ICD-10-CM | POA: Insufficient documentation

## 2013-03-04 DIAGNOSIS — Z3202 Encounter for pregnancy test, result negative: Secondary | ICD-10-CM | POA: Insufficient documentation

## 2013-03-04 DIAGNOSIS — R1012 Left upper quadrant pain: Secondary | ICD-10-CM | POA: Insufficient documentation

## 2013-03-04 DIAGNOSIS — Z8742 Personal history of other diseases of the female genital tract: Secondary | ICD-10-CM | POA: Insufficient documentation

## 2013-03-04 LAB — COMPREHENSIVE METABOLIC PANEL
ALT: 15 U/L (ref 0–35)
CO2: 26 mEq/L (ref 19–32)
Calcium: 8.7 mg/dL (ref 8.4–10.5)
Chloride: 102 mEq/L (ref 96–112)
Creatinine, Ser: 0.6 mg/dL (ref 0.50–1.10)
GFR calc Af Amer: >90 mL/min (ref >90)
GFR calc non Af Amer: >90 mL/min (ref >90)
Glucose, Bld: 130 mg/dL — ABNORMAL HIGH (ref 70–99)
Sodium: 136 mEq/L (ref 135–145)
Total Bilirubin: 0.3 mg/dL (ref 0.3–1.2)

## 2013-03-04 LAB — CBC WITH DIFFERENTIAL/PLATELET
Eosinophils Relative: 2 % (ref 0–5)
HCT: 33.5 % — ABNORMAL LOW (ref 36.0–46.0)
Hemoglobin: 11.2 g/dL — ABNORMAL LOW (ref 12.0–15.0)
Lymphocytes Relative: 18 % (ref 12–46)
Lymphs Abs: 1.7 10*3/uL (ref 0.7–4.0)
MCV: 87.5 fL (ref 78.0–100.0)
Monocytes Absolute: 0.7 10*3/uL (ref 0.1–1.0)
Neutro Abs: 6.9 10*3/uL (ref 1.7–7.7)
RBC: 3.83 MIL/uL — ABNORMAL LOW (ref 3.87–5.11)
WBC: 9.5 10*3/uL (ref 4.0–10.5)

## 2013-03-04 LAB — URINALYSIS, ROUTINE W REFLEX MICROSCOPIC
Bilirubin Urine: NEGATIVE
Ketones, ur: NEGATIVE mg/dL
Nitrite: NEGATIVE
Protein, ur: NEGATIVE mg/dL
Specific Gravity, Urine: 1.025 (ref 1.005–1.030)
Urobilinogen, UA: 0.2 mg/dL (ref 0.0–1.0)

## 2013-03-04 LAB — PREGNANCY, URINE: Preg Test, Ur: NEGATIVE

## 2013-03-04 MED ORDER — SODIUM CHLORIDE 0.9 % IV BOLUS (SEPSIS)
1000.0000 mL | Freq: Once | INTRAVENOUS | Status: AC
  Administered 2013-03-04: 1000 mL via INTRAVENOUS

## 2013-03-04 MED ORDER — PROMETHAZINE HCL 25 MG PO TABS: 25.0000 mg | ORAL_TABLET | Freq: Four times a day (QID) | ORAL | Status: DC | PRN

## 2013-03-04 MED ORDER — KETOROLAC TROMETHAMINE 30 MG/ML IJ SOLN
30.0000 mg | Freq: Once | INTRAMUSCULAR | Status: AC
  Administered 2013-03-04: 30 mg via INTRAVENOUS
  Filled 2013-03-04: qty 1

## 2013-03-04 MED ORDER — PROMETHAZINE HCL 25 MG/ML IJ SOLN
12.5000 mg | Freq: Once | INTRAMUSCULAR | Status: AC
  Administered 2013-03-04: 12.5 mg via INTRAVENOUS
  Filled 2013-03-04: qty 1

## 2013-03-04 NOTE — ED Provider Notes (Signed)
History     CSN: [id number]  Arrival date & time 03/04/13  1157   First MD Initiated Contact with Patient 03/04/13 1207      Chief Complaint  Patient presents with  . Abdominal Pain    (Consider location/radiation/quality/duration/timing/severity/associated sxs/prior treatment) HPI Patient with low back pain for two weeks with some frequency of urination and dysuria.  Now with left upper quadrant pain for a day constant and sharp in nature.  Patient with similar symptoms previously and states she has had pancreatitis in the past of unknown etiology.  She has not had nausea or vomiting, fever, chills, diarrhea, or hematuria.  She was recently seen here with similar symptoms on 5/18,08/13/12, 09/08/12, and 1/2/14and had a ct done at that time without acute abnormality.   Past Medical History  Diagnosis Date  . Pancreatic cyst   . Pancreatitis   . Ovarian cyst     Past Surgical History  Procedure Laterality Date  . Cholecystectomy    . Tubal ligation      No family history on file.  History  Substance Use Topics  . Smoking status: Never Smoker   . Smokeless tobacco: Never Used  . Alcohol Use: No    OB History   Grav Para Term Preterm Abortions TAB SAB Ect Mult Living                  Review of Systems  All other systems reviewed and are negative.    Allergies  Zofran and Reglan  Home Medications   Current Outpatient Rx  Name  Route  Sig  Dispense  Refill  . Amoxicillin-Pot Clavulanate (AUGMENTIN PO)   Oral   Take by mouth.         . Buprenorphine HCl-Naloxone HCl (SUBOXONE SL)   Sublingual   Place under the tongue.         . cephALEXin (KEFLEX) 500 MG capsule   Oral   Take 1 capsule (500 mg total) by mouth 4 (four) times daily.   28 capsule   0   . meclizine (ANTIVERT) 25 MG tablet   Oral   Take 1 tablet (25 mg total) by mouth 3 (three) times daily as needed.   30 tablet   0   . METHADONE HCL PO   Oral   Take 30 mg by mouth.         .  promethazine (PHENERGAN) 25 MG suppository   Rectal   Place 1 suppository (25 mg total) rectally every 6 (six) hours as needed for nausea.   12 each   0   . promethazine (PHENERGAN) 25 MG tablet   Oral   Take 25 mg by mouth every 6 (six) hours as needed for nausea.           BP 145/84  Pulse 92  Temp(Src) 98.2 F (36.8 C) (Oral)  Resp 20  Ht 5\' 8"  (1.727 m)  Wt 250 lb (113.399 kg)  BMI 38.02 kg/m2  SpO2 99%  LMP 01/02/2013  Physical Exam  Nursing note and vitals reviewed. Constitutional: She is oriented to person, place, and time. She appears well-developed and well-nourished.  obese  HENT:  Head: Normocephalic and atraumatic.  Eyes: Conjunctivae and EOM are normal. Pupils are equal, round, and reactive to light.  Neck: Normal range of motion. Neck supple.  Cardiovascular: Normal rate, regular rhythm, normal heart sounds and intact distal pulses.   Pulmonary/Chest: Effort normal and breath sounds normal.  Abdominal: Soft. Bowel  sounds are normal. She exhibits no distension and no mass. There is no tenderness. There is no rebound and no guarding.  Musculoskeletal: Normal range of motion.  Neurological: She is alert and oriented to person, place, and time. She has normal reflexes.  Skin: Skin is warm and dry.  Psychiatric: She has a normal mood and affect. Her behavior is normal. Judgment and thought content normal.    ED Course  Procedures (including critical care time)  Labs Reviewed  URINALYSIS, ROUTINE W REFLEX MICROSCOPIC - Abnormal; Notable for the following:    Leukocytes, UA SMALL (*)    All other components within normal limits  URINE MICROSCOPIC-ADD ON - Abnormal; Notable for the following:    Squamous Epithelial / LPF FEW (*)    All other components within normal limits  CBC WITH DIFFERENTIAL - Abnormal; Notable for the following:    RBC 3.83 (*)    Hemoglobin 11.2 (*)    HCT 33.5 (*)    All other components within normal limits  COMPREHENSIVE  METABOLIC PANEL - Abnormal; Notable for the following:    Potassium 3.3 (*)    Glucose, Bld 130 (*)    Albumin 3.4 (*)    All other components within normal limits  PREGNANCY, URINE  LIPASE, BLOOD   Dg Abd 1 View  03/04/2013   *RADIOLOGY REPORT*  Clinical Data: Left lower abdominal pain.  ABDOMEN - 1 VIEW  Comparison: 01/24/2013 CT scan  Findings: Scattered punctate densities in the abdomen likely represent inspissated barium or pill fragments.  These have a different pattern compared to prior conventional radiographic exams.  Some of the left upper quadrant calcifications are due to remote granulomatous disease involving the spleen.  Lumbar spondylosis noted at L4-5.  Gas and stool present in the colon.  No dilated bowel identified.  IMPRESSION: 1.  Scattered punctate calcific densities in the abdomen probably represent pill fragments, or less likely inspissated barium. 2.  Lower lumbar spondylosis.   Original Report Authenticated By: Gaylyn Rong, M.D.     No diagnosis found.    MDM  Patient with chronic left flank pain without evidence of acute abnormality on suboxone for history of narcotics use. She is stable here and advised to follow up with her pmd at [facility] as needed.        Hilario Quarry, MD 03/04/13 [phone]

## 2013-03-04 NOTE — ED Notes (Signed)
Patient states that she has abd pain, lower abd pressure and back pain since yesterday

## 2013-08-18 ENCOUNTER — Emergency Department (HOSPITAL_BASED_OUTPATIENT_CLINIC_OR_DEPARTMENT_OTHER): Payer: Self-pay

## 2013-08-18 ENCOUNTER — Emergency Department (HOSPITAL_BASED_OUTPATIENT_CLINIC_OR_DEPARTMENT_OTHER)
Admission: EM | Admit: 2013-08-18 | Discharge: 2013-08-18 | Disposition: A | Discharge status: Home / Self Care | Payer: Self-pay | Attending: Emergency Medicine | Admitting: Emergency Medicine

## 2013-08-18 ENCOUNTER — Encounter (HOSPITAL_BASED_OUTPATIENT_CLINIC_OR_DEPARTMENT_OTHER): Payer: Self-pay | Admitting: Emergency Medicine

## 2013-08-18 DIAGNOSIS — R109 Unspecified abdominal pain: Secondary | ICD-10-CM

## 2013-08-18 DIAGNOSIS — B820 Intestinal helminthiasis, unspecified: Secondary | ICD-10-CM | POA: Insufficient documentation

## 2013-08-18 DIAGNOSIS — R1012 Left upper quadrant pain: Secondary | ICD-10-CM | POA: Insufficient documentation

## 2013-08-18 DIAGNOSIS — Z3202 Encounter for pregnancy test, result negative: Secondary | ICD-10-CM | POA: Insufficient documentation

## 2013-08-18 DIAGNOSIS — Z8742 Personal history of other diseases of the female genital tract: Secondary | ICD-10-CM | POA: Insufficient documentation

## 2013-08-18 LAB — CBC WITH DIFFERENTIAL/PLATELET
Basophils Absolute: 0 10*3/uL (ref 0.0–0.1)
Basophils Relative: 0 % (ref 0–1)
Eosinophils Absolute: 0.2 10*3/uL (ref 0.0–0.7)
Eosinophils Relative: 2 % (ref 0–5)
HCT: 33.7 % — ABNORMAL LOW (ref 36.0–46.0)
MCH: 29.1 pg (ref 26.0–34.0)
MCHC: 32.9 g/dL (ref 30.0–36.0)
MCV: 88.5 fL (ref 78.0–100.0)
Monocytes Absolute: 1 10*3/uL (ref 0.1–1.0)
Platelets: 229 10*3/uL (ref 150–400)
RDW: 13.3 % (ref 11.5–15.5)
WBC: 11.4 10*3/uL — ABNORMAL HIGH (ref 4.0–10.5)

## 2013-08-18 LAB — COMPREHENSIVE METABOLIC PANEL
ALT: 13 U/L (ref 0–35)
AST: 16 U/L (ref 0–37)
CO2: 27 mEq/L (ref 19–32)
Calcium: 8.8 mg/dL (ref 8.4–10.5)
Creatinine, Ser: 0.6 mg/dL (ref 0.50–1.10)
GFR calc Af Amer: >90 mL/min (ref >90)
GFR calc non Af Amer: >90 mL/min (ref >90)
Sodium: 139 mEq/L (ref 135–145)
Total Protein: 6.8 g/dL (ref 6.0–8.3)

## 2013-08-18 LAB — URINALYSIS, ROUTINE W REFLEX MICROSCOPIC
Bilirubin Urine: NEGATIVE
Glucose, UA: NEGATIVE mg/dL
Ketones, ur: NEGATIVE mg/dL
Protein, ur: NEGATIVE mg/dL

## 2013-08-18 LAB — URINE MICROSCOPIC-ADD ON

## 2013-08-18 MED ORDER — PROMETHAZINE HCL 25 MG/ML IJ SOLN
12.5000 mg | Freq: Once | INTRAMUSCULAR | Status: AC
  Administered 2013-08-18: 12.5 mg via INTRAVENOUS
  Filled 2013-08-18: qty 1

## 2013-08-18 MED ORDER — HYDROCODONE-ACETAMINOPHEN 5-325 MG PO TABS
2.0000 | ORAL_TABLET | ORAL | Status: DC | PRN
Start: 2013-08-18 — End: 2013-10-09

## 2013-08-18 MED ORDER — IOHEXOL 300 MG/ML  SOLN
100.0000 mL | Freq: Once | INTRAMUSCULAR | Status: AC | PRN
  Administered 2013-08-18: 100 mL via INTRAVENOUS

## 2013-08-18 MED ORDER — SODIUM CHLORIDE 0.9 % IV BOLUS (SEPSIS)
1000.0000 mL | Freq: Once | INTRAVENOUS | Status: AC
  Administered 2013-08-18: 1000 mL via INTRAVENOUS

## 2013-08-18 MED ORDER — MORPHINE SULFATE 4 MG/ML IJ SOLN
4.0000 mg | Freq: Once | INTRAMUSCULAR | Status: AC
  Administered 2013-08-18: 4 mg via INTRAVENOUS
  Filled 2013-08-18: qty 1

## 2013-08-18 MED ORDER — ONDANSETRON HCL 4 MG/2ML IJ SOLN
INTRAMUSCULAR | Status: AC
  Filled 2013-08-18: qty 2

## 2013-08-18 NOTE — ED Provider Notes (Signed)
CSN: [id number]     Arrival date & time 08/18/13  1120 History   First MD Initiated Contact with Patient 08/18/13 1233     Chief Complaint  Patient presents with  . Abdominal Pain   (Consider location/radiation/quality/duration/timing/severity/associated sxs/prior Treatment) HPI Comments: Patient is a 42 year old female with history of chronic abdominal pain related to a pancreatic pseudocyst that she developed several years ago. This was treated at an outside facility. She has been here several times with worsening of this pain. She states that she is having pain in her left upper quadrant which radiates to the left flank. She also reports generalized cramping all throughout her abdomen.  Patient is a 42 y.o. female presenting with abdominal pain. The history is provided by the patient.  Abdominal Pain Pain location:  LUQ Pain quality: cramping   Pain radiates to:  L flank Pain severity:  Severe Onset quality:  Gradual Duration:  1 week Timing:  Constant Progression:  Worsening Chronicity:  Recurrent Relieved by:  Nothing Worsened by:  Nothing tried   Past Medical History  Diagnosis Date  . Pancreatic cyst   . Pancreatitis   . Ovarian cyst    Past Surgical History  Procedure Laterality Date  . Cholecystectomy    . Tubal ligation     No family history on file. History  Substance Use Topics  . Smoking status: Never Smoker   . Smokeless tobacco: Never Used  . Alcohol Use: No   OB History   Grav Para Term Preterm Abortions TAB SAB Ect Mult Living                 Review of Systems  Gastrointestinal: Positive for abdominal pain.  All other systems reviewed and are negative.    Allergies  Zofran and Reglan  Home Medications   Current Outpatient Rx  Name  Route  Sig  Dispense  Refill  . Buprenorphine HCl-Naloxone HCl (SUBOXONE SL)   Sublingual   Place under the tongue.         . promethazine (PHENERGAN) 25 MG tablet   Oral   Take 25 mg by mouth every 6  (six) hours as needed for nausea.          BP 157/92  Pulse 82  Temp(Src) 99.2 F (37.3 C) (Oral)  Resp 16  Ht 5\' 7"  (1.702 m)  Wt 250 lb (113.399 kg)  BMI 39.15 kg/m2  SpO2 97% Physical Exam  Nursing note and vitals reviewed. Constitutional: She is oriented to person, place, and time. She appears well-developed and well-nourished. No distress.  HENT:  Head: Normocephalic and atraumatic.  Neck: Normal range of motion. Neck supple.  Cardiovascular: Normal rate and regular rhythm.  Exam reveals no gallop and no friction rub.   No murmur heard. Pulmonary/Chest: Effort normal and breath sounds normal. No respiratory distress. She has no wheezes.  Abdominal: Soft. Bowel sounds are normal. She exhibits no distension and no mass. There is tenderness. There is no rebound and no guarding.  The abdomen is obese. There is tenderness to palpation in all 4 quadrants, especially the left upper quadrant and left flank.  Musculoskeletal: Normal range of motion.  Neurological: She is alert and oriented to person, place, and time.  Skin: Skin is warm and dry. She is not diaphoretic.    ED Course  Procedures (including critical care time) Labs Review Labs Reviewed  URINALYSIS, ROUTINE W REFLEX MICROSCOPIC - Abnormal; Notable for the following:    APPearance CLOUDY (*)  Leukocytes, UA SMALL (*)    All other components within normal limits  URINE MICROSCOPIC-ADD ON - Abnormal; Notable for the following:    Squamous Epithelial / LPF MANY (*)    Bacteria, UA MANY (*)    All other components within normal limits  URINE CULTURE  PREGNANCY, URINE  CBC WITH DIFFERENTIAL  COMPREHENSIVE METABOLIC PANEL  LIPASE, BLOOD   Imaging Review No results found.  EKG Interpretation    Date/Time:  Sunday August 18 2013 11:31:27 EST Ventricular Rate:  89 PR Interval:  140 QRS Duration: 100 QT Interval:  382 QTC Calculation: 464 R Axis:   73 Text Interpretation:  Normal sinus rhythm Cannot rule  out Anterior infarct , age undetermined Abnormal ECG Confirmed by DELOS  MD, Curby Carswell ([id number]) on 08/18/2013 12:34:12 PM            MDM  No diagnosis found. Patient is a 42 year old female with history of pancreatic pseudocyst. She presents with complaints of left-sided abdominal pain. Workup reveals a white count of 11 however the remainder of laboratory studies urinalysis and CT scan are all unremarkable. She is now had several CT scans at this institution for similar complaints and have shown no Intra-abdominal pathology. I recommend that she obtain a gastroenterologist or someone to help explain the discomfort she is experiencing. There is some mention of stool on the CT scan and I will recommend she take magnesium citrate for this.   Geoffery Lyons, MD 08/18/13 [phone]

## 2013-08-18 NOTE — ED Notes (Signed)
Patient transported to CT 

## 2013-08-18 NOTE — ED Notes (Signed)
Patient here with left sided abdominal pain with radiation to lower abdomen and back, reports that the pain has been intensifying the past week. Nausea with same. Hx of pancreatitis.

## 2013-08-20 LAB — URINE CULTURE
Colony Count: NO GROWTH
Culture: NO GROWTH

## 2013-08-24 ENCOUNTER — Ambulatory Visit: Payer: Medicaid Other

## 2013-09-01 ENCOUNTER — Emergency Department (HOSPITAL_BASED_OUTPATIENT_CLINIC_OR_DEPARTMENT_OTHER)
Admission: EM | Admit: 2013-09-01 | Discharge: 2013-09-02 | Disposition: A | Discharge status: Home / Self Care | Payer: Medicaid Other | Attending: Emergency Medicine | Admitting: Emergency Medicine

## 2013-09-01 ENCOUNTER — Encounter (HOSPITAL_BASED_OUTPATIENT_CLINIC_OR_DEPARTMENT_OTHER): Payer: Self-pay | Admitting: Emergency Medicine

## 2013-09-01 DIAGNOSIS — R112 Nausea with vomiting, unspecified: Secondary | ICD-10-CM | POA: Insufficient documentation

## 2013-09-01 DIAGNOSIS — G8929 Other chronic pain: Secondary | ICD-10-CM | POA: Insufficient documentation

## 2013-09-01 DIAGNOSIS — K861 Other chronic pancreatitis: Secondary | ICD-10-CM | POA: Insufficient documentation

## 2013-09-01 DIAGNOSIS — R519 Headache, unspecified: Secondary | ICD-10-CM

## 2013-09-01 DIAGNOSIS — Z8719 Personal history of other diseases of the digestive system: Secondary | ICD-10-CM | POA: Insufficient documentation

## 2013-09-01 DIAGNOSIS — H53149 Visual discomfort, unspecified: Secondary | ICD-10-CM | POA: Insufficient documentation

## 2013-09-01 DIAGNOSIS — Z8742 Personal history of other diseases of the female genital tract: Secondary | ICD-10-CM | POA: Insufficient documentation

## 2013-09-01 DIAGNOSIS — R51 Headache: Secondary | ICD-10-CM | POA: Insufficient documentation

## 2013-09-01 DIAGNOSIS — R1013 Epigastric pain: Secondary | ICD-10-CM | POA: Insufficient documentation

## 2013-09-01 NOTE — ED Notes (Signed)
Headache with nausea since 2 pm, took phenergan at 7pm. Pt alert and oriented x 4, no neuro deficits noted.

## 2013-09-02 ENCOUNTER — Emergency Department (HOSPITAL_BASED_OUTPATIENT_CLINIC_OR_DEPARTMENT_OTHER): Payer: Medicaid Other

## 2013-09-02 MED ORDER — PROMETHAZINE HCL 25 MG/ML IJ SOLN
25.0000 mg | Freq: Once | INTRAMUSCULAR | Status: AC
  Administered 2013-09-02: 25 mg via INTRAVENOUS
  Filled 2013-09-02: qty 1

## 2013-09-02 MED ORDER — KETOROLAC TROMETHAMINE 30 MG/ML IJ SOLN
30.0000 mg | Freq: Once | INTRAMUSCULAR | Status: AC
  Administered 2013-09-02: 30 mg via INTRAVENOUS
  Filled 2013-09-02: qty 1

## 2013-09-02 MED ORDER — SODIUM CHLORIDE 0.9 % IV BOLUS (SEPSIS)
1000.0000 mL | Freq: Once | INTRAVENOUS | Status: AC
  Administered 2013-09-02: 1000 mL via INTRAVENOUS

## 2013-09-02 NOTE — ED Notes (Signed)
Patient transported to CT 

## 2013-09-02 NOTE — ED Provider Notes (Signed)
CSN: [id number]     Arrival date & time 09/01/13  2319 History   First MD Initiated Contact with Patient 09/02/13 0139     Chief Complaint  Patient presents with  . Headache   (Consider location/radiation/quality/duration/timing/severity/associated sxs/prior Treatment) HPI This is a 42 year old female with history of chronic pancreatitis on Suboxone therapy. She is a family history of migraines but no personal history of migraines. She is here with a headache that began abruptly yesterday afternoon at about 2 PM. It has been associated with photophobia, nausea and one episode of vomiting. She has taken aspirin and Phenergan without relief. She describes the pain as moderate to severe and located occipitally. She denies any focal neurologic deficit. She does have some epigastric pain but this is chronic.  Past Medical History  Diagnosis Date  . Pancreatic cyst   . Pancreatitis   . Ovarian cyst    Past Surgical History  Procedure Laterality Date  . Cholecystectomy    . Tubal ligation     History reviewed. No pertinent family history. History  Substance Use Topics  . Smoking status: Never Smoker   . Smokeless tobacco: Never Used  . Alcohol Use: No   OB History   Grav Para Term Preterm Abortions TAB SAB Ect Mult Living                 Review of Systems  All other systems reviewed and are negative.    Allergies  Zofran and Reglan  Home Medications   Current Outpatient Rx  Name  Route  Sig  Dispense  Refill  . Buprenorphine HCl-Naloxone HCl (SUBOXONE SL)   Sublingual   Place under the tongue.         . promethazine (PHENERGAN) 25 MG tablet   Oral   Take 25 mg by mouth every 6 (six) hours as needed for nausea.         Marland Kitchen HYDROcodone-acetaminophen (NORCO) 5-325 MG per tablet   Oral   Take 2 tablets by mouth every 4 (four) hours as needed.   12 tablet   0    BP 161/115  Pulse 83  Temp(Src) 98.3 F (36.8 C) (Oral)  Resp 18  SpO2 100%  Physical  Exam General: Well-developed, well-nourished female in no acute distress; appearance consistent with age of record HENT: normocephalic; atraumatic Eyes: pupils equal, round and reactive to light; extraocular muscles intact; photophobia Neck: supple Heart: regular rate and rhythm; no murmurs, rubs or gallops Lungs: clear to auscultation bilaterally Abdomen: soft; nondistended; mild epigastric tenderness; no masses or hepatosplenomegaly; bowel sounds present Extremities: No deformity; full range of motion; pulses normal Neurologic: Awake, alert and oriented; motor function intact in all extremities and symmetric; no facial droop; normal coordination and speech; normal finger to nose; negative Romberg Skin: Warm and dry Psychiatric: Normal mood and affect    ED Course  Procedures (including critical care time)   MDM  Nursing notes and vitals signs, including pulse oximetry, reviewed.  Summary of this visit's results, reviewed by myself:  Labs:  No results found for this or any previous visit (from the past 24 hour(s)).  Imaging Studies: Ct Head Wo Contrast  09/02/2013   CLINICAL DATA:  Headache with nausea  EXAM: CT HEAD WITHOUT CONTRAST  TECHNIQUE: Contiguous axial images were obtained from the base of the skull through the vertex without intravenous contrast.  COMPARISON:  Prior CT from 04/21/2004  FINDINGS: There is no acute intracranial hemorrhage or infarct. No mass lesion or  midline shift. Gray-white matter differentiation is well maintained. Ventricles are normal in size without evidence of hydrocephalus. CSF containing spaces are within normal limits. No extra-axial fluid collection.  The calvarium is intact. An ovoid osseous excrescence measuring 1.7 x 2.1 cm is noted at the right parietal scalp near the vertex.  Orbital soft tissues are within normal limits.  The paranasal sinuses and mastoid air cells are well pneumatized and free of fluid.  Scalp soft tissues are unremarkable.   IMPRESSION: Normal head CT with no acute intracranial process.   Electronically Signed   By: Rise Mu M.D.   On: 09/02/2013 02:37   3:37 AM Feeling better after IV fluid bolus, Phenergan and Toradol. Symptoms are consistent with migraine. Patient does not have a personal history of migraines but does have a strong family history.       Hanley Seamen, MD 09/02/13 [phone]

## 2013-09-02 NOTE — ED Notes (Signed)
MD at bedside. 

## 2013-10-09 ENCOUNTER — Encounter (HOSPITAL_BASED_OUTPATIENT_CLINIC_OR_DEPARTMENT_OTHER): Payer: Self-pay | Admitting: Emergency Medicine

## 2013-10-09 ENCOUNTER — Emergency Department (HOSPITAL_BASED_OUTPATIENT_CLINIC_OR_DEPARTMENT_OTHER)
Admission: EM | Admit: 2013-10-09 | Discharge: 2013-10-09 | Disposition: A | Discharge status: Home / Self Care | Payer: Medicaid Other | Attending: Emergency Medicine | Admitting: Emergency Medicine

## 2013-10-09 ENCOUNTER — Emergency Department (HOSPITAL_BASED_OUTPATIENT_CLINIC_OR_DEPARTMENT_OTHER): Payer: Medicaid Other

## 2013-10-09 DIAGNOSIS — Z79899 Other long term (current) drug therapy: Secondary | ICD-10-CM | POA: Insufficient documentation

## 2013-10-09 DIAGNOSIS — I10 Essential (primary) hypertension: Secondary | ICD-10-CM | POA: Insufficient documentation

## 2013-10-09 DIAGNOSIS — Z8742 Personal history of other diseases of the female genital tract: Secondary | ICD-10-CM | POA: Insufficient documentation

## 2013-10-09 DIAGNOSIS — Z8719 Personal history of other diseases of the digestive system: Secondary | ICD-10-CM | POA: Insufficient documentation

## 2013-10-09 DIAGNOSIS — M25579 Pain in unspecified ankle and joints of unspecified foot: Secondary | ICD-10-CM | POA: Insufficient documentation

## 2013-10-09 DIAGNOSIS — R202 Paresthesia of skin: Secondary | ICD-10-CM

## 2013-10-09 DIAGNOSIS — R209 Unspecified disturbances of skin sensation: Secondary | ICD-10-CM | POA: Insufficient documentation

## 2013-10-09 DIAGNOSIS — R0789 Other chest pain: Secondary | ICD-10-CM | POA: Insufficient documentation

## 2013-10-09 DIAGNOSIS — R110 Nausea: Secondary | ICD-10-CM | POA: Insufficient documentation

## 2013-10-09 LAB — CBC WITH DIFFERENTIAL/PLATELET
Basophils Absolute: 0 10*3/uL (ref 0.0–0.1)
Basophils Relative: 0 % (ref 0–1)
EOS ABS: 0.2 10*3/uL (ref 0.0–0.7)
EOS PCT: 2 % (ref 0–5)
HCT: 36.5 % (ref 36.0–46.0)
Hemoglobin: 12.1 g/dL (ref 12.0–15.0)
Lymphocytes Relative: 29 % (ref 12–46)
Lymphs Abs: 2.1 10*3/uL (ref 0.7–4.0)
MCH: 29.8 pg (ref 26.0–34.0)
MCHC: 33.2 g/dL (ref 30.0–36.0)
MCV: 89.9 fL (ref 78.0–100.0)
MONO ABS: 0.7 10*3/uL (ref 0.1–1.0)
Monocytes Relative: 10 % (ref 3–12)
Neutro Abs: 4.3 10*3/uL (ref 1.7–7.7)
Neutrophils Relative %: 58 % (ref 43–77)
PLATELETS: 256 10*3/uL (ref 150–400)
RBC: 4.06 MIL/uL (ref 3.87–5.11)
RDW: 12.6 % (ref 11.5–15.5)
WBC: 7.3 10*3/uL (ref 4.0–10.5)

## 2013-10-09 LAB — BASIC METABOLIC PANEL
BUN: 13 mg/dL (ref 6–23)
CO2: 25 mEq/L (ref 19–32)
Calcium: 8.7 mg/dL (ref 8.4–10.5)
Chloride: 102 mEq/L (ref 96–112)
Creatinine, Ser: 0.7 mg/dL (ref 0.50–1.10)
GFR calc Af Amer: >90 mL/min (ref >90)
GFR calc non Af Amer: >90 mL/min (ref >90)
Glucose, Bld: 94 mg/dL (ref 70–99)
Potassium: 3.8 mEq/L (ref 3.7–5.3)
Sodium: 140 mEq/L (ref 137–147)

## 2013-10-09 LAB — TROPONIN I: Troponin I: <0.3 ng/mL (ref <0.30)

## 2013-10-09 LAB — PRO B NATRIURETIC PEPTIDE: Pro B Natriuretic peptide (BNP): 25.7 pg/mL (ref 0–125)

## 2013-10-09 MED ORDER — HYDROCHLOROTHIAZIDE 25 MG PO TABS: 25.0000 mg | ORAL_TABLET | Freq: Every day | ORAL | Status: DC

## 2013-10-09 MED ORDER — NITROGLYCERIN 0.4 MG SL SUBL
0.4000 mg | SUBLINGUAL_TABLET | SUBLINGUAL | Status: AC | PRN
  Administered 2013-10-09 (×3): 0.4 mg via SUBLINGUAL
  Filled 2013-10-09 (×2): qty 25

## 2013-10-09 MED ORDER — PROMETHAZINE HCL 25 MG/ML IJ SOLN
12.5000 mg | Freq: Once | INTRAMUSCULAR | Status: AC
  Administered 2013-10-09: 12.5 mg via INTRAVENOUS
  Filled 2013-10-09: qty 1

## 2013-10-09 MED ORDER — MORPHINE SULFATE 4 MG/ML IJ SOLN
4.0000 mg | Freq: Once | INTRAMUSCULAR | Status: AC
  Administered 2013-10-09: 4 mg via INTRAVENOUS
  Filled 2013-10-09: qty 1

## 2013-10-09 MED ORDER — HYDROCODONE-ACETAMINOPHEN 5-325 MG PO TABS: 1.0000 | ORAL_TABLET | ORAL | Status: DC | PRN

## 2013-10-09 MED ORDER — PROMETHAZINE HCL 25 MG PO TABS: 25.0000 mg | ORAL_TABLET | Freq: Four times a day (QID) | ORAL | Status: DC | PRN

## 2013-10-09 NOTE — Discharge Instructions (Signed)
Chest Pain (Nonspecific) °It is often hard to give a specific diagnosis for the cause of chest pain. There is always a chance that your pain could be related to something serious, such as a heart attack or a blood clot in the lungs. You need to follow up with your caregiver for further evaluation. °CAUSES  °· Heartburn. °· Pneumonia or bronchitis. °· Anxiety or stress. °· Inflammation around your heart (pericarditis) or lung (pleuritis or pleurisy). °· A blood clot in the lung. °· A collapsed lung (pneumothorax). It can develop suddenly on its own (spontaneous pneumothorax) or from injury (trauma) to the chest. °· Shingles infection (herpes zoster virus). °The chest wall is composed of bones, muscles, and cartilage. Any of these can be the source of the pain. °· The bones can be bruised by injury. °· The muscles or cartilage can be strained by coughing or overwork. °· The cartilage can be affected by inflammation and become sore (costochondritis). °DIAGNOSIS  °Lab tests or other studies, such as X-rays, electrocardiography, stress testing, or cardiac imaging, may be needed to find the cause of your pain.  °TREATMENT  °· Treatment depends on what may be causing your chest pain. Treatment may include: °· Acid blockers for heartburn. °· Anti-inflammatory medicine. °· Pain medicine for inflammatory conditions. °· Antibiotics if an infection is present. °· You may be advised to change lifestyle habits. This includes stopping smoking and avoiding alcohol, caffeine, and chocolate. °· You may be advised to keep your head raised (elevated) when sleeping. This reduces the chance of acid going backward from your stomach into your esophagus. °· Most of the time, nonspecific chest pain will improve within 2 to 3 days with rest and mild pain medicine. °HOME CARE INSTRUCTIONS  °· If antibiotics were prescribed, take your antibiotics as directed. Finish them even if you start to feel better. °· For the next few days, avoid physical  activities that bring on chest pain. Continue physical activities as directed. °· Do not smoke. °· Avoid drinking alcohol. °· Only take over-the-counter or prescription medicine for pain, discomfort, or fever as directed by your caregiver. °· Follow your caregiver's suggestions for further testing if your chest pain does not go away. °· Keep any follow-up appointments you made. If you do not go to an appointment, you could develop lasting (chronic) problems with pain. If there is any problem keeping an appointment, you must call to reschedule. °SEEK MEDICAL CARE IF:  °· You think you are having problems from the medicine you are taking. Read your medicine instructions carefully. °· Your chest pain does not go away, even after treatment. °· You develop a rash with blisters on your chest. °SEEK IMMEDIATE MEDICAL CARE IF:  °· You have increased chest pain or pain that spreads to your arm, neck, jaw, back, or abdomen. °· You develop shortness of breath, an increasing cough, or you are coughing up blood. °· You have severe back or abdominal pain, feel nauseous, or vomit. °· You develop severe weakness, fainting, or chills. °· You have a fever. °THIS IS AN EMERGENCY. Do not wait to see if the pain will go away. Get medical help at once. Call your local emergency services (911 in [place] Do not drive yourself to the hospital. °MAKE SURE YOU:  °· Understand these instructions. °· Will watch your condition. °· Will get help right away if you are not doing well or get worse. °Document Released: 06/08/2005 Document Revised: 11/21/2011 Document Reviewed: 04/03/2008 °ExitCare® Patient Information ©2014 [employer],   LL[employer].  Chest Wall Pain Chest wall pain is pain felt in or around the chest bones and muscles. It may take up to 6 weeks to get better. It may take longer if you are active. Chest wall pain can happen on its own. Other times, things like germs, injury, coughing, or exercise can cause the pain. HOME CARE   Avoid  activities that make you tired or cause pain. Try not to use your chest, belly (abdominal), or side muscles. Do not use heavy weights.  Put ice on the sore area.  Put ice in a plastic bag.  Place a towel between your skin and the bag.  Leave the ice on for 15-20 minutes for the first 2 days.  Only take medicine as told by your doctor. GET HELP RIGHT AWAY IF:   You have more pain or are very uncomfortable.  You have a fever.  Your chest pain gets worse.  You have new problems.  You feel sick to your stomach (nauseous) or throw up (vomit).  You start to sweat or feel lightheaded.  You have a cough with mucus (phlegm).  You cough up blood. MAKE SURE YOU:   Understand these instructions.  Will watch your condition.  Will get help right away if you are not doing well or get worse. Document Released: 02/15/2008 Document Revised: 11/21/2011 Document Reviewed: 04/25/2011 [facility] Patient Information 2014 [place], [place].  Hypertension As your heart beats, it forces blood through your arteries. This force is your blood pressure. If the pressure is too high, it is called hypertension (HTN) or high blood pressure. HTN is dangerous because you may have it and not know it. High blood pressure may mean that your heart has to work harder to pump blood. Your arteries may be narrow or stiff. The extra work puts you at risk for heart disease, stroke, and other problems.  Blood pressure consists of two numbers, a higher number over a lower, 110/72, for example. It is stated as "110 over 72." The ideal is below 120 for the top number (systolic) and under 80 for the bottom (diastolic). Write down your blood pressure today. You should pay close attention to your blood pressure if you have certain conditions such as:  Heart failure.  Prior heart attack.  Diabetes  Chronic kidney disease.  Prior stroke.  Multiple risk factors for heart disease. To see if you have HTN, your blood  pressure should be measured while you are seated with your arm held at the level of the heart. It should be measured at least twice. A one-time elevated blood pressure reading (especially in the Emergency Department) does not mean that you need treatment. There may be conditions in which the blood pressure is different between your right and left arms. It is important to see your caregiver soon for a recheck. Most people have essential hypertension which means that there is not a specific cause. This type of high blood pressure may be lowered by changing lifestyle factors such as:  Stress.  Smoking.  Lack of exercise.  Excessive weight.  Drug/tobacco/alcohol use.  Eating less salt. Most people do not have symptoms from high blood pressure until it has caused damage to the body. Effective treatment can often prevent, delay or reduce that damage. TREATMENT  When a cause has been identified, treatment for high blood pressure is directed at the cause. There are a large number of medications to treat HTN. These fall into several categories, and your caregiver will help  you select the medicines that are best for you. Medications may have side effects. You should review side effects with your caregiver. If your blood pressure stays high after you have made lifestyle changes or started on medicines,   Your medication(s) may need to be changed.  Other problems may need to be addressed.  Be certain you understand your prescriptions, and know how and when to take your medicine.  Be sure to follow up with your caregiver within the time frame advised (usually within two weeks) to have your blood pressure rechecked and to review your medications.  If you are taking more than one medicine to lower your blood pressure, make sure you know how and at what times they should be taken. Taking two medicines at the same time can result in blood pressure that is too low. SEEK IMMEDIATE MEDICAL CARE IF:  You  develop a severe headache, blurred or changing vision, or confusion.  You have unusual weakness or numbness, or a faint feeling.  You have severe chest or abdominal pain, vomiting, or breathing problems. MAKE SURE YOU:   Understand these instructions.  Will watch your condition.  Will get help right away if you are not doing well or get worse. Document Released: 08/29/2005 Document Revised: 11/21/2011 Document Reviewed: 04/18/2008 [facility] Patient Information 2014 [place], [place].

## 2013-10-09 NOTE — ED Notes (Signed)
Pt c/o intermittent chest pain radiating from the left hand x 3 days. Pt also c/o pins and needles to feet and hands.

## 2013-10-09 NOTE — ED Provider Notes (Signed)
CSN: [id number]     Arrival date & time 10/09/13  1223 History   First MD Initiated Contact with Patient 10/09/13 1304     Chief Complaint  Patient presents with  . Chest Pain  . Foot Pain   (Consider location/radiation/quality/duration/timing/severity/associated sxs/prior Treatment) HPI Comments: Patient is a 43 year old female with a history of chronic pancreatitis who currently takes suboxone presents to the ED with a 3 day history of left anterior chest pain with radiation into her left arm and associated tingling.  She also reports that both her hands "feel like they are coated in jelly" as well as her feet though they both also feel prickly as well.  She reports that this pain comes and goes, nothing she does makes it worse or better.  She reports associated nausea, without vomiting, denies shortness of breath, weakness, neck pain, abdominal pain, fever, chills, cough, congestion.  She reports no previous cardiac history and that she currently does not have a PCP.  Patient is a 43 y.o. female presenting with chest pain and lower extremity pain. The history is provided by the patient. No language interpreter was used.  Chest Pain Pain location:  L chest Pain quality: hot, radiating and stabbing   Pain radiates to:  L arm Pain radiates to the back: no   Pain severity:  Moderate Onset quality:  Gradual Duration:  3 days Timing:  Constant Progression:  Worsening Chronicity:  New Context: movement   Context: not breathing, no drug use, not lifting, not raising an arm, not at rest and no stress   Relieved by:  Nothing Worsened by:  Nothing tried Ineffective treatments:  None tried Associated symptoms: nausea and numbness   Associated symptoms: no abdominal pain, no altered mental status, no anxiety, no back pain, no cough, no diaphoresis, no dizziness, no dysphagia, no fatigue, no headache, no heartburn, no near-syncope, no PND, no shortness of breath, no syncope, not vomiting and no  weakness   Foot Pain Associated symptoms include chest pain, nausea and numbness. Pertinent negatives include no abdominal pain, coughing, diaphoresis, fatigue, headaches, vomiting or weakness.    Past Medical History  Diagnosis Date  . Pancreatic cyst   . Pancreatitis   . Ovarian cyst    Past Surgical History  Procedure Laterality Date  . Cholecystectomy    . Tubal ligation     No family history on file. History  Substance Use Topics  . Smoking status: Never Smoker   . Smokeless tobacco: Never Used  . Alcohol Use: No   OB History   Grav Para Term Preterm Abortions TAB SAB Ect Mult Living                 Review of Systems  Constitutional: Negative for diaphoresis and fatigue.  HENT: Negative for trouble swallowing.   Respiratory: Negative for cough and shortness of breath.   Cardiovascular: Positive for chest pain. Negative for syncope, PND and near-syncope.  Gastrointestinal: Positive for nausea. Negative for heartburn, vomiting and abdominal pain.  Musculoskeletal: Negative for back pain.  Neurological: Positive for numbness. Negative for dizziness, weakness and headaches.  All other systems reviewed and are negative.    Allergies  Zofran and Reglan  Home Medications   Current Outpatient Rx  Name  Route  Sig  Dispense  Refill  . Buprenorphine HCl-Naloxone HCl (SUBOXONE SL)   Sublingual   Place under the tongue.         . hydrochlorothiazide (HYDRODIURIL) 25 MG tablet  Oral   Take 1 tablet (25 mg total) by mouth daily.   30 tablet   1   . HYDROcodone-acetaminophen (NORCO) 5-325 MG per tablet   Oral   Take 1 tablet by mouth every 4 (four) hours as needed.   15 tablet   0   . promethazine (PHENERGAN) 25 MG tablet   Oral   Take 1 tablet (25 mg total) by mouth every 6 (six) hours as needed for nausea.   30 tablet   0    BP 146/78  Pulse 78  Temp(Src) 98.2 F (36.8 C)  Resp 20  Ht 5\' 7"  (1.702 m)  Wt 250 lb (113.399 kg)  BMI 39.15 kg/m2   SpO2 100%  LMP 09/26/2013 Physical Exam  Nursing note and vitals reviewed. Constitutional: She is oriented to person, place, and time. She appears well-developed and well-nourished. No distress.  HENT:  Head: Normocephalic and atraumatic.  Right Ear: External ear normal.  Left Ear: External ear normal.  Nose: Nose normal.  Mouth/Throat: Oropharynx is clear and moist.  Eyes: Conjunctivae are normal. Pupils are equal, round, and reactive to light. No scleral icterus.  Neck: Normal range of motion. Neck supple. No spinous process tenderness and no muscular tenderness present.  Cardiovascular: Normal rate, regular rhythm and normal heart sounds.  Exam reveals no gallop and no friction rub.   No murmur heard. Pulmonary/Chest: Effort normal. No respiratory distress. She has no wheezes. She has no rales. She exhibits tenderness.    Abdominal: Soft. Bowel sounds are normal. She exhibits no distension. There is no tenderness. There is no rebound and no guarding.  Musculoskeletal: Normal range of motion. She exhibits no edema and no tenderness.  Lymphadenopathy:    She has no cervical adenopathy.  Neurological: She is alert and oriented to person, place, and time. She has normal strength. She displays no atrophy. No cranial nerve deficit. She exhibits normal muscle tone. Coordination normal. GCS eye subscore is 4. GCS verbal subscore is 5. GCS motor subscore is 6.  Patient reports normal sensation until hands and reports "tingling", 5/5 strength to biceps, triceps  Skin: Skin is warm and dry. No rash noted. No erythema. No pallor.  Psychiatric: She has a normal mood and affect. Her behavior is normal. Judgment and thought content normal.    ED Course  Procedures (including critical care time) Labs Review Labs Reviewed  CBC WITH DIFFERENTIAL  BASIC METABOLIC PANEL  TROPONIN I  PRO B NATRIURETIC PEPTIDE   Imaging Review Dg Chest 2 View  10/09/2013   CLINICAL DATA:  43 year old female with  chest pain radiating to the left upper extremity. Initial encounter.  EXAM: CHEST  2 VIEW  COMPARISON:  01/08/2010.  FINDINGS: Stable mild elevation of the right hemidiaphragm. Lung volumes are within normal limits. Normal cardiac size and mediastinal contours. Visualized tracheal air column is within normal limits. The lungs remain clear. No pneumothorax or effusion. No acute osseous abnormality identified.  IMPRESSION: Negative, no acute cardiopulmonary abnormality.   Electronically Signed   By: Augusto Gamble M.D.   On: 10/09/2013 13:36    EKG Interpretation    Date/Time:  Wednesday October 09 2013 12:37:48 EST Ventricular Rate:  84 PR Interval:  146 QRS Duration: 104 QT Interval:  386 QTC Calculation: 456 R Axis:   57 Text Interpretation:  Normal sinus rhythm Cannot rule out Anterior infarct , age undetermined Abnormal ECG No significant change since last tracing Confirmed by [facility]  MD, CHARLES [phone]) on 10/09/2013  1:21:53 PM           Results for orders placed during the hospital encounter of 10/09/13  CBC WITH DIFFERENTIAL      Result Value Range   WBC 7.3  4.0 - 10.5 K/uL   RBC 4.06  3.87 - 5.11 MIL/uL   Hemoglobin 12.1  12.0 - 15.0 g/dL   HCT 16.1  09.6 - 04.5 %   MCV 89.9  78.0 - 100.0 fL   MCH 29.8  26.0 - 34.0 pg   MCHC 33.2  30.0 - 36.0 g/dL   RDW 40.9  81.1 - 91.4 %   Platelets 256  150 - 400 K/uL   Neutrophils Relative % 58  43 - 77 %   Neutro Abs 4.3  1.7 - 7.7 K/uL   Lymphocytes Relative 29  12 - 46 %   Lymphs Abs 2.1  0.7 - 4.0 K/uL   Monocytes Relative 10  3 - 12 %   Monocytes Absolute 0.7  0.1 - 1.0 K/uL   Eosinophils Relative 2  0 - 5 %   Eosinophils Absolute 0.2  0.0 - 0.7 K/uL   Basophils Relative 0  0 - 1 %   Basophils Absolute 0.0  0.0 - 0.1 K/uL  BASIC METABOLIC PANEL      Result Value Range   Sodium 140  137 - 147 mEq/L   Potassium 3.8  3.7 - 5.3 mEq/L   Chloride 102  96 - 112 mEq/L   CO2 25  19 - 32 mEq/L   Glucose, Bld 94  70 - 99 mg/dL   BUN  13  6 - 23 mg/dL   Creatinine, Ser 7.82  0.50 - 1.10 mg/dL   Calcium 8.7  8.4 - 95.6 mg/dL   GFR calc non Af Amer >90  >90 mL/min   GFR calc Af Amer >90  >90 mL/min  TROPONIN I      Result Value Range   Troponin I <0.30  <0.30 ng/mL  PRO B NATRIURETIC PEPTIDE      Result Value Range   Pro B Natriuretic peptide (BNP) 25.7  0 - 125 pg/mL   Dg Chest 2 View  10/09/2013   CLINICAL DATA:  43 year old female with chest pain radiating to the left upper extremity. Initial encounter.  EXAM: CHEST  2 VIEW  COMPARISON:  01/08/2010.  FINDINGS: Stable mild elevation of the right hemidiaphragm. Lung volumes are within normal limits. Normal cardiac size and mediastinal contours. Visualized tracheal air column is within normal limits. The lungs remain clear. No pneumothorax or effusion. No acute osseous abnormality identified.  IMPRESSION: Negative, no acute cardiopulmonary abnormality.   Electronically Signed   By: Augusto Gamble M.D.   On: 10/09/2013 13:36    Medications  nitroGLYCERIN (NITROSTAT) SL tablet 0.4 mg (0.4 mg Sublingual Given 10/09/13 1402)  morphine 4 MG/ML injection 4 mg (4 mg Intravenous Given 10/09/13 1417)  promethazine (PHENERGAN) injection 12.5 mg (12.5 mg Intravenous Given 10/09/13 1417)   Filed Vitals:   10/09/13 1418  BP: 146/78  Pulse: 78  Temp:   Resp: 20     MDM   1. Musculoskeletal chest pain   2. Paresthesia   3. Hypertension    Patient with atypical chest pain, reproducible with palpation of the chest - she reports complete relief of the pain after the morphine.  Enzymes and x-rays negative and I do not suspect CAD in this chronic pain patient.  She is asked to get a PCP  to help manage her hypertension, I have started her on HCTZ for the short term, though her blood pressure is down after the pain medication.    Izola Price Marisue Humble, [place] 10/09/13 2254

## 2013-10-09 NOTE — ED Notes (Signed)
C/o nausea-EDPA notified-orders for phenergan

## 2013-10-10 NOTE — ED Provider Notes (Signed)
Medical screening examination/treatment/procedure(s) were performed by non-physician practitioner and as supervising physician I was immediately available for consultation/collaboration.  EKG Interpretation    Date/Time:  Wednesday October 09 2013 12:37:48 EST Ventricular Rate:  84 PR Interval:  146 QRS Duration: 104 QT Interval:  386 QTC Calculation: 456 R Axis:   57 Text Interpretation:  Normal sinus rhythm Cannot rule out Anterior infarct , age undetermined Abnormal ECG No significant change since last tracing [facility]  MD, CHARLES [phone] on 10/09/2013 1:21:53 PM              Leonette Mostharles B. Bernette MayersSheldon, MD 10/10/13 [id number]

## 2013-10-16 ENCOUNTER — Encounter (HOSPITAL_BASED_OUTPATIENT_CLINIC_OR_DEPARTMENT_OTHER): Payer: Self-pay | Admitting: Emergency Medicine

## 2013-10-16 ENCOUNTER — Emergency Department (HOSPITAL_BASED_OUTPATIENT_CLINIC_OR_DEPARTMENT_OTHER)
Admission: EM | Admit: 2013-10-16 | Discharge: 2013-10-16 | Disposition: A | Discharge status: Home / Self Care | Payer: Medicaid Other | Attending: Emergency Medicine | Admitting: Emergency Medicine

## 2013-10-16 DIAGNOSIS — Z8742 Personal history of other diseases of the female genital tract: Secondary | ICD-10-CM | POA: Insufficient documentation

## 2013-10-16 DIAGNOSIS — G8929 Other chronic pain: Secondary | ICD-10-CM | POA: Insufficient documentation

## 2013-10-16 DIAGNOSIS — Z79899 Other long term (current) drug therapy: Secondary | ICD-10-CM | POA: Insufficient documentation

## 2013-10-16 DIAGNOSIS — N390 Urinary tract infection, site not specified: Secondary | ICD-10-CM

## 2013-10-16 DIAGNOSIS — R112 Nausea with vomiting, unspecified: Secondary | ICD-10-CM | POA: Insufficient documentation

## 2013-10-16 DIAGNOSIS — Z8719 Personal history of other diseases of the digestive system: Secondary | ICD-10-CM | POA: Insufficient documentation

## 2013-10-16 DIAGNOSIS — Z9851 Tubal ligation status: Secondary | ICD-10-CM | POA: Insufficient documentation

## 2013-10-16 DIAGNOSIS — R1012 Left upper quadrant pain: Secondary | ICD-10-CM | POA: Insufficient documentation

## 2013-10-16 DIAGNOSIS — Z9089 Acquired absence of other organs: Secondary | ICD-10-CM | POA: Insufficient documentation

## 2013-10-16 DIAGNOSIS — R1013 Epigastric pain: Secondary | ICD-10-CM | POA: Insufficient documentation

## 2013-10-16 DIAGNOSIS — R1110 Vomiting, unspecified: Secondary | ICD-10-CM

## 2013-10-16 DIAGNOSIS — R109 Unspecified abdominal pain: Secondary | ICD-10-CM

## 2013-10-16 DIAGNOSIS — I10 Essential (primary) hypertension: Secondary | ICD-10-CM | POA: Insufficient documentation

## 2013-10-16 DIAGNOSIS — Z3202 Encounter for pregnancy test, result negative: Secondary | ICD-10-CM | POA: Insufficient documentation

## 2013-10-16 HISTORY — DX: Essential (primary) hypertension: I10

## 2013-10-16 LAB — URINALYSIS, ROUTINE W REFLEX MICROSCOPIC
BILIRUBIN URINE: NEGATIVE
Glucose, UA: NEGATIVE mg/dL
Hgb urine dipstick: NEGATIVE
Ketones, ur: NEGATIVE mg/dL
Nitrite: NEGATIVE
PROTEIN: 30 mg/dL — AB
SPECIFIC GRAVITY, URINE: 1.02 (ref 1.005–1.030)
UROBILINOGEN UA: 1 mg/dL (ref 0.0–1.0)
pH: 8.5 — ABNORMAL HIGH (ref 5.0–8.0)

## 2013-10-16 LAB — CBC WITH DIFFERENTIAL/PLATELET
Basophils Absolute: 0 10*3/uL (ref 0.0–0.1)
Basophils Relative: 0 % (ref 0–1)
EOS ABS: 0.1 10*3/uL (ref 0.0–0.7)
EOS PCT: 1 % (ref 0–5)
HCT: 38.7 % (ref 36.0–46.0)
Hemoglobin: 13 g/dL (ref 12.0–15.0)
LYMPHS ABS: 0.6 10*3/uL — AB (ref 0.7–4.0)
LYMPHS PCT: 7 % — AB (ref 12–46)
MCH: 29.7 pg (ref 26.0–34.0)
MCHC: 33.6 g/dL (ref 30.0–36.0)
MCV: 88.4 fL (ref 78.0–100.0)
MONO ABS: 0.6 10*3/uL (ref 0.1–1.0)
Monocytes Relative: 8 % (ref 3–12)
Neutro Abs: 6.7 10*3/uL (ref 1.7–7.7)
Neutrophils Relative %: 84 % — ABNORMAL HIGH (ref 43–77)
PLATELETS: 203 10*3/uL (ref 150–400)
RBC: 4.38 MIL/uL (ref 3.87–5.11)
RDW: 12.2 % (ref 11.5–15.5)
WBC: 7.9 10*3/uL (ref 4.0–10.5)

## 2013-10-16 LAB — COMPREHENSIVE METABOLIC PANEL
ALT: 80 U/L — ABNORMAL HIGH (ref 0–35)
AST: 134 U/L — ABNORMAL HIGH (ref 0–37)
Albumin: 3.7 g/dL (ref 3.5–5.2)
Alkaline Phosphatase: 98 U/L (ref 39–117)
BUN: 14 mg/dL (ref 6–23)
CALCIUM: 8.7 mg/dL (ref 8.4–10.5)
CO2: 24 meq/L (ref 19–32)
CREATININE: 0.6 mg/dL (ref 0.50–1.10)
Chloride: 100 mEq/L (ref 96–112)
GLUCOSE: 117 mg/dL — AB (ref 70–99)
Potassium: 3.8 mEq/L (ref 3.7–5.3)
Sodium: 137 mEq/L (ref 137–147)
TOTAL PROTEIN: 7.5 g/dL (ref 6.0–8.3)
Total Bilirubin: 0.7 mg/dL (ref 0.3–1.2)

## 2013-10-16 LAB — URINE MICROSCOPIC-ADD ON

## 2013-10-16 LAB — LIPASE, BLOOD: LIPASE: 77 U/L — AB (ref 11–59)

## 2013-10-16 LAB — PREGNANCY, URINE: Preg Test, Ur: NEGATIVE

## 2013-10-16 MED ORDER — PROMETHAZINE HCL 25 MG/ML IJ SOLN
25.0000 mg | Freq: Once | INTRAMUSCULAR | Status: AC
  Administered 2013-10-16: 25 mg via INTRAVENOUS
  Filled 2013-10-16: qty 1

## 2013-10-16 MED ORDER — HYDROMORPHONE HCL PF 1 MG/ML IJ SOLN
INTRAMUSCULAR | Status: AC
  Administered 2013-10-16: 1 mg via INTRAVENOUS
  Filled 2013-10-16: qty 1

## 2013-10-16 MED ORDER — HYDROMORPHONE HCL PF 1 MG/ML IJ SOLN
1.0000 mg | INTRAMUSCULAR | Status: AC | PRN
  Administered 2013-10-16 (×2): 1 mg via INTRAVENOUS
  Filled 2013-10-16: qty 1

## 2013-10-16 MED ORDER — CIPROFLOXACIN HCL 500 MG PO TABS: 500.0000 mg | ORAL_TABLET | Freq: Two times a day (BID) | ORAL | Status: DC

## 2013-10-16 MED ORDER — PROMETHAZINE HCL 25 MG RE SUPP: 25.0000 mg | Freq: Four times a day (QID) | RECTAL | Status: DC | PRN

## 2013-10-16 MED ORDER — SODIUM CHLORIDE 0.9 % IV BOLUS (SEPSIS)
1000.0000 mL | Freq: Once | INTRAVENOUS | Status: AC
  Administered 2013-10-16: 1000 mL via INTRAVENOUS

## 2013-10-16 MED ORDER — DEXTROSE 5 % IV SOLN
1.0000 g | Freq: Once | INTRAVENOUS | Status: AC
  Administered 2013-10-16: 1 g via INTRAVENOUS

## 2013-10-16 MED ORDER — SCOPOLAMINE 1 MG/3DAYS TD PT72
1.0000 | MEDICATED_PATCH | TRANSDERMAL | Status: DC
  Filled 2013-10-16: qty 1

## 2013-10-16 MED ORDER — PROMETHAZINE HCL 25 MG/ML IJ SOLN
25.0000 mg | Freq: Once | INTRAMUSCULAR | Status: AC
  Administered 2013-10-16: 25 mg via INTRAMUSCULAR
  Filled 2013-10-16: qty 1

## 2013-10-16 MED ORDER — SCOPOLAMINE 1 MG/3DAYS TD PT72
1.0000 | MEDICATED_PATCH | TRANSDERMAL | Status: DC
Start: 2013-10-16 — End: 2013-12-18

## 2013-10-16 MED ORDER — SCOPOLAMINE 1 MG/3DAYS TD PT72: 1.0000 | MEDICATED_PATCH | TRANSDERMAL | Status: DC

## 2013-10-16 MED ORDER — CEFTRIAXONE SODIUM 1 G IJ SOLR
INTRAMUSCULAR | Status: AC
  Filled 2013-10-16: qty 10

## 2013-10-16 MED ORDER — SCOPOLAMINE 1 MG/3DAYS TD PT72
1.0000 | MEDICATED_PATCH | TRANSDERMAL | Status: DC
Start: 2013-10-16 — End: 2013-10-16
  Filled 2013-10-16: qty 1

## 2013-10-16 NOTE — ED Provider Notes (Signed)
CSN: [id number]     Arrival date & time 10/16/13  0913 History   First MD Initiated Contact with Patient 10/16/13 [phone]     Chief Complaint  Patient presents with  . Abdominal Pain  . Emesis    HPI  Teresa Valenzuela presents with a chief complaint of abdominal pain and vomiting. Compensated history of cholecystectomy. Complicated by sepsis and recent admission for a 32 day hospital stay. Apparently this is the first episode of pancreatitis. Had multiple episodes of pancreatitis over a few years time. This has resulted in somewhat chronic abdominal pain. She is on Suboxone 16 mg per day and functions well on this. Started with nausea and vomiting last night. This persisted through the night she is epigastric and back pain this morning. Was not able tolerate her Suboxone last night or this morning because of her vomiting. Heme-negative nonbilious emesis. Normal bowel movements. Some back pain. No irritant voiding symptoms or dysuria or frequency. No fever  Past Medical History  Diagnosis Date  . Pancreatic cyst   . Pancreatitis   . Ovarian cyst   . Hypertension    Past Surgical History  Procedure Laterality Date  . Cholecystectomy    . Tubal ligation     No family history on file. History  Substance Use Topics  . Smoking status: Never Smoker   . Smokeless tobacco: Never Used  . Alcohol Use: No   OB History   Grav Para Term Preterm Abortions TAB SAB Ect Mult Living                 Review of Systems  Constitutional: Negative for fever, chills, diaphoresis, appetite change and fatigue.  HENT: Negative for mouth sores, sore throat and trouble swallowing.   Eyes: Negative for visual disturbance.  Respiratory: Negative for cough, chest tightness, shortness of breath and wheezing.   Cardiovascular: Negative for chest pain.  Gastrointestinal: Positive for vomiting and abdominal pain. Negative for nausea, diarrhea and abdominal distention.  Endocrine: Negative for polydipsia, polyphagia and  polyuria.  Genitourinary: Negative for dysuria, frequency and hematuria.  Musculoskeletal: Positive for back pain. Negative for gait problem.  Skin: Negative for color change, pallor and rash.  Neurological: Negative for dizziness, syncope, light-headedness and headaches.  Hematological: Does not bruise/bleed easily.  Psychiatric/Behavioral: Negative for behavioral problems and confusion.    Allergies  Zofran and Reglan  Home Medications   Current Outpatient Rx  Name  Route  Sig  Dispense  Refill  . Buprenorphine HCl-Naloxone HCl (SUBOXONE SL)   Sublingual   Place under the tongue.         . ciprofloxacin (CIPRO) 500 MG tablet   Oral   Take 1 tablet (500 mg total) by mouth every 12 (twelve) hours.   10 tablet   0   . hydrochlorothiazide (HYDRODIURIL) 25 MG tablet   Oral   Take 1 tablet (25 mg total) by mouth daily.   30 tablet   1   . HYDROcodone-acetaminophen (NORCO) 5-325 MG per tablet   Oral   Take 1 tablet by mouth every 4 (four) hours as needed.   15 tablet   0   . promethazine (PHENERGAN) 25 MG suppository   Rectal   Place 1 suppository (25 mg total) rectally every 6 (six) hours as needed for nausea or vomiting.   4 each   0   . promethazine (PHENERGAN) 25 MG tablet   Oral   Take 1 tablet (25 mg total) by mouth every 6 (  six) hours as needed for nausea.   30 tablet   0    BP 140/86  Pulse 101  Temp(Src) 98.7 F (37.1 C) (Oral)  Resp 18  Ht 5\' 7"  (1.702 m)  Wt 250 lb (113.399 kg)  BMI 39.15 kg/m2  SpO2 98%  LMP 09/26/2013 Physical Exam  Constitutional: She is oriented to person, place, and time. She appears well-developed and well-nourished. She appears distressed.  Crying. Holds her hands across her upper abdomen. Appears uncomfortable.  HENT:  Head: Normocephalic.  Eyes: Conjunctivae are normal. Pupils are equal, round, and reactive to light. No scleral icterus.  No scleral icterus. No conjunctival injection.  Neck: Normal range of motion.  Neck supple. No thyromegaly present.  Cardiovascular: Normal rate and regular rhythm.  Exam reveals no gallop and no friction rub.   No murmur heard. Pulmonary/Chest: Effort normal and breath sounds normal. No respiratory distress. She has no wheezes. She has no rales.  Abdominal: Soft. Bowel sounds are normal. She exhibits no distension. There is no tenderness. There is no rebound.  Abdomen soft. Tenderness in the epigastrium and left upper quadrant. No peritoneal irritation. No vesicles or zoster.  Musculoskeletal: Normal range of motion.  Neurological: She is alert and oriented to person, place, and time.  Skin: Skin is warm and dry. No rash noted.  Psychiatric: She has a normal mood and affect. Her behavior is normal.    ED Course  Procedures (including critical care time) Labs Review Labs Reviewed  URINALYSIS, ROUTINE W REFLEX MICROSCOPIC - Abnormal; Notable for the following:    APPearance CLOUDY (*)    pH 8.5 (*)    Protein, ur 30 (*)    Leukocytes, UA MODERATE (*)    All other components within normal limits  CBC WITH DIFFERENTIAL - Abnormal; Notable for the following:    Neutrophils Relative % 84 (*)    Lymphocytes Relative 7 (*)    Lymphs Abs 0.6 (*)    All other components within normal limits  COMPREHENSIVE METABOLIC PANEL - Abnormal; Notable for the following:    Glucose, Bld 117 (*)    AST 134 (*)    ALT 80 (*)    All other components within normal limits  LIPASE, BLOOD - Abnormal; Notable for the following:    Lipase 77 (*)    All other components within normal limits  URINE MICROSCOPIC-ADD ON - Abnormal; Notable for the following:    Squamous Epithelial / LPF MANY (*)    Bacteria, UA MANY (*)    All other components within normal limits  URINE CULTURE  PREGNANCY, URINE   Imaging Review No results found.  EKG Interpretation   None       MDM   1. Vomiting   2. Abdominal pain   3. Urinary tract infection    The blood cell count is normal. Urine  shows signs of infection. Culture requested. After the first dose of Phenergan and Dilaudid her pain is better, her nausea is resolved. She is tolerating some by mouth liquids. Given IV Rocephin. Plan is treatment with Cipro for any tract infection. Phenergan suppositories as needed for at home. Stay hydrated   Rolland Porter, MD 10/16/13 [phone]

## 2013-10-16 NOTE — ED Notes (Signed)
Abdominal pain and vomiting that started last night.

## 2013-10-16 NOTE — Discharge Instructions (Signed)
Abdominal Pain, Adult Many things can cause abdominal pain. Usually, abdominal pain is not caused by a disease and will improve without treatment. It can often be observed and treated at home. Your health care provider will do a physical exam and possibly order blood tests and X-rays to help determine the seriousness of your pain. However, in many cases, more time must pass before a clear cause of the pain can be found. Before that point, your health care provider may not know if you need more testing or further treatment. HOME CARE INSTRUCTIONS  Monitor your abdominal pain for any changes. The following actions may help to alleviate any discomfort you are experiencing:  Only take over-the-counter or prescription medicines as directed by your health care provider.  Do not take laxatives unless directed to do so by your health care provider.  Try a clear liquid diet (broth, tea, or water) as directed by your health care provider. Slowly move to a bland diet as tolerated. SEEK MEDICAL CARE IF:  You have unexplained abdominal pain.  You have abdominal pain associated with nausea or diarrhea.  You have pain when you urinate or have a bowel movement.  You experience abdominal pain that wakes you in the night.  You have abdominal pain that is worsened or improved by eating food.  You have abdominal pain that is worsened with eating fatty foods. SEEK IMMEDIATE MEDICAL CARE IF:   Your pain does not go away within 2 hours.  You have a fever.  You keep throwing up (vomiting).  Your pain is felt only in portions of the abdomen, such as the right side or the left lower portion of the abdomen.  You pass bloody or black tarry stools. MAKE SURE YOU:  Understand these instructions.   Will watch your condition.   Will get help right away if you are not doing well or get worse.  Document Released: 06/08/2005 Document Revised: 06/19/2013 Document Reviewed: 05/08/2013 [facility] Patient  Information 2014 [place].  Nausea and Vomiting Nausea is a sick feeling that often comes before throwing up (vomiting). Vomiting is a reflex where stomach contents come out of your mouth. Vomiting can cause severe loss of body fluids (dehydration). Children and elderly adults can become dehydrated quickly, especially if they also have diarrhea. Nausea and vomiting are symptoms of a condition or disease. It is important to find the cause of your symptoms. CAUSES   Direct irritation of the stomach lining. This irritation can result from increased acid production (gastroesophageal reflux disease), infection, food poisoning, taking certain medicines (such as nonsteroidal anti-inflammatory drugs), alcohol use, or tobacco use.  Signals from the brain.These signals could be caused by a headache, heat exposure, an inner ear disturbance, increased pressure in the brain from injury, infection, a tumor, or a concussion, pain, emotional stimulus, or metabolic problems.  An obstruction in the gastrointestinal tract (bowel obstruction).  Illnesses such as diabetes, hepatitis, gallbladder problems, appendicitis, kidney problems, cancer, sepsis, atypical symptoms of a heart attack, or eating disorders.  Medical treatments such as chemotherapy and radiation.  Receiving medicine that makes you sleep (general anesthetic) during surgery. DIAGNOSIS Your caregiver may ask for tests to be done if the problems do not improve after a few days. Tests may also be done if symptoms are severe or if the reason for the nausea and vomiting is not clear. Tests may include:  Urine tests.  Blood tests.  Stool tests.  Cultures (to look for evidence of infection).  X-rays or  other imaging studies. Test results can help your caregiver make decisions about treatment or the need for additional tests. TREATMENT You need to stay well hydrated. Drink frequently but in small amounts.You may wish to drink water, sports  drinks, clear broth, or eat frozen ice pops or gelatin dessert to help stay hydrated.When you eat, eating slowly may help prevent nausea.There are also some antinausea medicines that may help prevent nausea. HOME CARE INSTRUCTIONS   Take all medicine as directed by your caregiver.  If you do not have an appetite, do not force yourself to eat. However, you must continue to drink fluids.  If you have an appetite, eat a normal diet unless your caregiver tells you differently.  Eat a variety of complex carbohydrates (rice, wheat, potatoes, bread), lean meats, yogurt, fruits, and vegetables.  Avoid high-fat foods because they are more difficult to digest.  Drink enough water and fluids to keep your urine clear or pale yellow.  If you are dehydrated, ask your caregiver for specific rehydration instructions. Signs of dehydration may include:  Severe thirst.  Dry lips and mouth.  Dizziness.  Dark urine.  Decreasing urine frequency and amount.  Confusion.  Rapid breathing or pulse. SEEK IMMEDIATE MEDICAL CARE IF:   You have blood or brown flecks (like coffee grounds) in your vomit.  You have black or bloody stools.  You have a severe headache or stiff neck.  You are confused.  You have severe abdominal pain.  You have chest pain or trouble breathing.  You do not urinate at least once every 8 hours.  You develop cold or clammy skin.  You continue to vomit for longer than 24 to 48 hours.  You have a fever. MAKE SURE YOU:   Understand these instructions.  Will watch your condition.  Will get help right away if you are not doing well or get worse. Document Released: 08/29/2005 Document Revised: 11/21/2011 Document Reviewed: 01/26/2011 [facility] Patient Information 2014 [place], [place].  Urinary Tract Infection Urinary tract infections (UTIs) can develop anywhere along your urinary tract. Your urinary tract is your body's drainage system for removing wastes and extra  water. Your urinary tract includes two kidneys, two ureters, a bladder, and a urethra. Your kidneys are a pair of bean-shaped organs. Each kidney is about the size of your fist. They are located below your ribs, one on each side of your spine. CAUSES Infections are caused by microbes, which are microscopic organisms, including fungi, viruses, and bacteria. These organisms are so small that they can only be seen through a microscope. Bacteria are the microbes that most commonly cause UTIs. SYMPTOMS  Symptoms of UTIs may vary by age and gender of the patient and by the location of the infection. Symptoms in young women typically include a frequent and intense urge to urinate and a painful, burning feeling in the bladder or urethra during urination. Older women and men are more likely to be tired, shaky, and weak and have muscle aches and abdominal pain. A fever may mean the infection is in your kidneys. Other symptoms of a kidney infection include pain in your back or sides below the ribs, nausea, and vomiting. DIAGNOSIS To diagnose a UTI, your caregiver will ask you about your symptoms. Your caregiver also will ask to provide a urine sample. The urine sample will be tested for bacteria and white blood cells. White blood cells are made by your body to help fight infection. TREATMENT  Typically, UTIs can be treated with medication.  Because most UTIs are caused by a bacterial infection, they usually can be treated with the use of antibiotics. The choice of antibiotic and length of treatment depend on your symptoms and the type of bacteria causing your infection. HOME CARE INSTRUCTIONS  If you were prescribed antibiotics, take them exactly as your caregiver instructs you. Finish the medication even if you feel better after you have only taken some of the medication.  Drink enough water and fluids to keep your urine clear or pale yellow.  Avoid caffeine, tea, and carbonated beverages. They tend to irritate  your bladder.  Empty your bladder often. Avoid holding urine for long periods of time.  Empty your bladder before and after sexual intercourse.  After a bowel movement, women should cleanse from front to back. Use each tissue only once. SEEK MEDICAL CARE IF:   You have back pain.  You develop a fever.  Your symptoms do not begin to resolve within 3 days. SEEK IMMEDIATE MEDICAL CARE IF:   You have severe back pain or lower abdominal pain.  You develop chills.  You have nausea or vomiting.  You have continued burning or discomfort with urination. MAKE SURE YOU:   Understand these instructions.  Will watch your condition.  Will get help right away if you are not doing well or get worse. Document Released: 06/08/2005 Document Revised: 02/28/2012 Document Reviewed: 10/07/2011 [facility] Patient Information 2014 [place]

## 2013-10-17 LAB — URINE CULTURE
Colony Count: NO GROWTH
Culture: NO GROWTH

## 2013-12-18 ENCOUNTER — Emergency Department (HOSPITAL_COMMUNITY)
Admission: EM | Admit: 2013-12-18 | Discharge: 2013-12-18 | Disposition: A | Discharge status: Home / Self Care | Payer: Medicaid Other | Attending: Emergency Medicine | Admitting: Emergency Medicine

## 2013-12-18 ENCOUNTER — Encounter (HOSPITAL_COMMUNITY): Payer: Self-pay | Admitting: Emergency Medicine

## 2013-12-18 DIAGNOSIS — R300 Dysuria: Secondary | ICD-10-CM | POA: Insufficient documentation

## 2013-12-18 DIAGNOSIS — Z9089 Acquired absence of other organs: Secondary | ICD-10-CM | POA: Insufficient documentation

## 2013-12-18 DIAGNOSIS — N898 Other specified noninflammatory disorders of vagina: Secondary | ICD-10-CM | POA: Insufficient documentation

## 2013-12-18 DIAGNOSIS — G8929 Other chronic pain: Secondary | ICD-10-CM

## 2013-12-18 DIAGNOSIS — N949 Unspecified condition associated with female genital organs and menstrual cycle: Secondary | ICD-10-CM | POA: Insufficient documentation

## 2013-12-18 DIAGNOSIS — I10 Essential (primary) hypertension: Secondary | ICD-10-CM | POA: Insufficient documentation

## 2013-12-18 DIAGNOSIS — Z8719 Personal history of other diseases of the digestive system: Secondary | ICD-10-CM | POA: Insufficient documentation

## 2013-12-18 DIAGNOSIS — M545 Low back pain, unspecified: Secondary | ICD-10-CM | POA: Insufficient documentation

## 2013-12-18 DIAGNOSIS — R112 Nausea with vomiting, unspecified: Secondary | ICD-10-CM | POA: Insufficient documentation

## 2013-12-18 DIAGNOSIS — Z3202 Encounter for pregnancy test, result negative: Secondary | ICD-10-CM | POA: Insufficient documentation

## 2013-12-18 DIAGNOSIS — Z9851 Tubal ligation status: Secondary | ICD-10-CM | POA: Insufficient documentation

## 2013-12-18 DIAGNOSIS — Z79899 Other long term (current) drug therapy: Secondary | ICD-10-CM | POA: Insufficient documentation

## 2013-12-18 DIAGNOSIS — R109 Unspecified abdominal pain: Secondary | ICD-10-CM | POA: Insufficient documentation

## 2013-12-18 LAB — CBC WITH DIFFERENTIAL/PLATELET
BASOS PCT: 0 % (ref 0–1)
Basophils Absolute: 0 10*3/uL (ref 0.0–0.1)
EOS ABS: 0.2 10*3/uL (ref 0.0–0.7)
EOS PCT: 2 % (ref 0–5)
HCT: 36.9 % (ref 36.0–46.0)
HEMOGLOBIN: 12.4 g/dL (ref 12.0–15.0)
Lymphocytes Relative: 27 % (ref 12–46)
Lymphs Abs: 2.9 10*3/uL (ref 0.7–4.0)
MCH: 29.4 pg (ref 26.0–34.0)
MCHC: 33.6 g/dL (ref 30.0–36.0)
MCV: 87.4 fL (ref 78.0–100.0)
MONO ABS: 0.7 10*3/uL (ref 0.1–1.0)
MONOS PCT: 6 % (ref 3–12)
Neutro Abs: 6.7 10*3/uL (ref 1.7–7.7)
Neutrophils Relative %: 64 % (ref 43–77)
Platelets: 273 10*3/uL (ref 150–400)
RBC: 4.22 MIL/uL (ref 3.87–5.11)
RDW: 12.5 % (ref 11.5–15.5)
WBC: 10.5 10*3/uL (ref 4.0–10.5)

## 2013-12-18 LAB — URINALYSIS, ROUTINE W REFLEX MICROSCOPIC
BILIRUBIN URINE: NEGATIVE
GLUCOSE, UA: NEGATIVE mg/dL
HGB URINE DIPSTICK: NEGATIVE
KETONES UR: NEGATIVE mg/dL
Nitrite: NEGATIVE
PH: 5.5 (ref 5.0–8.0)
PROTEIN: NEGATIVE mg/dL
Specific Gravity, Urine: 1.023 (ref 1.005–1.030)
Urobilinogen, UA: 0.2 mg/dL (ref 0.0–1.0)

## 2013-12-18 LAB — COMPREHENSIVE METABOLIC PANEL
ALK PHOS: 71 U/L (ref 39–117)
ALT: 17 U/L (ref 0–35)
AST: 18 U/L (ref 0–37)
Albumin: 3.5 g/dL (ref 3.5–5.2)
BUN: 12 mg/dL (ref 6–23)
CO2: 26 meq/L (ref 19–32)
Calcium: 8.8 mg/dL (ref 8.4–10.5)
Chloride: 100 mEq/L (ref 96–112)
Creatinine, Ser: 0.57 mg/dL (ref 0.50–1.10)
GFR calc non Af Amer: >90 mL/min (ref >90)
Glucose, Bld: 92 mg/dL (ref 70–99)
POTASSIUM: 4 meq/L (ref 3.7–5.3)
SODIUM: 137 meq/L (ref 137–147)
TOTAL PROTEIN: 7.2 g/dL (ref 6.0–8.3)
Total Bilirubin: 0.3 mg/dL (ref 0.3–1.2)

## 2013-12-18 LAB — URINE MICROSCOPIC-ADD ON

## 2013-12-18 LAB — WET PREP, GENITAL
Clue Cells Wet Prep HPF POC: NONE SEEN
Trich, Wet Prep: NONE SEEN
Yeast Wet Prep HPF POC: NONE SEEN

## 2013-12-18 LAB — POC URINE PREG, ED: Preg Test, Ur: NEGATIVE

## 2013-12-18 LAB — LIPASE, BLOOD: Lipase: 40 U/L (ref 11–59)

## 2013-12-18 LAB — HIV ANTIBODY (ROUTINE TESTING W REFLEX): HIV 1&2 Ab, 4th Generation: NONREACTIVE

## 2013-12-18 MED ORDER — PROMETHAZINE HCL 25 MG RE SUPP: 25.0000 mg | Freq: Four times a day (QID) | RECTAL | Status: DC | PRN

## 2013-12-18 MED ORDER — HYDROMORPHONE HCL PF 1 MG/ML IJ SOLN
1.0000 mg | Freq: Once | INTRAMUSCULAR | Status: AC
  Administered 2013-12-18: 1 mg via INTRAVENOUS
  Filled 2013-12-18: qty 1

## 2013-12-18 MED ORDER — PROMETHAZINE HCL 25 MG/ML IJ SOLN
25.0000 mg | Freq: Once | INTRAMUSCULAR | Status: AC
  Administered 2013-12-18: 25 mg via INTRAVENOUS
  Filled 2013-12-18: qty 1

## 2013-12-18 NOTE — Discharge Instructions (Signed)
Abdominal Pain, Adult °Many things can cause belly (abdominal) pain. Most times, the belly pain is not dangerous. Many cases of belly pain can be watched and treated at home. °HOME CARE  °· Do not take medicines that help you go poop (laxatives) unless told to by your doctor. °· Only take medicine as told by your doctor. °· Eat or drink as told by your doctor. Your doctor will tell you if you should be on a special diet. °GET HELP IF: °· You do not know what is causing your belly pain. °· You have belly pain while you are sick to your stomach (nauseous) or have runny poop (diarrhea). °· You have pain while you pee or poop. °· Your belly pain wakes you up at night. °· You have belly pain that gets worse or better when you eat. °· You have belly pain that gets worse when you eat fatty foods. °GET HELP RIGHT AWAY IF:  °· The pain does not go away within 2 hours. °· You have a fever. °· You keep throwing up (vomiting). °· The pain changes and is only in the right or left part of the belly. °· You have bloody or tarry looking poop. °MAKE SURE YOU:  °· Understand these instructions. °· Will watch your condition. °· Will get help right away if you are not doing well or get worse. °Document Released: 02/15/2008 Document Revised: 06/19/2013 Document Reviewed: 05/08/2013 °ExitCare® Patient Information ©2014 [employer]. ° ° ° °Emergency Department Resource Guide °1) Find a Doctor and Pay Out of Pocket °Although you won't have to find out who is covered by your insurance plan, it is a good idea to ask around and get recommendations. You will then need to call the office and see if the doctor you have chosen will accept you as a new patient and what types of options they offer for patients who are self-pay. Some doctors offer discounts or will set up payment plans for their patients who do not have insurance, but you will need to ask so you aren't surprised when you get to your appointment. ° °2) Contact Your Local Health  Department °Not all health departments have doctors that can see patients for sick visits, but many do, so it is worth a call to see if yours does. If you don't know where your local health department is, you can check in your phone book. The CDC also has a tool to help you locate your state's health department, and many state websites also have listings of all of their local health departments. ° °3) Find a Walk-in Clinic °If your illness is not likely to be very severe or complicated, you may want to try a walk in clinic. These are popping up all over the country in pharmacies, drugstores, and shopping centers. They're usually staffed by nurse practitioners or physician assistants that have been trained to treat common illnesses and complaints. They're usually fairly quick and inexpensive. However, if you have serious medical issues or chronic medical problems, these are probably not your best option. ° °No Primary Care Doctor: °- Call [facility] at  832-8000 - they can help you locate a primary care doctor that  accepts your insurance, provides certain services, etc. °- Physician Referral Service- 1-800-533-3463 ° °Chronic Pain Problems: °Organization         Address  Phone   Notes  ° [facility]  (336) 297-2271 Patients need to be referred by their primary care doctor.  ° °  Medication Assistance: °Organization         Address  Phone   Notes  °[place] [facility] [address] °[place], [place] [address] (336) 641-8030 --Must be a resident of [place] °-- Must have NO insurance coverage whatsoever (no Medicaid/ Medicare, etc.) °-- The pt. MUST have a primary care doctor that directs their care regularly and follows them in the community °  °[facility]  (866) 331-1348   °[employer]  (888) 892-1162   ° °Agencies that provide inexpensive medical care: °Organization         Address  Phone   Notes  °[facility]  (336) 832-8035   °[facility]    (336) 832-7272   °[facility] [address] °[place], [place] [address] (336) 832-4777   °[facility] [address], °[place] (336) 271-4999   °[facility]    (336) 373-0678   °[facility]    (336) 272-1050   °[facility] ° [address], [place] Phone:  (336) 832-4444, Fax:  (336) 832-4440 Hours of Operation:  9 am - 6 pm, M-F.  Also accepts Medicaid/Medicare and self-pay.  °[facility] ° [address], [place] Phone: (336) 832-3150, Fax: (336) 832-3151. Hours of Operation:  8:30 am - 5:30 pm, M-F.  Also accepts Medicaid and self-pay.  °[facility] [place] [address], [place] Phone: (336) 878-6027   °[facility] [address], [place], [place] (336)723-1848, Ext. 123 Mondays & Thursdays: 7-9 AM.  First 15 patients are seen on a first come, first serve basis. °  ° °Medicaid-accepting [place] Providers: ° °Organization         Address  Phone   Notes  °[facility] [address], [place] (336) 641-2100 Also accepts self-pay patients.  °[facility] [address], [place] ° (336) 856-9996   °[facility] [address], [place] (336) 288-8857   °[facility] [address] [place] [address], [place] (336) 299-7000   °Veita Bland [address], [place]  ° (336) 373-1557 Only accepts [place] Access Medicaid patients after they have their name applied to their card.  ° °Self-Pay (no insurance) in [place]: ° °Organization         Address  Phone   Notes  °Sickle Cell Patients, [facility] [address], [place] (336) 832-1970   °[facility] [address], [place] (336) 832-4400   °[facility] ° [address],   (336) 992-4800   °[facility]/Dr. Osei-Bonsu ° [address] [place] [address], [place] or [address], [place] (336) 841-8500 Phone number for both [place] and [place] locations is the same.  °[facility] [address], [place] (336) 299-0000   °[facility] [address] [place] [address], [place] or [address] (336) 852-7530 °(336) 878-2260   °[facility] [address], [place] (336) 350-1642, phone; (336) 294-5005, fax Sees patients 1st and 3rd Saturday of every month.  Must not qualify for public or private insurance (i.e. Medicaid, Medicare, [place] Health Choice, Veterans' Benefits) • Household income should be no more than 200% of the poverty level •The   clinic cannot treat you if you are pregnant or think you are pregnant • Sexually transmitted diseases are not treated at the clinic.  ° ° °Dental Care: °Organization         Address  Phone  Notes  °[place] [facility] [address], [place] (336) 641-6152 Accepts children up to age 21 who are enrolled in Medicaid or [place] Health Choice; pregnant women with a Medicaid card; and children who have applied for Medicaid or [place] Health Choice, but were declined, whose parents can pay a reduced fee at time of service.  °[facility] [place]  [address], [place] (336) 641-7733 Accepts children up to age 21 who are enrolled in Medicaid or [place] Health Choice; pregnant women with a Medicaid card; and children who have applied for Medicaid or [place] Health Choice, but were declined, whose parents can pay a reduced fee at time of service.  °[facility] ° [address], [place] (336) 641-4533 Patients are seen by appointment only. Walk-ins are not accepted. [facility] will see patients 18 years of age and older. °Monday - Tuesday (8am-5pm) °Most Wednesdays  (8:30-5pm) °$30 per visit, cash only  °[facility] ° [address], [place] (336) 641-4533 Patients are seen by appointment only. Walk-ins are not accepted. [facility] will see patients 18 years of age and older. °One Wednesday Evening (Monthly: Volunteer Based).  $30 per visit, cash only  °[facility]  (919) 537-3737 for adults; Children under age 4, call Graduate Pediatric Dentistry at (919) [phone]. Children aged 4-14, please call (919) [phone] to request a pediatric application. ° Dental services are provided in all areas of dental care including fillings, crowns and bridges, complete and partial dentures, implants, gum treatment, root canals, and extractions. Preventive care is also provided. Treatment is provided to both adults and children. °Patients are selected via a lottery and there is often a waiting list. °  °[facility] [address], °[place] ° (336) 763-8833 [email or web address] °  °[facility] [address], [place], [place] (336)723-1848, Ext. 123 Second and Fourth Thursday of each month, opens at 6:30 AM; Clinic ends at 9 AM.  Patients are seen on a first-come first-served basis, and a limited number are seen during each clinic.  ° °[facility] ° [address], [place], [place] (336) 723-7904   Eligibility Requirements °You must have lived in [place], [place], or [place] counties for at least the last three months. °  You cannot be eligible for state or federal sponsored healthcare insurance, including Veterans Administration, Medicaid, or Medicare. °  You generally cannot be eligible for healthcare insurance through your employer.  °  How to apply: °Eligibility screenings are held every Tuesday and Wednesday afternoon from 1:00 pm until 4:00 pm. You do not need an appointment for the interview!  °[facility] [address], [place], [place] 336-631-2330   °[place]  [facility]  336-342-8273   °[place] [facility]  336-703-3100   °[facility]  336-570-6415   ° °Behavioral Health Resources in the Community: °Intensive Outpatient Programs °Organization         Address  Phone  Notes  °[facility] [address], [place], [place] 336-878-6098   °[facility] [address], [place],   [place] 336-832-9800   °[facility] [address], [place], [place] ° 336-882-2125   °[facility] [address],  °[place], [place] 1-800-853-5163 or 336-641-4981   °Substance Abuse Resources °Organization         Address  Phone  Notes  °[facility]  336-882-2125   °[facility]  336-784-9470   °[facility]  336-285-9073   °[facility]  336-845-3988   °Residential & Outpatient Substance Abuse Program  1-800-659-3381   °Psychological Services °Organization         Address  Phone  Notes  °[facility]  336- 832-9600   °[facility]  336- 378-7881   °[facility] [address], [place] 1-800-853-5163 or 336-641-4981   ° °Mobile Crisis Teams °Organization         Address  Phone  Notes  °[facility], Mobile Crisis Care Unit  1-877-626-1772   °Assertive °[facility] ° [address] [place], [place] 336-834-9664   °Sharon DeEsch [address] °[place] 336-554-5454   ° °Self-Help/Support Groups °Organization         Address  Phone             Notes  °[facility] [place] - variety of support groups  336- 373-1402 Call for more information  °Narcotics Anonymous (NA), [facility] [address], °[place]  2 meetings at this location  ° °Residential Treatment Programs °Organization         Address  Phone  Notes  °[facility] [address],    °[place]  1-866-801-8205   °[facility] ° [address], [place], [place]  704-293-8524   °[facility] [address], [place] 336-845-3988 Admissions: 8am-3pm M-F  °[facility] [address]    °[place], [place] 336-841-1104   °[facility] [address], [place], [place] 336-379-7146   °[facility] [address]  °[place], [place] 336-285-9073   °[facility] Intensive Outpatient [address], [place], [place] 336-852-3033   °[facility] [address]  °[place], [place] 1-877-615-2722 or 336-784-9470   °[facility] [address] [place], [place] 336-227-7417 Accepts Medicaid  °[facility] [address]  ° [place] 1-800-659-3381 Substance Abuse/Addiction Treatment  ° °[place] Behavioral Health Resources °Organization         Address  Phone  Notes  °[facility]  (888) 581-9988   °Julie Brannon, PhD [address] [place], [place]   (336) 349-5553 or (336) 951-0000   °[facility]   [address] °[place], [place] (336) 349-4454   °[facility] [address], [place], [place] (336) 342-8316 Insurance/Medicaid/sponsorship through [facility]  °[facility] [address]                                    [place], [place] (336) 342-8316 Therapy/tele-psych/case  °[facility] [address]  ° [place],  (336) 349-2233    °Dr. Arfeen  (336) 349-4544   °[facility] [place]  [employer] [place] [facility] 1) [address], [place] °2) [address], [place] °3)  [address], [place] (336) 349-3220 °(336) 342-7768 ° °(  336) 342-8140   °[place] Child Abuse Hotline (336) 342-1394 or (336) 342-3537 (After Hours)    ° °  °  [place]  [facility] 1) [address], [place] 2) [address] [place] 3)  [address], [place] 878-001-5769 (337) 034-7959  240-810-2201   [facility] Child Abuse Hotline 820-323-1878 or (571)655-2984 (After Hours)

## 2013-12-18 NOTE — ED Notes (Signed)
Pt states hx of ovarian cysts. C/o gen abd/ flank pain.  Threw up once last night.  Denies diarrhea.  Also dysuria.  All sx started about 4 days ago.

## 2013-12-18 NOTE — Progress Notes (Signed)
[facility] provided pt with a list of primary care resources and a [facility] [place] Card application to help patient establish primary care.  °

## 2013-12-18 NOTE — ED Notes (Signed)
Patient couldn't give a large enough urine sample to test. Patient will try to use restroom again in a few.

## 2013-12-18 NOTE — ED Provider Notes (Signed)
CSN: [id number]     Arrival date & time 12/18/13  1033 History   First MD Initiated Contact with Patient 12/18/13 1115     Chief Complaint  Patient presents with  . Abdominal Pain     (Consider location/radiation/quality/duration/timing/severity/associated sxs/prior Treatment) HPI Pt is a 43yo female with hx of pancreatic cyst, pancreatitis, ovarian cysts and HTN presenting today c/o gradually worsening left sided abdominal pain that is constant, sharp, 6/10, associated with nausea and vomiting x1 w/o.  Pt also reports vaginal pain and discharge with dysuria that is "like pressure" but denies hematuria.  Denies fever but reports cold chills and loss of appetite.  Symptoms started 4 days ago. Reports has not been able to take her Suboxone as she has been nauseated and out of her phenergan.  Pt does not have a GI specialist or PCP as she is in process of getting medicaid.    Past Medical History  Diagnosis Date  . Pancreatic cyst   . Pancreatitis   . Ovarian cyst   . Hypertension    Past Surgical History  Procedure Laterality Date  . Cholecystectomy    . Tubal ligation     History reviewed. No pertinent family history. History  Substance Use Topics  . Smoking status: Never Smoker   . Smokeless tobacco: Never Used  . Alcohol Use: No   OB History   Grav Para Term Preterm Abortions TAB SAB Ect Mult Living                 Review of Systems  Constitutional: Positive for chills and appetite change. Negative for fever, diaphoresis and fatigue.  Respiratory: Negative for shortness of breath.   Cardiovascular: Negative for chest pain.  Gastrointestinal: Positive for nausea, vomiting and abdominal pain. Negative for diarrhea and constipation.  Genitourinary: Positive for dysuria ( pressure), flank pain ( left), vaginal discharge, vaginal pain and pelvic pain. Negative for urgency, frequency, hematuria, decreased urine volume, vaginal bleeding and menstrual problem.  Musculoskeletal:  Positive for back pain ( lower). Negative for myalgias.  All other systems reviewed and are negative.     Allergies  Zofran and Reglan  Home Medications   Current Outpatient Rx  Name  Route  Sig  Dispense  Refill  . Buprenorphine HCl-Naloxone HCl (SUBOXONE SL)   Sublingual   Place 1 tablet under the tongue daily.          . promethazine (PHENERGAN) 25 MG suppository   Rectal   Place 1 suppository (25 mg total) rectally every 6 (six) hours as needed for nausea or vomiting.   12 each   0    BP 161/107  Pulse 95  Temp(Src) 98.6 F (37 C) (Oral)  Resp 20  SpO2 96%  LMP 12/10/2013 Physical Exam  Nursing note and vitals reviewed. Constitutional: She appears well-developed and well-nourished.  Pt lying in exam bed, holding left side of abdomen, appears uncomfortable.  HENT:  Head: Normocephalic and atraumatic.  Eyes: Conjunctivae are normal. No scleral icterus.  Neck: Normal range of motion.  Cardiovascular: Normal rate, regular rhythm and normal heart sounds.   Pulmonary/Chest: Effort normal and breath sounds normal. No respiratory distress. She has no wheezes. She has no rales. She exhibits no tenderness.  Abdominal: Soft. Bowel sounds are normal. She exhibits no distension and no mass. There is tenderness. There is no rebound and no guarding.  Soft, obese abdomen, tenderness in left side of abdomen, worst in LUQ.  Left sided CVAT  Genitourinary: Uterus  normal. Pelvic exam was performed with patient supine. No labial fusion. There is no rash, tenderness, lesion or injury on the right labia. There is no rash, tenderness, lesion or injury on the left labia. Cervix exhibits no motion tenderness, no discharge and no friability. Right adnexum displays no mass, no tenderness and no fullness. Left adnexum displays tenderness. Left adnexum displays no mass and no fullness. No erythema, tenderness or bleeding around the vagina. No foreign body around the vagina. No signs of injury  around the vagina.  Chaperoned exam: normal external exam.  Physiologic vaginal discharge. No bleeding, erythema, or masses. No CMT.  Significant for Left adnexal tenderness, no masses palpated.   Musculoskeletal: Normal range of motion.  Neurological: She is alert.  Skin: Skin is warm and dry.    ED Course  Procedures (including critical care time) Labs Review Labs Reviewed  WET PREP, GENITAL - Abnormal; Notable for the following:    WBC, Wet Prep HPF POC FEW (*)    All other components within normal limits  URINALYSIS, ROUTINE W REFLEX MICROSCOPIC - Abnormal; Notable for the following:    APPearance CLOUDY (*)    Leukocytes, UA LARGE (*)    All other components within normal limits  URINE MICROSCOPIC-ADD ON - Abnormal; Notable for the following:    Bacteria, UA FEW (*)    All other components within normal limits  GC/CHLAMYDIA PROBE AMP  COMPREHENSIVE METABOLIC PANEL  LIPASE, BLOOD  CBC WITH DIFFERENTIAL  HIV ANTIBODY (ROUTINE TESTING)  POC URINE PREG, ED   Imaging Review No results found.   EKG Interpretation None      MDM   Final diagnoses:  Chronic abdominal pain  Nausea & vomiting    Pt with hx of pancreatitis, pancreatic cysts, ovarian cysts and chronic abdominal pain, c/o gradually worsening left sided abdominal pain. On exam, pt appears uncomfortable. States she has not been able to take her Suboxone due to nausea and vomiting. No vomiting in ED.  Pt is afebrile.  Abd-obese, soft, tender in left side of abdomen.    Labs: CBC, Lipase, CMP: WNL Urine preg: negative UA: unremarkable.   Not concerned for surgical abdomen. Reviewed pt's previous medical records which include unremarkable abdominal CT in 08/2013.  Pt given IV dilaudid and phenergan which pt states did help with pain and nausea.  Do not believe further testing needed at this time. Advised pt to call to schedule a f/u appointment with [facility] for further evaluation and treatment of chronic  abdominal pain.  Rx: phenergan suppository. Advised to stay well hydrated. F/u with [facility]. Return precautions provided. Pt verbalized understanding and agreement with tx plan.     Junius Finner, PA-C 12/18/13 1348

## 2013-12-18 NOTE — ED Provider Notes (Signed)
Medical screening examination/treatment/procedure(s) were performed by non-physician practitioner and as supervising physician I was immediately available for consultation/collaboration.   EKG Interpretation None      Devoria AlbeIva Huyen Perazzo, MD, Armando GangFACEP   Ward GivensIva L Eryka Dolinger, MD 12/18/13 [phone]

## 2013-12-19 LAB — GC/CHLAMYDIA PROBE AMP
CT Probe RNA: NEGATIVE
GC Probe RNA: NEGATIVE

## 2014-02-23 ENCOUNTER — Emergency Department (HOSPITAL_BASED_OUTPATIENT_CLINIC_OR_DEPARTMENT_OTHER)
Admission: EM | Admit: 2014-02-23 | Discharge: 2014-02-24 | Disposition: A | Discharge status: Home / Self Care | Payer: Medicaid Other | Attending: Emergency Medicine | Admitting: Emergency Medicine

## 2014-02-23 ENCOUNTER — Encounter (HOSPITAL_BASED_OUTPATIENT_CLINIC_OR_DEPARTMENT_OTHER): Payer: Self-pay | Admitting: Emergency Medicine

## 2014-02-23 DIAGNOSIS — I10 Essential (primary) hypertension: Secondary | ICD-10-CM | POA: Insufficient documentation

## 2014-02-23 DIAGNOSIS — N390 Urinary tract infection, site not specified: Secondary | ICD-10-CM

## 2014-02-23 DIAGNOSIS — Z8742 Personal history of other diseases of the female genital tract: Secondary | ICD-10-CM | POA: Insufficient documentation

## 2014-02-23 DIAGNOSIS — Z79899 Other long term (current) drug therapy: Secondary | ICD-10-CM | POA: Insufficient documentation

## 2014-02-23 DIAGNOSIS — K859 Acute pancreatitis without necrosis or infection, unspecified: Secondary | ICD-10-CM | POA: Insufficient documentation

## 2014-02-23 LAB — URINALYSIS, ROUTINE W REFLEX MICROSCOPIC
Bilirubin Urine: NEGATIVE
GLUCOSE, UA: NEGATIVE mg/dL
Hgb urine dipstick: NEGATIVE
KETONES UR: NEGATIVE mg/dL
NITRITE: POSITIVE — AB
PROTEIN: NEGATIVE mg/dL
Specific Gravity, Urine: 1.02 (ref 1.005–1.030)
Urobilinogen, UA: 1 mg/dL (ref 0.0–1.0)
pH: 6.5 (ref 5.0–8.0)

## 2014-02-23 LAB — LIPASE, BLOOD: LIPASE: 33 U/L (ref 11–59)

## 2014-02-23 LAB — CBC WITH DIFFERENTIAL/PLATELET
BASOS ABS: 0 10*3/uL (ref 0.0–0.1)
BASOS PCT: 0 % (ref 0–1)
Eosinophils Absolute: 0.3 10*3/uL (ref 0.0–0.7)
Eosinophils Relative: 2 % (ref 0–5)
HEMATOCRIT: 37.1 % (ref 36.0–46.0)
Hemoglobin: 12.3 g/dL (ref 12.0–15.0)
Lymphocytes Relative: 22 % (ref 12–46)
Lymphs Abs: 3.4 10*3/uL (ref 0.7–4.0)
MCH: 29.6 pg (ref 26.0–34.0)
MCHC: 33.2 g/dL (ref 30.0–36.0)
MCV: 89.2 fL (ref 78.0–100.0)
MONO ABS: 1 10*3/uL (ref 0.1–1.0)
Monocytes Relative: 6 % (ref 3–12)
Neutro Abs: 10.7 10*3/uL — ABNORMAL HIGH (ref 1.7–7.7)
Neutrophils Relative %: 70 % (ref 43–77)
Platelets: 286 10*3/uL (ref 150–400)
RBC: 4.16 MIL/uL (ref 3.87–5.11)
RDW: 12.5 % (ref 11.5–15.5)
WBC: 15.3 10*3/uL — ABNORMAL HIGH (ref 4.0–10.5)

## 2014-02-23 LAB — COMPREHENSIVE METABOLIC PANEL
ALBUMIN: 3.8 g/dL (ref 3.5–5.2)
ALT: 17 U/L (ref 0–35)
AST: 17 U/L (ref 0–37)
Alkaline Phosphatase: 81 U/L (ref 39–117)
BILIRUBIN TOTAL: 0.2 mg/dL — AB (ref 0.3–1.2)
BUN: 14 mg/dL (ref 6–23)
CO2: 27 meq/L (ref 19–32)
CREATININE: 0.8 mg/dL (ref 0.50–1.10)
Calcium: 9 mg/dL (ref 8.4–10.5)
Chloride: 102 mEq/L (ref 96–112)
GFR calc Af Amer: >90 mL/min (ref >90)
GFR, EST NON AFRICAN AMERICAN: 89 mL/min — AB (ref >90)
Glucose, Bld: 101 mg/dL — ABNORMAL HIGH (ref 70–99)
Potassium: 3.5 mEq/L — ABNORMAL LOW (ref 3.7–5.3)
Sodium: 141 mEq/L (ref 137–147)
Total Protein: 7.8 g/dL (ref 6.0–8.3)

## 2014-02-23 LAB — URINE MICROSCOPIC-ADD ON

## 2014-02-23 MED ORDER — OXYCODONE-ACETAMINOPHEN 5-325 MG PO TABS: 1.0000 | ORAL_TABLET | Freq: Four times a day (QID) | ORAL | Status: DC | PRN

## 2014-02-23 MED ORDER — HYDROMORPHONE HCL PF 1 MG/ML IJ SOLN
1.0000 mg | Freq: Once | INTRAMUSCULAR | Status: AC
  Administered 2014-02-23: 1 mg via INTRAVENOUS
  Filled 2014-02-23: qty 1

## 2014-02-23 MED ORDER — SODIUM CHLORIDE 0.9 % IV BOLUS (SEPSIS)
1000.0000 mL | INTRAVENOUS | Status: AC
  Administered 2014-02-23: 1000 mL via INTRAVENOUS

## 2014-02-23 MED ORDER — PROMETHAZINE HCL 25 MG PO TABS: 25.0000 mg | ORAL_TABLET | Freq: Three times a day (TID) | ORAL | Status: DC | PRN

## 2014-02-23 MED ORDER — CEPHALEXIN 500 MG PO CAPS: 500.0000 mg | ORAL_CAPSULE | Freq: Four times a day (QID) | ORAL | Status: DC

## 2014-02-23 MED ORDER — CEFTRIAXONE SODIUM 1 G IJ SOLR
INTRAMUSCULAR | Status: AC
  Filled 2014-02-23: qty 10

## 2014-02-23 MED ORDER — CEFTRIAXONE SODIUM 1 G IJ SOLR
1.0000 g | Freq: Once | INTRAMUSCULAR | Status: AC
Start: 2014-02-23 — End: 2014-02-23
  Administered 2014-02-23: 1 g via INTRAVENOUS

## 2014-02-23 MED ORDER — PROMETHAZINE HCL 25 MG/ML IJ SOLN
12.5000 mg | Freq: Once | INTRAMUSCULAR | Status: AC
  Administered 2014-02-23: 12.5 mg via INTRAVENOUS
  Filled 2014-02-23: qty 1

## 2014-02-23 NOTE — ED Notes (Signed)
D/T third dose of pain medication, patient placed on continuous pulse oximetry monitoring.

## 2014-02-23 NOTE — ED Provider Notes (Signed)
CSN: [id number]     Arrival date & time 02/23/14  1950 History  This chart was scribed for Teresa Argyle, MD by Quintella Reichert, ED scribe.  This patient was seen in room MH10/MH10 and the patient's care was started at 9:44 PM.  Chief Complaint  Patient presents with  . Abdominal Pain     (Consider location/radiation/quality/duration/timing/severity/associated sxs/prior Treatment) Patient is a 43 y.o. female presenting with abdominal pain. The history is provided by the patient. No language interpreter was used.  Abdominal Pain Pain location:  Epigastric Pain quality: stabbing and throbbing   Pain radiates to:  Back Pain severity:  Severe Duration:  1 day Timing:  Constant Chronicity:  Recurrent Associated symptoms: dysuria, nausea and vomiting   Associated symptoms: no chest pain, no chills, no cough, no diarrhea, no fever, no shortness of breath and no sore throat    HPI Comments: Teresa Valenzuela is a 43 y.o. female with a history of pancreatitis who presents to the Emergency Department complaining of constant abdominal pain beginning yesterday. She states that the pain radiates to her back and feels like the pain she has previously experienced with pancreatitis. Patient reports associated nausea and vomiting. She states that she has taken Phenergan today with no relief. She also reports dysuria. She denies diarrhea or fever. She normally takes Suboxone but has not taken it today.  She denies drinking alcohol.   Past Medical History  Diagnosis Date  . Pancreatic cyst   . Pancreatitis   . Ovarian cyst   . Hypertension    Past Surgical History  Procedure Laterality Date  . Cholecystectomy    . Tubal ligation     History reviewed. No pertinent family history. History  Substance Use Topics  . Smoking status: Never Smoker   . Smokeless tobacco: Never Used  . Alcohol Use: No   OB History   Grav Para Term Preterm Abortions TAB SAB Ect Mult Living                  Review of Systems  Constitutional: Negative for fever and chills.  HENT: Negative for congestion, rhinorrhea and sore throat.   Eyes: Negative for visual disturbance.  Respiratory: Negative for cough and shortness of breath.   Cardiovascular: Negative for chest pain and leg swelling.  Gastrointestinal: Positive for nausea, vomiting and abdominal pain. Negative for diarrhea.  Genitourinary: Positive for dysuria.  Musculoskeletal: Negative for neck pain.  Skin: Negative for rash.  Neurological: Negative for headaches.  Hematological: Does not bruise/bleed easily.  Psychiatric/Behavioral: Negative for confusion.      Allergies  Zofran and Reglan  Home Medications   Prior to Admission medications   Medication Sig Start Date End Date Taking? Authorizing Provider  Buprenorphine HCl-Naloxone HCl (SUBOXONE SL) Place 1 tablet under the tongue daily.     Historical Provider, MD  promethazine (PHENERGAN) 25 MG suppository Place 1 suppository (25 mg total) rectally every 6 (six) hours as needed for nausea or vomiting. 12/18/13   Teresa Finner, PA-C   BP 162/81  Pulse 92  Temp(Src) 98.2 F (36.8 C) (Oral)  Resp 20  SpO2 99%  LMP 02/08/2014 Physical Exam  Nursing note and vitals reviewed. Constitutional: She is oriented to person, place, and time. She appears well-developed and well-nourished. No distress.  HENT:  Head: Normocephalic and atraumatic.  Mouth/Throat: Oropharynx is clear and moist.  Eyes: Conjunctivae and EOM are normal. Pupils are equal, round, and reactive to light.  Neck: Normal range of  motion. Neck supple. No tracheal deviation present.  Cardiovascular: Normal rate, regular rhythm and normal heart sounds.  Exam reveals no gallop and no friction rub.   No murmur heard. Pulmonary/Chest: Effort normal and breath sounds normal. No respiratory distress. She has no wheezes. She has no rales.  Abdominal: Soft. Bowel sounds are normal. She exhibits no distension and no  mass. There is tenderness. There is no rebound and no guarding.  Moderate tenderness to palpation in the epigastric region.   Musculoskeletal: Normal range of motion.  Neurological: She is alert and oriented to person, place, and time.  Skin: Skin is warm and dry.  Psychiatric: She has a normal mood and affect. Her behavior is normal.    ED Course  Procedures (including critical care time) DIAGNOSTIC STUDIES: Oxygen Saturation is 99% on room air, normal by my interpretation.    COORDINATION OF CARE: 9:49 PM-Discussed treatment plan which includes IV fluids, Dilaudid, Rocephin, Phenergan, and labs with pt at bedside and pt agreed to plan.     Labs Review Labs Reviewed  CBC WITH DIFFERENTIAL - Abnormal; Notable for the following:    WBC 15.3 (*)    Neutro Abs 10.7 (*)    All other components within normal limits  COMPREHENSIVE METABOLIC PANEL - Abnormal; Notable for the following:    Potassium 3.5 (*)    Glucose, Bld 101 (*)    Total Bilirubin 0.2 (*)    GFR calc non Af Amer 89 (*)    All other components within normal limits  URINALYSIS, ROUTINE W REFLEX MICROSCOPIC - Abnormal; Notable for the following:    Color, Urine ORANGE (*)    APPearance CLOUDY (*)    Nitrite POSITIVE (*)    Leukocytes, UA SMALL (*)    All other components within normal limits  URINE MICROSCOPIC-ADD ON - Abnormal; Notable for the following:    Squamous Epithelial / LPF FEW (*)    Bacteria, UA MANY (*)    All other components within normal limits  LIPASE, BLOOD    Imaging Review No results found.   EKG Interpretation None      MDM   Final diagnoses:  Pancreatitis  UTI (lower urinary tract infection)    10:41 PM 43 y.o. female here w/ epig pain c/w pancreatitis. VSS. Will get labs, sx control.   Mildly elev wbc, found to have UTI which I gave her a dose of IV rocephin here. Labs otherwise non-contrib. Pain controlled and pt tolerating po. She would like to go home. Will have her hold  suboxone until pain controlled at home. I have discussed the diagnosis/risks/treatment options with the patient and believe the pt to be eligible for discharge home to follow-up with pcp next week. We also discussed returning to the ED immediately if new or worsening sx occur. We discussed the sx which are most concerning (e.g., worsening pain, inc vomiting, fever) that necessitate immediate return. Medications administered to the patient during their visit and any new prescriptions provided to the patient are listed below.  Medications given during this visit Medications  sodium chloride 0.9 % bolus 1,000 mL (0 mLs Intravenous Stopped 02/23/14 2235)  HYDROmorphone (DILAUDID) injection 1 mg (1 mg Intravenous Given 02/23/14 2158)  cefTRIAXone (ROCEPHIN) 1 g in dextrose 5 % 50 mL IVPB (0 g Intravenous Stopped 02/23/14 2235)  promethazine (PHENERGAN) injection 12.5 mg (12.5 mg Intravenous Given 02/23/14 2157)  HYDROmorphone (DILAUDID) injection 1 mg (1 mg Intravenous Given 02/23/14 2235)  HYDROmorphone (DILAUDID) injection 1  mg (1 mg Intravenous Given 02/23/14 2317)    Discharge Medication List as of 02/23/2014 11:48 PM    START taking these medications   Details  cephALEXin (KEFLEX) 500 MG capsule Take 1 capsule (500 mg total) by mouth 4 (four) times daily., Starting 02/23/2014, Until Discontinued, Print    oxyCODONE-acetaminophen (PERCOCET) 5-325 MG per tablet Take 1-2 tablets by mouth every 6 (six) hours as needed., Starting 02/23/2014, Until Discontinued, Print    promethazine (PHENERGAN) 25 MG tablet Take 1 tablet (25 mg total) by mouth every 8 (eight) hours as needed for nausea or vomiting., Starting 02/23/2014, Until Discontinued, Print          I personally performed the services described in this documentation, which was scribed in my presence. The recorded information has been reviewed and is accurate.    Teresa ArgyleForrest S Briahna Pescador, MD 02/24/14 1534

## 2014-02-23 NOTE — Discharge Instructions (Signed)
Acute Pancreatitis °Acute pancreatitis is a disease in which the pancreas becomes suddenly inflamed. The pancreas is a large gland located behind your stomach. The pancreas produces enzymes that help digest food. The pancreas also releases the hormones glucagon and insulin that help regulate blood sugar. Damage to the pancreas occurs when the digestive enzymes from the pancreas are activated and begin attacking the pancreas before being released into the intestine. Most acute attacks last a couple of days and can cause serious complications. Some people become dehydrated and develop low blood pressure. In severe cases, bleeding into the pancreas can lead to shock and can be life-threatening. The lungs, heart, and kidneys may fail. °CAUSES  °Pancreatitis can happen to anyone. In some cases, the cause is unknown. Most cases are caused by: °· Alcohol abuse. °· Gallstones. °Other less common causes are: °· Certain medicines. °· Exposure to certain chemicals. °· Infection. °· Damage caused by an accident (trauma). °· Abdominal surgery. °SYMPTOMS  °· Pain in the upper abdomen that may radiate to the back. °· Tenderness and swelling of the abdomen. °· Nausea and vomiting. °DIAGNOSIS  °Your caregiver will perform a physical exam. Blood and stool tests may be done to confirm the diagnosis. Imaging tests may also be done, such as X-rays, CT scans, or an ultrasound of the abdomen. °TREATMENT  °Treatment usually requires a stay in the hospital. Treatment may include: °· Pain medicine. °· Fluid replacement through an intravenous line (IV). °· Placing a tube in the stomach to remove stomach contents and control vomiting. °· Not eating for 3 or 4 days. This gives your pancreas a rest, because enzymes are not being produced that can cause further damage. °· Antibiotic medicines if your condition is caused by an infection. °· Surgery of the pancreas or gallbladder. °HOME CARE INSTRUCTIONS  °· Follow the diet advised by your  caregiver. This may involve avoiding alcohol and decreasing the amount of fat in your diet. °· Eat smaller, more frequent meals. This reduces the amount of digestive juices the pancreas produces. °· Drink enough fluids to keep your urine clear or pale yellow. °· Only take over-the-counter or prescription medicines as directed by your caregiver. °· Avoid drinking alcohol if it caused your condition. °· Do not smoke. °· Get plenty of rest. °· Check your blood sugar at home as directed by your caregiver. °· Keep all follow-up appointments as directed by your caregiver. °SEEK MEDICAL CARE IF:  °· You do not recover as quickly as expected. °· You develop new or worsening symptoms. °· You have persistent pain, weakness, or nausea. °· You recover and then have another episode of pain. °SEEK IMMEDIATE MEDICAL CARE IF:  °· You are unable to eat or keep fluids down. °· Your pain becomes severe. °· You have a fever or persistent symptoms for more than 2 to 3 days. °· You have a fever and your symptoms suddenly get worse. °· Your skin or the white part of your eyes turn yellow (jaundice). °· You develop vomiting. °· You feel dizzy, or you faint. °· Your blood sugar is high (over 300 mg/dL). °MAKE SURE YOU:  °· Understand these instructions. °· Will watch your condition. °· Will get help right away if you are not doing well or get worse. °Document Released: 08/29/2005 Document Revised: 02/28/2012 Document Reviewed: 12/08/2011 °[employer]® Patient Information ©2014 [employer]. ° °

## 2014-02-23 NOTE — ED Notes (Signed)
Pt reports onset abd pain x1 day hx pancreatitis pain is same

## 2014-06-24 ENCOUNTER — Observation Stay (HOSPITAL_BASED_OUTPATIENT_CLINIC_OR_DEPARTMENT_OTHER)
Admission: EM | Admit: 2014-06-24 | Discharge: 2014-06-26 | Disposition: A | Discharge status: Home / Self Care | Payer: Self-pay | Attending: Internal Medicine | Admitting: Internal Medicine

## 2014-06-24 ENCOUNTER — Encounter (HOSPITAL_BASED_OUTPATIENT_CLINIC_OR_DEPARTMENT_OTHER): Payer: Self-pay | Admitting: Emergency Medicine

## 2014-06-24 ENCOUNTER — Emergency Department (HOSPITAL_BASED_OUTPATIENT_CLINIC_OR_DEPARTMENT_OTHER): Payer: Self-pay

## 2014-06-24 DIAGNOSIS — E876 Hypokalemia: Secondary | ICD-10-CM

## 2014-06-24 DIAGNOSIS — R079 Chest pain, unspecified: Principal | ICD-10-CM

## 2014-06-24 DIAGNOSIS — R0602 Shortness of breath: Secondary | ICD-10-CM

## 2014-06-24 DIAGNOSIS — I10 Essential (primary) hypertension: Secondary | ICD-10-CM

## 2014-06-24 DIAGNOSIS — R109 Unspecified abdominal pain: Secondary | ICD-10-CM

## 2014-06-24 LAB — CBC WITH DIFFERENTIAL/PLATELET
Basophils Absolute: 0 10*3/uL (ref 0.0–0.1)
Basophils Relative: 0 % (ref 0–1)
EOS PCT: 1 % (ref 0–5)
Eosinophils Absolute: 0.2 10*3/uL (ref 0.0–0.7)
HCT: 38 % (ref 36.0–46.0)
Hemoglobin: 12.7 g/dL (ref 12.0–15.0)
LYMPHS ABS: 3 10*3/uL (ref 0.7–4.0)
Lymphocytes Relative: 26 % (ref 12–46)
MCH: 29.1 pg (ref 26.0–34.0)
MCHC: 33.4 g/dL (ref 30.0–36.0)
MCV: 87.2 fL (ref 78.0–100.0)
Monocytes Absolute: 0.9 10*3/uL (ref 0.1–1.0)
Monocytes Relative: 8 % (ref 3–12)
NEUTROS PCT: 65 % (ref 43–77)
Neutro Abs: 7.6 10*3/uL (ref 1.7–7.7)
Platelets: 267 10*3/uL (ref 150–400)
RBC: 4.36 MIL/uL (ref 3.87–5.11)
RDW: 12.3 % (ref 11.5–15.5)
WBC: 11.7 10*3/uL — AB (ref 4.0–10.5)

## 2014-06-24 LAB — COMPREHENSIVE METABOLIC PANEL
ALK PHOS: 79 U/L (ref 39–117)
ALT: 11 U/L (ref 0–35)
AST: 18 U/L (ref 0–37)
Albumin: 3.7 g/dL (ref 3.5–5.2)
Anion gap: 13 (ref 5–15)
BUN: 14 mg/dL (ref 6–23)
CHLORIDE: 101 meq/L (ref 96–112)
CO2: 26 meq/L (ref 19–32)
Calcium: 9.1 mg/dL (ref 8.4–10.5)
Creatinine, Ser: 0.8 mg/dL (ref 0.50–1.10)
GFR calc Af Amer: >90 mL/min (ref >90)
GFR, EST NON AFRICAN AMERICAN: 88 mL/min — AB (ref >90)
GLUCOSE: 110 mg/dL — AB (ref 70–99)
POTASSIUM: 3.3 meq/L — AB (ref 3.7–5.3)
SODIUM: 140 meq/L (ref 137–147)
Total Bilirubin: 0.3 mg/dL (ref 0.3–1.2)
Total Protein: 7.6 g/dL (ref 6.0–8.3)

## 2014-06-24 LAB — TROPONIN I: Troponin I: <0.3 ng/mL (ref <0.30)

## 2014-06-24 MED ORDER — ASPIRIN 81 MG PO CHEW
324.0000 mg | CHEWABLE_TABLET | Freq: Once | ORAL | Status: AC
  Administered 2014-06-24: 324 mg via ORAL
  Filled 2014-06-24: qty 4

## 2014-06-24 MED ORDER — NITROGLYCERIN 0.4 MG SL SUBL
0.4000 mg | SUBLINGUAL_TABLET | SUBLINGUAL | Status: DC | PRN
  Administered 2014-06-24 – 2014-06-25 (×2): 0.4 mg via SUBLINGUAL
  Filled 2014-06-24 (×2): qty 1

## 2014-06-24 NOTE — ED Notes (Signed)
Chest pain x 2 days. Dull sometimes sharp. Jaw pain.

## 2014-06-24 NOTE — ED Provider Notes (Signed)
CSN: [id number]     Arrival date & time 06/24/14  2152 History  This chart was scribed for Teresa Valenzuela Teresa Dilday, MD by Bronson CurbJacqueline Melvin, ED Scribe. This patient was seen in room MH04/MH04 and the patient's care was started at 11:24 PM.    Chief Complaint  Patient presents with  . Chest Pain     The history is provided by the patient. No language interpreter was used.    HPI Comments: Teresa Valenzuela Thresher is a 43 y.o. female who presents to the Emergency Department complaining of SOB onset 3 days ago. There is associated dull, but somestimes sharp, pain in the left upper chest, diaphoresis (with exertion), and pain to the left arm. Patient states her symptoms are worsened with exertion and deep breathing and better with rest. She rates her pain a 6/10 at the present time. She is a nonsmoker and no personal or family history of CAD.   Past Medical History  Diagnosis Date  . Pancreatic cyst   . Pancreatitis   . Ovarian cyst   . Hypertension    Past Surgical History  Procedure Laterality Date  . Cholecystectomy    . Tubal ligation     No family history on file. History  Substance Use Topics  . Smoking status: Never Smoker   . Smokeless tobacco: Never Used  . Alcohol Use: No   OB History   Grav Para Term Preterm Abortions TAB SAB Ect Mult Living                 Review of Systems A complete 10 system review of systems was obtained and all systems are negative except as noted in the HPI and PMH.     Allergies  Zofran and Reglan  Home Medications   Prior to Admission medications   Medication Sig Start Date End Date Taking? Authorizing Provider  HYDROCHLOROTHIAZIDE PO Take by mouth.   Yes Historical Provider, MD  Buprenorphine HCl-Naloxone HCl (SUBOXONE SL) Place 1 tablet under the tongue daily.     Historical Provider, MD  cephALEXin (KEFLEX) 500 MG capsule Take 1 capsule (500 mg total) by mouth 4 (four) times daily. 02/23/14   Purvis SheffieldForrest Harrison, MD  oxyCODONE-acetaminophen  (PERCOCET) 5-325 MG per tablet Take 1-2 tablets by mouth every 6 (six) hours as needed. 02/23/14   Purvis SheffieldForrest Harrison, MD  promethazine (PHENERGAN) 25 MG suppository Place 1 suppository (25 mg total) rectally every 6 (six) hours as needed for nausea or vomiting. 12/18/13   Junius FinnerErin O'Malley, PA-C  promethazine (PHENERGAN) 25 MG tablet Take 1 tablet (25 mg total) by mouth every 8 (eight) hours as needed for nausea or vomiting. 02/23/14   Purvis SheffieldForrest Harrison, MD   Triage Vitals: BP 144/106  Pulse 87  Temp(Src) 98 F (36.7 C) (Oral)  Resp 16  Ht 5\' 7"  (1.702 m)  Wt 250 lb (113.399 kg)  BMI 39.15 kg/m2  SpO2 100%  LMP 06/02/2014  Physical Exam General: Well-developed, well-nourished female in no acute distress; appearance consistent with age of record HENT: normocephalic; atraumatic Eyes: pupils equal, round and reactive to light; extraocular muscles intact Neck: supple Heart: regular rate and rhythm; no murmurs, rubs or gallops Lungs: clear to auscultation bilaterally Chest: Left upper chest wall tenderness which does not reproduce the pain of chief complaint Abdomen: soft; nondistended; no masses or hepatosplenomegaly; bowel sounds present; mild epigastric tenderness Extremities: No deformity; full range of motion; pulses normal Neurologic: Awake, alert and oriented; motor function intact in all extremities and symmetric;  no facial droop Skin: Warm and dry Psychiatric: Normal mood and affect  ED Course  Procedures (including critical care time)  DIAGNOSTIC STUDIES: Oxygen Saturation is 100% on room air, normal by my interpretation.    COORDINATION OF CARE: At 2330 Discussed treatment plan with patient which includes labs. Patient agrees.     EKG Interpretation   Date/Time:  Tuesday June 24 2014 22:00:03 EDT Ventricular Rate:  94 PR Interval:  142 QRS Duration: 90 QT Interval:  368 QTC Calculation: 460 R Axis:   56 Text Interpretation:  Normal sinus rhythm Cannot rule out Anterior  infarct  , age undetermined Abnormal ECG No significant change was found Confirmed  by Read Drivers  MD, Jonny Ruiz ([id number]) on 06/24/2014 10:53:56 PM      MDM   Nursing notes and vitals signs, including pulse oximetry, reviewed.  Summary of this visit's results, reviewed by myself:  Labs:  Results for orders placed during the hospital encounter of 06/24/14 (from the past 24 hour(s))  CBC WITH DIFFERENTIAL     Status: Abnormal   Collection Time    06/24/14 10:20 PM      Result Value Ref Range   WBC 11.7 (*) 4.0 - 10.5 K/uL   RBC 4.36  3.87 - 5.11 MIL/uL   Hemoglobin 12.7  12.0 - 15.0 g/dL   HCT 60.4  54.0 - 98.1 %   MCV 87.2  78.0 - 100.0 fL   MCH 29.1  26.0 - 34.0 pg   MCHC 33.4  30.0 - 36.0 g/dL   RDW 19.1  47.8 - 29.5 %   Platelets 267  150 - 400 K/uL   Neutrophils Relative % 65  43 - 77 %   Neutro Abs 7.6  1.7 - 7.7 K/uL   Lymphocytes Relative 26  12 - 46 %   Lymphs Abs 3.0  0.7 - 4.0 K/uL   Monocytes Relative 8  3 - 12 %   Monocytes Absolute 0.9  0.1 - 1.0 K/uL   Eosinophils Relative 1  0 - 5 %   Eosinophils Absolute 0.2  0.0 - 0.7 K/uL   Basophils Relative 0  0 - 1 %   Basophils Absolute 0.0  0.0 - 0.1 K/uL  COMPREHENSIVE METABOLIC PANEL     Status: Abnormal   Collection Time    06/24/14 10:20 PM      Result Value Ref Range   Sodium 140  137 - 147 mEq/Valenzuela   Potassium 3.3 (*) 3.7 - 5.3 mEq/Valenzuela   Chloride 101  96 - 112 mEq/Valenzuela   CO2 26  19 - 32 mEq/Valenzuela   Glucose, Bld 110 (*) 70 - 99 mg/dL   BUN 14  6 - 23 mg/dL   Creatinine, Ser 6.21  0.50 - 1.10 mg/dL   Calcium 9.1  8.4 - 30.8 mg/dL   Total Protein 7.6  6.0 - 8.3 g/dL   Albumin 3.7  3.5 - 5.2 g/dL   AST 18  0 - 37 U/Valenzuela   ALT 11  0 - 35 U/Valenzuela   Alkaline Phosphatase 79  39 - 117 U/Valenzuela   Total Bilirubin 0.3  0.3 - 1.2 mg/dL   GFR calc non Af Amer 88 (*) >90 mL/min   GFR calc Af Amer >90  >90 mL/min   Anion gap 13  5 - 15  TROPONIN I     Status: None   Collection Time    06/24/14 10:20 PM      Result Value Ref Range  Troponin I  <0.30  <0.30 ng/mL    Imaging Studies: Dg Chest 2 View  06/24/2014   CLINICAL DATA:  Chest pain for 2 days. CHEST PAIN LEFT upper extremity pain.  EXAM: CHEST  2 VIEW  COMPARISON:  10/09/2013.  FINDINGS: Cardiopericardial silhouette within normal limits. Mediastinal contours normal. Trachea midline. No airspace disease or effusion.  IMPRESSION: No active cardiopulmonary disease.   Electronically Signed   By: Andreas NewportGeoffrey  Lamke M.D.   On: 06/24/2014 22:52   12:40 AM Pain-free after sublingual nitroglycerin.  I personally performed the services described in this documentation, which was scribed in my presence. The recorded information has been reviewed and is accurate.   Teresa Valenzuela Leotha Westermeyer, MD 06/25/14 [phone]

## 2014-06-25 ENCOUNTER — Encounter (HOSPITAL_COMMUNITY): Payer: Self-pay | Admitting: General Practice

## 2014-06-25 ENCOUNTER — Observation Stay (HOSPITAL_COMMUNITY): Payer: Self-pay

## 2014-06-25 ENCOUNTER — Observation Stay (HOSPITAL_COMMUNITY): Payer: Medicaid Other

## 2014-06-25 DIAGNOSIS — E876 Hypokalemia: Secondary | ICD-10-CM

## 2014-06-25 DIAGNOSIS — R109 Unspecified abdominal pain: Secondary | ICD-10-CM

## 2014-06-25 DIAGNOSIS — I10 Essential (primary) hypertension: Secondary | ICD-10-CM

## 2014-06-25 DIAGNOSIS — R072 Precordial pain: Secondary | ICD-10-CM

## 2014-06-25 DIAGNOSIS — R079 Chest pain, unspecified: Secondary | ICD-10-CM | POA: Diagnosis present

## 2014-06-25 LAB — COMPREHENSIVE METABOLIC PANEL
ALBUMIN: 3.4 g/dL — AB (ref 3.5–5.2)
ALK PHOS: 66 U/L (ref 39–117)
ALT: 14 U/L (ref 0–35)
AST: 14 U/L (ref 0–37)
Anion gap: 14 (ref 5–15)
BILIRUBIN TOTAL: 0.4 mg/dL (ref 0.3–1.2)
BUN: 13 mg/dL (ref 6–23)
CO2: 24 meq/L (ref 19–32)
Calcium: 8.7 mg/dL (ref 8.4–10.5)
Chloride: 103 mEq/L (ref 96–112)
Creatinine, Ser: 0.66 mg/dL (ref 0.50–1.10)
GFR calc Af Amer: >90 mL/min (ref >90)
Glucose, Bld: 97 mg/dL (ref 70–99)
POTASSIUM: 3.3 meq/L — AB (ref 3.7–5.3)
Sodium: 141 mEq/L (ref 137–147)
Total Protein: 7.3 g/dL (ref 6.0–8.3)

## 2014-06-25 LAB — CBC WITH DIFFERENTIAL/PLATELET
BASOS PCT: 1 % (ref 0–1)
Basophils Absolute: 0 10*3/uL (ref 0.0–0.1)
EOS ABS: 0.1 10*3/uL (ref 0.0–0.7)
EOS PCT: 2 % (ref 0–5)
HEMATOCRIT: 36.7 % (ref 36.0–46.0)
HEMOGLOBIN: 12.1 g/dL (ref 12.0–15.0)
Lymphocytes Relative: 28 % (ref 12–46)
Lymphs Abs: 2.3 10*3/uL (ref 0.7–4.0)
MCH: 28.9 pg (ref 26.0–34.0)
MCHC: 33 g/dL (ref 30.0–36.0)
MCV: 87.6 fL (ref 78.0–100.0)
MONO ABS: 0.6 10*3/uL (ref 0.1–1.0)
MONOS PCT: 7 % (ref 3–12)
NEUTROS ABS: 5.1 10*3/uL (ref 1.7–7.7)
Neutrophils Relative %: 62 % (ref 43–77)
Platelets: 235 10*3/uL (ref 150–400)
RBC: 4.19 MIL/uL (ref 3.87–5.11)
RDW: 12.4 % (ref 11.5–15.5)
WBC: 8.2 10*3/uL (ref 4.0–10.5)

## 2014-06-25 LAB — D-DIMER, QUANTITATIVE: D-Dimer, Quant: 0.48 ug/mL-FEU (ref 0.00–0.48)

## 2014-06-25 LAB — TROPONIN I
Troponin I: <0.3 ng/mL (ref <0.30)
Troponin I: <0.3 ng/mL (ref <0.30)

## 2014-06-25 LAB — LIPASE, BLOOD: LIPASE: 49 U/L (ref 11–59)

## 2014-06-25 LAB — TSH: TSH: 1.3 u[IU]/mL (ref 0.350–4.500)

## 2014-06-25 MED ORDER — ASPIRIN EC 325 MG PO TBEC
325.0000 mg | DELAYED_RELEASE_TABLET | Freq: Every day | ORAL | Status: DC
  Administered 2014-06-25 – 2014-06-26 (×2): 325 mg via ORAL
  Filled 2014-06-25 (×2): qty 1

## 2014-06-25 MED ORDER — PROMETHAZINE HCL 25 MG/ML IJ SOLN
12.5000 mg | Freq: Once | INTRAMUSCULAR | Status: AC
  Administered 2014-06-25: 12.5 mg via INTRAVENOUS
  Filled 2014-06-25: qty 1

## 2014-06-25 MED ORDER — ACETAMINOPHEN 650 MG RE SUPP: 650.0000 mg | Freq: Four times a day (QID) | RECTAL | Status: DC | PRN

## 2014-06-25 MED ORDER — ACETAMINOPHEN 325 MG PO TABS
650.0000 mg | ORAL_TABLET | Freq: Four times a day (QID) | ORAL | Status: DC | PRN
  Administered 2014-06-25: 650 mg via ORAL
  Filled 2014-06-25: qty 2

## 2014-06-25 MED ORDER — SODIUM CHLORIDE 0.9 % IJ SOLN: 3.0000 mL | Freq: Two times a day (BID) | INTRAMUSCULAR | Status: DC

## 2014-06-25 MED ORDER — ENOXAPARIN SODIUM 40 MG/0.4ML ~~LOC~~ SOLN
40.0000 mg | SUBCUTANEOUS | Status: DC
  Administered 2014-06-25 – 2014-06-26 (×2): 40 mg via SUBCUTANEOUS
  Filled 2014-06-25 (×2): qty 0.4

## 2014-06-25 MED ORDER — PROMETHAZINE HCL 25 MG/ML IJ SOLN
12.5000 mg | Freq: Four times a day (QID) | INTRAMUSCULAR | Status: DC | PRN
  Administered 2014-06-25 – 2014-06-26 (×3): 12.5 mg via INTRAVENOUS
  Filled 2014-06-25 (×3): qty 1

## 2014-06-25 MED ORDER — HYDROCHLOROTHIAZIDE 25 MG PO TABS
25.0000 mg | ORAL_TABLET | Freq: Every day | ORAL | Status: DC
  Administered 2014-06-25 – 2014-06-26 (×2): 25 mg via ORAL
  Filled 2014-06-25 (×2): qty 1

## 2014-06-25 MED ORDER — POTASSIUM CHLORIDE CRYS ER 20 MEQ PO TBCR
40.0000 meq | EXTENDED_RELEASE_TABLET | Freq: Once | ORAL | Status: AC
  Administered 2014-06-25: 40 meq via ORAL
  Filled 2014-06-25: qty 2

## 2014-06-25 MED ORDER — POTASSIUM CHLORIDE ER 20 MEQ PO TBCR: 10.0000 meq | EXTENDED_RELEASE_TABLET | Freq: Every day | ORAL | Status: DC

## 2014-06-25 NOTE — Progress Notes (Signed)
UR completed 

## 2014-06-25 NOTE — H&P (Signed)
[facility] History and Physical  Teresa Valenzuela ZOX:[id number] DOB: 28-Feb-1971 DOA: 06/24/2014  Referring physician: ER physician. [facility] PCP: No PCP Per Patient   Chief Complaint: Chest pain.  HPI: Teresa Valenzuela is a 43 y.o. female with history of chronic pancreatitis and hypertension started experiencing chest pain over the last 3 days. Patient's chest pain is retrosternal pressure-like radiating to the back and shoulders and increases on exertion with mild shortness of breath. Has been happening off and on for last 3 days last for half an hour to one hour and gets relieved by rest. In the ER cardiac markers chest x-ray and EKG were unremarkable and patient has been improved with nitroglycerin sublingual. Patient is chest pain-free at this time and admitted for further management. On exam patient does have right upper quadrant tenderness which patient states has been chronic. Has mild nausea denies any vomiting or diarrhea.   Review of Systems: As presented in the history of presenting illness, rest negative.  Past Medical History  Diagnosis Date  . Pancreatic cyst   . Pancreatitis   . Ovarian cyst   . Hypertension    Past Surgical History  Procedure Laterality Date  . Cholecystectomy    . Tubal ligation     Social History:  reports that she has never smoked. She has never used smokeless tobacco. She reports that she uses illicit drugs (Marijuana). She reports that she does not drink alcohol. Where does patient live home. Can patient participate in ADLs? Yes.  Allergies  Allergen Reactions  . Zofran Other (See Comments)    headache  . Reglan [Metoclopramide] Palpitations    Family History:  Family History  Problem Relation Age of Onset  . CAD Neg Hx       Prior to Admission medications   Medication Sig Start Date End Date Taking? Authorizing Provider  aspirin 81 MG tablet Take 81 mg by mouth daily.   Yes  Historical Provider, MD  HYDROCHLOROTHIAZIDE PO Take by mouth.   Yes Historical Provider, MD  Buprenorphine HCl-Naloxone HCl (SUBOXONE SL) Place 1 tablet under the tongue daily.     Historical Provider, MD  cephALEXin (KEFLEX) 500 MG capsule Take 1 capsule (500 mg total) by mouth 4 (four) times daily. 02/23/14   Purvis SheffieldForrest Harrison, MD  oxyCODONE-acetaminophen (PERCOCET) 5-325 MG per tablet Take 1-2 tablets by mouth every 6 (six) hours as needed. 02/23/14   Purvis SheffieldForrest Harrison, MD  promethazine (PHENERGAN) 25 MG suppository Place 1 suppository (25 mg total) rectally every 6 (six) hours as needed for nausea or vomiting. 12/18/13   Junius FinnerErin O'Malley, PA-C  promethazine (PHENERGAN) 25 MG tablet Take 1 tablet (25 mg total) by mouth every 8 (eight) hours as needed for nausea or vomiting. 02/23/14   Purvis SheffieldForrest Harrison, MD    Physical Exam: Filed Vitals:   06/24/14 2156 06/24/14 2341 06/25/14 0040 06/25/14 0240  BP: 144/106 126/76 145/69 152/90  Pulse: 87 74 74 72  Temp: 98 F (36.7 C)  98.3 F (36.8 C) 98.3 F (36.8 C)  TempSrc: Oral  Oral Oral  Resp: 16 18 18 18   Height: 5\' 7"  (1.702 m)   5\' 7"  (1.702 m)  Weight: 113.399 kg (250 lb)   120.249 kg (265 lb 1.6 oz)  SpO2: 100% 99%  99%     General:  Well-developed and nourished.  Eyes: Anicteric no pallor.  ENT: No discharge from the ears eyes nose mouth.   Neck: No mass felt.  Cardiovascular: S1-S2 heard.  Respiratory: No rhonchi or crepitations.  Abdomen: Mild right upper quadrant tenderness no guarding or rigidity.  Skin: No rash.  Musculoskeletal: No edema.  Psychiatric: Appears normal.  Neurologic: Alert awake oriented to time place and person. Moves all extremities.  Labs on Admission:  Basic Metabolic Panel:  Recent Labs Lab 06/24/14 2220  NA 140  K 3.3*  CL 101  CO2 26  GLUCOSE 110*  BUN 14  CREATININE 0.80  CALCIUM 9.1   Liver Function Tests:  Recent Labs Lab 06/24/14 2220  AST 18  ALT 11  ALKPHOS 79  BILITOT 0.3   PROT 7.6  ALBUMIN 3.7   No results found for this basename: LIPASE, AMYLASE,  in the last 168 hours No results found for this basename: AMMONIA,  in the last 168 hours CBC:  Recent Labs Lab 06/24/14 2220  WBC 11.7*  NEUTROABS 7.6  HGB 12.7  HCT 38.0  MCV 87.2  PLT 267   Cardiac Enzymes:  Recent Labs Lab 06/24/14 2220  TROPONINI <0.30    BNP (last 3 results)  Recent Labs  10/09/13 1245  PROBNP 25.7   CBG: No results found for this basename: GLUCAP,  in the last 168 hours  Radiological Exams on Admission: Dg Chest 2 View  06/24/2014   CLINICAL DATA:  Chest pain for 2 days. CHEST PAIN LEFT upper extremity pain.  EXAM: CHEST  2 VIEW  COMPARISON:  10/09/2013.  FINDINGS: Cardiopericardial silhouette within normal limits. Mediastinal contours normal. Trachea midline. No airspace disease or effusion.  IMPRESSION: No active cardiopulmonary disease.   Electronically Signed   By: Andreas NewportGeoffrey  Lamke M.D.   On: 06/24/2014 22:52    EKG: Independently reviewed. Normal sinus rhythm.  Assessment/Plan Principal Problem:   Chest pain Active Problems:   Essential hypertension, benign   1. Chest pain -  given that the patient's chest pain improved with sublingual nitroglycerin we will cycle her markers check 2-D echo and d-dimer. Check lipase. Aspirin. Keep patient n.p.o. in anticipation of possible cardiac procedures. 2. Hypertension - continue HCTZ. 3. History of chronic pancreatitis with right upper quadrant tenderness - patient has right upper quadrant tenderness on exam. We'll check sonogram of the abdomen and also lipase.     Code Status: Full code.  Family Communication: None.  Disposition Plan: Admit for observation.    Luvada Salamone N. [facility] Pager [phone].  If 7PM-7AM, please contact night-coverage [email or web address] Password [email or web address] 06/25/2014, 3:55 AM

## 2014-06-25 NOTE — Progress Notes (Signed)
CSW (Clinical Child psychotherapistocial Worker) aware of consult. Consult more appropriate for financial counselor and RNCM. CSW notified both. Please reconsult should new CSW needs arise.  Walter Min, LCSWA [phone]

## 2014-06-25 NOTE — Progress Notes (Signed)
     The patient was seen in nuclear medicine for a GXT stress ECHO. She tolerated the procedure well. No acute ST or TW changes on ECG. She walked 6 min 38 sec on bruce protocol and exceeded target HR of 149 and reached 166. Await ECHO images.   Thereasa ParkinKathryn Stern PA-C  [facility]

## 2014-06-25 NOTE — Care Management Note (Signed)
    Page 1 of 1   06/25/2014     11:44:01 AM CARE MANAGEMENT NOTE 06/25/2014  Patient:  Teresa Valenzuela,Teresa Valenzuela   Account Number:  [id number]  Date Initiated:  06/25/2014  Documentation initiated by:  GRAVES-BIGELOW,Mazey Mantell  Subjective/Objective Assessment:   Pt admitted for SOB.     Action/Plan:   CM received referral for PCP. CM did try to spaek to pt, hwever she was in procedure. Pt with Medicaid insurance she should have been appointed a physician. CM did leave the [facility] Number with pt to call for PCP.   Anticipated DC Date:  06/26/2014   Anticipated DC Plan:  HOME/SELF CARE      DC Planning Services  CM consult      Choice offered to / List presented to:             Status of service:  Completed, signed off Medicare Important Message given?  NO (If response is "NO", the following Medicare IM given date fields will be blank) Date Medicare IM given:   Medicare IM given by:   Date Additional Medicare IM given:   Additional Medicare IM given by:    Discharge Disposition:  HOME/SELF CARE  Per UR Regulation:  Reviewed for med. necessity/level of care/duration of stay  If discussed at Long Length of Stay Meetings, dates discussed:    Comments:

## 2014-06-25 NOTE — Plan of Care (Signed)
Problem: Consults Goal: Skin Care Protocol Initiated - if Braden Score 18 or less If consults are not indicated, leave blank or document N/A Outcome: Not Applicable Date Met:  07/28/51 Braden is > 18 Goal: Tobacco Cessation referral if indicated Outcome: Not Applicable Date Met:  04/13/22 Pt does not smoke.  Problem: Phase I Progression Outcomes Goal: Aspirin unless contraindicated Outcome: Completed/Met Date Met:  06/25/14 Pt given ASA at [facility] prior to arrival

## 2014-06-25 NOTE — Consult Note (Signed)
Admit date: 06/24/2014 Referring Physician  Dr. Susie CassetteAbrol Primary Physician  None Primary Cardiologist  None Reason for Consultation  Chest pain  HPI: Teresa Valenzuela is a 43 y.o. female with history of chronic pancreatitis and hypertension who started experiencing chest pain over the last 3 days. Patient's chest pain is retrosternal pressure-like radiating to the back and shoulders and increases on exertion with mild shortness of breath. It has been occurring intermittently for last 3 days and lasts for half an hour to one hour and gets relieved by rest. In the ER cardiac markers chest x-ray and EKG were unremarkable and patient's CP  improved with nitroglycerin. Patient is chest pain-free at this time and admitted for further management. Cardiology is now asked to evaluate for further workup of chest pain.    PMH:   Past Medical History  Diagnosis Date  . Pancreatic cyst   . Pancreatitis   . Ovarian cyst   . Hypertension      PSH:   Past Surgical History  Procedure Laterality Date  . Cholecystectomy    . Tubal ligation      Allergies:  Zofran and Reglan Prior to Admit Meds:   Prescriptions prior to admission  Medication Sig Dispense Refill  . aspirin 81 MG tablet Take 81 mg by mouth daily.      Marland Kitchen. HYDROCHLOROTHIAZIDE PO Take by mouth.      . Buprenorphine HCl-Naloxone HCl (SUBOXONE SL) Place 1 tablet under the tongue daily.       . cephALEXin (KEFLEX) 500 MG capsule Take 1 capsule (500 mg total) by mouth 4 (four) times daily.  40 capsule  0  . oxyCODONE-acetaminophen (PERCOCET) 5-325 MG per tablet Take 1-2 tablets by mouth every 6 (six) hours as needed.  20 tablet  0  . promethazine (PHENERGAN) 25 MG suppository Place 1 suppository (25 mg total) rectally every 6 (six) hours as needed for nausea or vomiting.  12 each  0  . promethazine (PHENERGAN) 25 MG tablet Take 1 tablet (25 mg total) by mouth every 8 (eight) hours as needed for nausea or vomiting.  20 tablet  0   Fam HX:      Family History  Problem Relation Age of Onset  . CAD Neg Hx    Social HX:    History   Social History  . Marital Status: Married    Spouse Name: N/A    Number of Children: N/A  . Years of Education: N/A   Occupational History  . Not on file.   Social History Main Topics  . Smoking status: Never Smoker   . Smokeless tobacco: Never Used  . Alcohol Use: No  . Drug Use: Yes    Special: Marijuana  . Sexual Activity: Not on file   Other Topics Concern  . Not on file   Social History Narrative  . No narrative on file     ROS:  All 11 ROS were addressed and are negative except what is stated in the HPI  Physical Exam: Blood pressure 132/87, pulse 74, temperature 98.2 F (36.8 C), temperature source Oral, resp. rate 18, height 5\' 7"  (1.702 m), weight 265 lb (120.203 kg), last menstrual period 06/02/2014, SpO2 100.00%.    General: Well developed, well nourished, in no acute distress Head: Eyes PERRLA, No xanthomas.   Normal cephalic and atramatic  Lungs:   Clear bilaterally to auscultation and percussion. Heart:   HRRR S1 S2 Pulses are 2+ & equal.  No carotid bruit. No JVD.  No abdominal bruits. No femoral bruits. Abdomen: Bowel sounds are positive, abdomen soft and non-tender without masses  Extremities:   No clubbing, cyanosis or edema.  DP +1 Neuro: Alert and oriented X 3. Psych:  Good affect, responds appropriately    Labs:   Lab Results  Component Value Date   WBC 8.2 06/25/2014   HGB 12.1 06/25/2014   HCT 36.7 06/25/2014   MCV 87.6 06/25/2014   PLT 235 06/25/2014    Recent Labs Lab 06/25/14 0605  NA 141  K 3.3*  CL 103  CO2 24  BUN 13  CREATININE 0.66  CALCIUM 8.7  PROT 7.3  BILITOT 0.4  ALKPHOS 66  ALT 14  AST 14  GLUCOSE 97   No results found for this basename: PTT   No results found for this basename: INR, PROTIME   Lab Results  Component Value Date   TROPONINI <0.30 06/25/2014     Lab Results  Component Value Date    CHOL  Value: 152        ATP III CLASSIFICATION:  <200     mg/dL   Desirable  161-096200-239  mg/dL   Borderline High  >=045>=240    mg/dL   High        4/09/81194/29/2011   Lab Results  Component Value Date   HDL 30* 01/08/2010   Lab Results  Component Value Date   LDLCALC  Value: 91        Total Cholesterol/HDL:CHD Risk Coronary Heart Disease Risk Table                     Men   Women  1/2 Average Risk   3.4   3.3  Average Risk       5.0   4.4  2 X Average Risk   9.6   7.1  3 X Average Risk  23.4   11.0        Use the calculated Patient Ratio above and the CHD Risk Table to determine the patient's CHD Risk.        ATP III CLASSIFICATION (LDL):  <100     mg/dL   Optimal  147-829100-129  mg/dL   Near or Above                    Optimal  130-159  mg/dL   Borderline  562-130160-189  mg/dL   High  >865>190     mg/dL   Very High 7/84/69624/29/2011   Lab Results  Component Value Date   TRIG 153* 01/08/2010   Lab Results  Component Value Date   CHOLHDL 5.1 01/08/2010   No results found for this basename: LDLDIRECT      Radiology:  Dg Chest 2 View  06/24/2014   CLINICAL DATA:  Chest pain for 2 days. CHEST PAIN LEFT upper extremity pain.  EXAM: CHEST  2 VIEW  COMPARISON:  10/09/2013.  FINDINGS: Cardiopericardial silhouette within normal limits. Mediastinal contours normal. Trachea midline. No airspace disease or effusion.  IMPRESSION: No active cardiopulmonary disease.   Electronically Signed   By: Andreas NewportGeoffrey  Lamke M.D.   On: 06/24/2014 22:52    EKG:  NSR with no ST changes  ASSESSMENT:  1.  Exertional chest pain associated with SOB.  Her only CRF is HTN.  She also has a history of pancreatitis in the past and pancreatic cyst and had some upper abdominal pain on admit last PM.  Lipase and amylase are pending.  EKG is nonischemic.  Cardiac markers negative x 2. 2.  HTN controlled 3.  History of chronic pancreatitis with RUQ tenderness on admission - lipase and abdominal US pending 4.  Hypokalemia  PLAN:   1.  NPO 2.  Stress echo today to  rule out ischemia 3.  Replete potassium  Quintella Reichert, MD  06/25/2014  8:57 AM

## 2014-06-25 NOTE — Discharge Summary (Signed)
Physician Discharge Summary  Teresa Valenzuela MRN: [id number] DOB/AGE: [birth year] 44 y.o.  PCP: No PCP Per Patient   Admit date: 06/24/2014 Discharge date: 06/25/2014  Discharge Diagnoses:      Chest pain Morbid obesity History of chronic pancreatitis   Essential hypertension, benign   Hypokalemia  Followup recommendations Follow up with PCP in 5-7 days Patient needs a repeat BMP in one week to followup on hyperkalemia     Medication List    STOP taking these medications       cephALEXin 500 MG capsule  Commonly known as:  KEFLEX      TAKE these medications       aspirin 81 MG tablet  Take 81 mg by mouth daily.     HYDROCHLOROTHIAZIDE PO  Take by mouth.     oxyCODONE-acetaminophen 5-325 MG per tablet  Commonly known as:  PERCOCET  Take 1-2 tablets by mouth every 6 (six) hours as needed.     promethazine 25 MG suppository  Commonly known as:  PHENERGAN  Place 1 suppository (25 mg total) rectally every 6 (six) hours as needed for nausea or vomiting.     promethazine 25 MG tablet  Commonly known as:  PHENERGAN  Take 1 tablet (25 mg total) by mouth every 8 (eight) hours as needed for nausea or vomiting.     SUBOXONE SL  Place 1 tablet under the tongue daily.        Discharge Condition: Stable  Disposition: 01-Home or Self Care   Consults: Cardiology  Significant Diagnostic Studies: Dg Chest 2 View  06/24/2014   CLINICAL DATA:  Chest pain for 2 days. CHEST PAIN LEFT upper extremity pain.  EXAM: CHEST  2 VIEW  COMPARISON:  10/09/2013.  FINDINGS: Cardiopericardial silhouette within normal limits. Mediastinal contours normal. Trachea midline. No airspace disease or effusion.  IMPRESSION: No active cardiopulmonary disease.   Electronically Signed   By: Dereck Ligas M.D.   On: 06/24/2014 22:52   US Abdomen Complete  06/25/2014   CLINICAL DATA:  Generalized abdominal pain.  EXAM: ULTRASOUND ABDOMEN COMPLETE  COMPARISON:  CT scan of August 18, 2013.  FINDINGS: Gallbladder: Status post cholecystectomy.  Common bile duct: Diameter: 10 mm which most likely is related to patient's post cholecystectomy status.  Liver: Increased echogenicity of hepatic parenchyma is noted consistent with fatty infiltration. No focal abnormality is noted.  IVC: No abnormality visualized.  Pancreas: Visualized portion unremarkable.  Tail is not visualized.  Spleen: Multiple small calcifications are noted suggesting granulomata.  Right Kidney: Length: 11.9 cm. Echogenicity within normal limits. No mass or hydronephrosis visualized.  Left Kidney: Length: 11.7 cm. Echogenicity within normal limits. No mass or hydronephrosis visualized.  Abdominal aorta: No aneurysm visualized.  Other findings: None.  IMPRESSION: Status post cholecystectomy. Common bile duct is mildly dilated at 10 mm which most likely is related to patient's post cholecystectomy status. If there are elevated liver function test, further evaluation with MRCP would be recommended.  Fatty infiltration of the liver.   Electronically Signed   By: Sabino Dick M.D.   On: 06/25/2014 10:06      Microbiology: No results found for this or any previous visit (from the past 240 hour(s)).   Labs: Results for orders placed during the hospital encounter of 06/24/14 (from the past 48 hour(s))  CBC WITH DIFFERENTIAL     Status: Abnormal   Collection Time    06/24/14 10:20 PM      Result Value  Ref Range   WBC 11.7 (*) 4.0 - 10.5 K/uL   RBC 4.36  3.87 - 5.11 MIL/uL   Hemoglobin 12.7  12.0 - 15.0 g/dL   HCT 38.0  36.0 - 46.0 %   MCV 87.2  78.0 - 100.0 fL   MCH 29.1  26.0 - 34.0 pg   MCHC 33.4  30.0 - 36.0 g/dL   RDW 12.3  11.5 - 15.5 %   Platelets 267  150 - 400 K/uL   Neutrophils Relative % 65  43 - 77 %   Neutro Abs 7.6  1.7 - 7.7 K/uL   Lymphocytes Relative 26  12 - 46 %   Lymphs Abs 3.0  0.7 - 4.0 K/uL   Monocytes Relative 8  3 - 12 %   Monocytes Absolute 0.9  0.1 - 1.0 K/uL   Eosinophils Relative 1  0  - 5 %   Eosinophils Absolute 0.2  0.0 - 0.7 K/uL   Basophils Relative 0  0 - 1 %   Basophils Absolute 0.0  0.0 - 0.1 K/uL  COMPREHENSIVE METABOLIC PANEL     Status: Abnormal   Collection Time    06/24/14 10:20 PM      Result Value Ref Range   Sodium 140  137 - 147 mEq/L   Potassium 3.3 (*) 3.7 - 5.3 mEq/L   Chloride 101  96 - 112 mEq/L   CO2 26  19 - 32 mEq/L   Glucose, Bld 110 (*) 70 - 99 mg/dL   BUN 14  6 - 23 mg/dL   Creatinine, Ser 0.80  0.50 - 1.10 mg/dL   Calcium 9.1  8.4 - 10.5 mg/dL   Total Protein 7.6  6.0 - 8.3 g/dL   Albumin 3.7  3.5 - 5.2 g/dL   AST 18  0 - 37 U/L   ALT 11  0 - 35 U/L   Alkaline Phosphatase 79  39 - 117 U/L   Total Bilirubin 0.3  0.3 - 1.2 mg/dL   GFR calc non Af Amer 88 (*) >90 mL/min   GFR calc Af Amer >90  >90 mL/min   Comment: (NOTE)     The eGFR has been calculated using the CKD EPI equation.     This calculation has not been validated in all clinical situations.     eGFR's persistently <90 mL/min signify possible Chronic Kidney     Disease.   Anion gap 13  5 - 15  TROPONIN I     Status: None   Collection Time    06/24/14 10:20 PM      Result Value Ref Range   Troponin I <0.30  <0.30 ng/mL   Comment:            Due to the release kinetics of cTnI,     a negative result within the first hours     of the onset of symptoms does not rule out     myocardial infarction with certainty.     If myocardial infarction is still suspected,     repeat the test at appropriate intervals.  TROPONIN I     Status: None   Collection Time    06/25/14  6:05 AM      Result Value Ref Range   Troponin I <0.30  <0.30 ng/mL   Comment:            Due to the release kinetics of cTnI,     a negative result within the  first hours     of the onset of symptoms does not rule out     myocardial infarction with certainty.     If myocardial infarction is still suspected,     repeat the test at appropriate intervals.  LIPASE, BLOOD     Status: None   Collection Time     06/25/14  6:05 AM      Result Value Ref Range   Lipase 49  11 - 59 U/L  COMPREHENSIVE METABOLIC PANEL     Status: Abnormal   Collection Time    06/25/14  6:05 AM      Result Value Ref Range   Sodium 141  137 - 147 mEq/L   Potassium 3.3 (*) 3.7 - 5.3 mEq/L   Chloride 103  96 - 112 mEq/L   CO2 24  19 - 32 mEq/L   Glucose, Bld 97  70 - 99 mg/dL   BUN 13  6 - 23 mg/dL   Creatinine, Ser 0.66  0.50 - 1.10 mg/dL   Calcium 8.7  8.4 - 10.5 mg/dL   Total Protein 7.3  6.0 - 8.3 g/dL   Albumin 3.4 (*) 3.5 - 5.2 g/dL   AST 14  0 - 37 U/L   ALT 14  0 - 35 U/L   Alkaline Phosphatase 66  39 - 117 U/L   Total Bilirubin 0.4  0.3 - 1.2 mg/dL   GFR calc non Af Amer >90  >90 mL/min   GFR calc Af Amer >90  >90 mL/min   Comment: (NOTE)     The eGFR has been calculated using the CKD EPI equation.     This calculation has not been validated in all clinical situations.     eGFR's persistently <90 mL/min signify possible Chronic Kidney     Disease.   Anion gap 14  5 - 15  TSH     Status: None   Collection Time    06/25/14  6:05 AM      Result Value Ref Range   TSH 1.300  0.350 - 4.500 uIU/mL  CBC WITH DIFFERENTIAL     Status: None   Collection Time    06/25/14  6:05 AM      Result Value Ref Range   WBC 8.2  4.0 - 10.5 K/uL   RBC 4.19  3.87 - 5.11 MIL/uL   Hemoglobin 12.1  12.0 - 15.0 g/dL   HCT 36.7  36.0 - 46.0 %   MCV 87.6  78.0 - 100.0 fL   MCH 28.9  26.0 - 34.0 pg   MCHC 33.0  30.0 - 36.0 g/dL   RDW 12.4  11.5 - 15.5 %   Platelets 235  150 - 400 K/uL   Neutrophils Relative % 62  43 - 77 %   Neutro Abs 5.1  1.7 - 7.7 K/uL   Lymphocytes Relative 28  12 - 46 %   Lymphs Abs 2.3  0.7 - 4.0 K/uL   Monocytes Relative 7  3 - 12 %   Monocytes Absolute 0.6  0.1 - 1.0 K/uL   Eosinophils Relative 2  0 - 5 %   Eosinophils Absolute 0.1  0.0 - 0.7 K/uL   Basophils Relative 1  0 - 1 %   Basophils Absolute 0.0  0.0 - 0.1 K/uL  D-DIMER, QUANTITATIVE     Status: None   Collection Time     06/25/14  6:05 AM      Result Value Ref  Range   D-Dimer, Quant 0.48  0.00 - 0.48 ug/mL-FEU   Comment:            AT THE INHOUSE ESTABLISHED CUTOFF     VALUE OF 0.48 ug/mL FEU,     THIS ASSAY HAS BEEN DOCUMENTED     IN THE LITERATURE TO HAVE     A SENSITIVITY AND NEGATIVE     PREDICTIVE VALUE OF AT LEAST     98 TO 99%.  THE TEST RESULT     SHOULD BE CORRELATED WITH     AN ASSESSMENT OF THE CLINICAL     PROBABILITY OF DVT / VTE.  TROPONIN I     Status: None   Collection Time    06/25/14 10:10 AM      Result Value Ref Range   Troponin I <0.30  <0.30 ng/mL   Comment:            Due to the release kinetics of cTnI,     a negative result within the first hours     of the onset of symptoms does not rule out     myocardial infarction with certainty.     If myocardial infarction is still suspected,     repeat the test at appropriate intervals.     HPI :  Teresa Valenzuela is a 43 y.o. female with history of chronic pancreatitis and hypertension who started experiencing chest pain over the last 3 days. Patient's chest pain is retrosternal pressure-like radiating to the back and shoulders and increases on exertion with mild shortness of breath. It has been occurring intermittently for last 3 days and lasts for half an hour to one hour and gets relieved by rest. In the ER cardiac markers chest x-ray and EKG were unremarkable and patient's CP improved with nitroglycerin. Patient is chest pain-free at this time and admitted for further management. Cardiology is now asked to evaluate for further workup of chest pain.     HOSPITAL COURSE:  1. atypical chest pain - given that the patient's chest pain improved with sublingual nitroglycerin, cardiology was consulted, stress echo ordered, results pending , cardiac enzymes negative x3, d-dimer negative, chest x-ray negative, probably  GI related, lipase negative.. Continue patient on Aspirin.  2. Hypertension - continue HCTZ. Patient hypokalemic  probably secondary to HCTZ, supplement potassium, recheck BMP in one week        3.  History of chronic pancreatitis with right upper quadrant tenderness - no function, lipase normal, right upper quadrant ultrasound shows mildly dilated common bile duct probably related to her cholecystectomy, continue suboxone,      Discharge Exam: Blood pressure 132/87, pulse 74, temperature 98.2 F (36.8 C), temperature source Oral, resp. rate 18, height 5' 7"  (1.702 m), weight 120.203 kg (265 lb), last menstrual period 06/02/2014, SpO2 100.00%.   General: Well developed, well nourished, in no acute distress  Head: Eyes PERRLA, No xanthomas. Normal cephalic and atramatic  Lungs: Clear bilaterally to auscultation and percussion.  Heart: HRRR S1 S2 Pulses are 2+ & equal.  No carotid bruit. No JVD. No abdominal bruits. No femoral bruits.  Abdomen: Bowel sounds are positive, abdomen soft and non-tender without masses  Extremities: No clubbing, cyanosis or edema. DP +1  Neuro: Alert and oriented X 3.  Psych: Good affect, responds appropriately         Follow-up Information   Follow up with PCP. Schedule an appointment as soon as possible for a visit in 1 week.  SignedReyne Dumas 06/25/2014, 12:11 PM

## 2014-06-25 NOTE — Progress Notes (Signed)
Echocardiogram Echocardiogram Stress Test has been performed.  Teresa Valenzuela 06/25/2014, 12:55 PM

## 2014-06-26 DIAGNOSIS — R079 Chest pain, unspecified: Secondary | ICD-10-CM

## 2014-06-26 MED ORDER — TRAMADOL HCL 50 MG PO TABS: 50.0000 mg | ORAL_TABLET | Freq: Four times a day (QID) | ORAL | Status: DC | PRN

## 2014-06-26 MED ORDER — PROMETHAZINE HCL 12.5 MG PO TABS: 12.5000 mg | ORAL_TABLET | Freq: Four times a day (QID) | ORAL | Status: DC | PRN

## 2014-06-26 NOTE — Discharge Summary (Signed)
Physician Discharge Summary  Teresa Valenzuela MRN: [id number] DOB/AGE: 1971-07-14 43 y.o.  PCP: No PCP Per Patient   Admit date: 06/24/2014 Discharge date: 06/26/2014  Chest pain  Morbid obesity  History of chronic pancreatitis  Essential hypertension, benign  Hypokalemia    Followup recommendations  Follow up with PCP in 5-7 days  Patient needs a repeat BMP in one week to followup on hyperkalemia         Medication List         aspirin 81 MG tablet  Take 81 mg by mouth daily.     hydrochlorothiazide 25 MG tablet  Commonly known as:  HYDRODIURIL  Take 25 mg by mouth at bedtime.     Potassium Chloride ER 20 MEQ Tbcr  Take 10 mEq by mouth daily.     promethazine 12.5 MG tablet  Commonly known as:  PHENERGAN  Take 1 tablet (12.5 mg total) by mouth every 6 (six) hours as needed for nausea or vomiting.     traMADol 50 MG tablet  Commonly known as:  ULTRAM  Take 1 tablet (50 mg total) by mouth every 6 (six) hours as needed.        Discharge Condition: Stable  Disposition: 01-Home or Self Care   Consults:  Cardiology   Significant Diagnostic Studies: Dg Chest 2 View  06/25/2014   CLINICAL DATA:  Left-sided chest pain. Pain radiates and back and left arm for 4 days. History of hypertension.  EXAM: CHEST  2 VIEW  COMPARISON:  06/24/2014  FINDINGS: The heart size and mediastinal contours are within normal limits. Both lungs are clear. The visualized skeletal structures are unremarkable.  IMPRESSION: No active cardiopulmonary disease.   Electronically Signed   By: Rolm Baptise M.D.   On: 06/25/2014 16:54   Dg Chest 2 View  06/24/2014   CLINICAL DATA:  Chest pain for 2 days. CHEST PAIN LEFT upper extremity pain.  EXAM: CHEST  2 VIEW  COMPARISON:  10/09/2013.  FINDINGS: Cardiopericardial silhouette within normal limits. Mediastinal contours normal. Trachea midline. No airspace disease or effusion.  IMPRESSION: No active cardiopulmonary disease.    Electronically Signed   By: Dereck Ligas M.D.   On: 06/24/2014 22:52   US Abdomen Complete  06/25/2014   CLINICAL DATA:  Generalized abdominal pain.  EXAM: ULTRASOUND ABDOMEN COMPLETE  COMPARISON:  CT scan of August 18, 2013.  FINDINGS: Gallbladder: Status post cholecystectomy.  Common bile duct: Diameter: 10 mm which most likely is related to patient's post cholecystectomy status.  Liver: Increased echogenicity of hepatic parenchyma is noted consistent with fatty infiltration. No focal abnormality is noted.  IVC: No abnormality visualized.  Pancreas: Visualized portion unremarkable.  Tail is not visualized.  Spleen: Multiple small calcifications are noted suggesting granulomata.  Right Kidney: Length: 11.9 cm. Echogenicity within normal limits. No mass or hydronephrosis visualized.  Left Kidney: Length: 11.7 cm. Echogenicity within normal limits. No mass or hydronephrosis visualized.  Abdominal aorta: No aneurysm visualized.  Other findings: None.  IMPRESSION: Status post cholecystectomy. Common bile duct is mildly dilated at 10 mm which most likely is related to patient's post cholecystectomy status. If there are elevated liver function test, further evaluation with MRCP would be recommended.  Fatty infiltration of the liver.   Electronically Signed   By: Sabino Dick M.D.   On: 06/25/2014 10:06      Microbiology: No results found for this or any previous visit (from the past 240 hour(s)).   Labs:  Results for orders placed during the hospital encounter of 06/24/14 (from the past 48 hour(s))  CBC WITH DIFFERENTIAL     Status: Abnormal   Collection Time    06/24/14 10:20 PM      Result Value Ref Range   WBC 11.7 (*) 4.0 - 10.5 K/uL   RBC 4.36  3.87 - 5.11 MIL/uL   Hemoglobin 12.7  12.0 - 15.0 g/dL   HCT 38.0  36.0 - 46.0 %   MCV 87.2  78.0 - 100.0 fL   MCH 29.1  26.0 - 34.0 pg   MCHC 33.4  30.0 - 36.0 g/dL   RDW 12.3  11.5 - 15.5 %   Platelets 267  150 - 400 K/uL   Neutrophils Relative  % 65  43 - 77 %   Neutro Abs 7.6  1.7 - 7.7 K/uL   Lymphocytes Relative 26  12 - 46 %   Lymphs Abs 3.0  0.7 - 4.0 K/uL   Monocytes Relative 8  3 - 12 %   Monocytes Absolute 0.9  0.1 - 1.0 K/uL   Eosinophils Relative 1  0 - 5 %   Eosinophils Absolute 0.2  0.0 - 0.7 K/uL   Basophils Relative 0  0 - 1 %   Basophils Absolute 0.0  0.0 - 0.1 K/uL  COMPREHENSIVE METABOLIC PANEL     Status: Abnormal   Collection Time    06/24/14 10:20 PM      Result Value Ref Range   Sodium 140  137 - 147 mEq/L   Potassium 3.3 (*) 3.7 - 5.3 mEq/L   Chloride 101  96 - 112 mEq/L   CO2 26  19 - 32 mEq/L   Glucose, Bld 110 (*) 70 - 99 mg/dL   BUN 14  6 - 23 mg/dL   Creatinine, Ser 0.80  0.50 - 1.10 mg/dL   Calcium 9.1  8.4 - 10.5 mg/dL   Total Protein 7.6  6.0 - 8.3 g/dL   Albumin 3.7  3.5 - 5.2 g/dL   AST 18  0 - 37 U/L   ALT 11  0 - 35 U/L   Alkaline Phosphatase 79  39 - 117 U/L   Total Bilirubin 0.3  0.3 - 1.2 mg/dL   GFR calc non Af Amer 88 (*) >90 mL/min   GFR calc Af Amer >90  >90 mL/min   Comment: (NOTE)     The eGFR has been calculated using the CKD EPI equation.     This calculation has not been validated in all clinical situations.     eGFR's persistently <90 mL/min signify possible Chronic Kidney     Disease.   Anion gap 13  5 - 15  TROPONIN I     Status: None   Collection Time    06/24/14 10:20 PM      Result Value Ref Range   Troponin I <0.30  <0.30 ng/mL   Comment:            Due to the release kinetics of cTnI,     a negative result within the first hours     of the onset of symptoms does not rule out     myocardial infarction with certainty.     If myocardial infarction is still suspected,     repeat the test at appropriate intervals.  TROPONIN I     Status: None   Collection Time    06/25/14  6:05 AM  Result Value Ref Range   Troponin I <0.30  <0.30 ng/mL   Comment:            Due to the release kinetics of cTnI,     a negative result within the first hours     of the  onset of symptoms does not rule out     myocardial infarction with certainty.     If myocardial infarction is still suspected,     repeat the test at appropriate intervals.  LIPASE, BLOOD     Status: None   Collection Time    06/25/14  6:05 AM      Result Value Ref Range   Lipase 49  11 - 59 U/L  COMPREHENSIVE METABOLIC PANEL     Status: Abnormal   Collection Time    06/25/14  6:05 AM      Result Value Ref Range   Sodium 141  137 - 147 mEq/L   Potassium 3.3 (*) 3.7 - 5.3 mEq/L   Chloride 103  96 - 112 mEq/L   CO2 24  19 - 32 mEq/L   Glucose, Bld 97  70 - 99 mg/dL   BUN 13  6 - 23 mg/dL   Creatinine, Ser 0.66  0.50 - 1.10 mg/dL   Calcium 8.7  8.4 - 10.5 mg/dL   Total Protein 7.3  6.0 - 8.3 g/dL   Albumin 3.4 (*) 3.5 - 5.2 g/dL   AST 14  0 - 37 U/L   ALT 14  0 - 35 U/L   Alkaline Phosphatase 66  39 - 117 U/L   Total Bilirubin 0.4  0.3 - 1.2 mg/dL   GFR calc non Af Amer >90  >90 mL/min   GFR calc Af Amer >90  >90 mL/min   Comment: (NOTE)     The eGFR has been calculated using the CKD EPI equation.     This calculation has not been validated in all clinical situations.     eGFR's persistently <90 mL/min signify possible Chronic Kidney     Disease.   Anion gap 14  5 - 15  TSH     Status: None   Collection Time    06/25/14  6:05 AM      Result Value Ref Range   TSH 1.300  0.350 - 4.500 uIU/mL  CBC WITH DIFFERENTIAL     Status: None   Collection Time    06/25/14  6:05 AM      Result Value Ref Range   WBC 8.2  4.0 - 10.5 K/uL   RBC 4.19  3.87 - 5.11 MIL/uL   Hemoglobin 12.1  12.0 - 15.0 g/dL   HCT 36.7  36.0 - 46.0 %   MCV 87.6  78.0 - 100.0 fL   MCH 28.9  26.0 - 34.0 pg   MCHC 33.0  30.0 - 36.0 g/dL   RDW 12.4  11.5 - 15.5 %   Platelets 235  150 - 400 K/uL   Neutrophils Relative % 62  43 - 77 %   Neutro Abs 5.1  1.7 - 7.7 K/uL   Lymphocytes Relative 28  12 - 46 %   Lymphs Abs 2.3  0.7 - 4.0 K/uL   Monocytes Relative 7  3 - 12 %   Monocytes Absolute 0.6  0.1 - 1.0  K/uL   Eosinophils Relative 2  0 - 5 %   Eosinophils Absolute 0.1  0.0 - 0.7 K/uL   Basophils Relative 1  0 - 1 %   Basophils Absolute 0.0  0.0 - 0.1 K/uL  D-DIMER, QUANTITATIVE     Status: None   Collection Time    06/25/14  6:05 AM      Result Value Ref Range   D-Dimer, Quant 0.48  0.00 - 0.48 ug/mL-FEU   Comment:            AT THE INHOUSE ESTABLISHED CUTOFF     VALUE OF 0.48 ug/mL FEU,     THIS ASSAY HAS BEEN DOCUMENTED     IN THE LITERATURE TO HAVE     A SENSITIVITY AND NEGATIVE     PREDICTIVE VALUE OF AT LEAST     98 TO 99%.  THE TEST RESULT     SHOULD BE CORRELATED WITH     AN ASSESSMENT OF THE CLINICAL     PROBABILITY OF DVT / VTE.  TROPONIN I     Status: None   Collection Time    06/25/14 10:10 AM      Result Value Ref Range   Troponin I <0.30  <0.30 ng/mL   Comment:            Due to the release kinetics of cTnI,     a negative result within the first hours     of the onset of symptoms does not rule out     myocardial infarction with certainty.     If myocardial infarction is still suspected,     repeat the test at appropriate intervals.  TROPONIN I     Status: None   Collection Time    06/25/14  3:15 PM      Result Value Ref Range   Troponin I <0.30  <0.30 ng/mL   Comment:            Due to the release kinetics of cTnI,     a negative result within the first hours     of the onset of symptoms does not rule out     myocardial infarction with certainty.     If myocardial infarction is still suspected,     repeat the test at appropriate intervals.    HPI :  Teresa Valenzuela is a 43 y.o. female with history of chronic pancreatitis and hypertension who started experiencing chest pain over the last 3 days. Patient's chest pain is retrosternal pressure-like radiating to the back and shoulders and increases on exertion with mild shortness of breath. It has been occurring intermittently for last 3 days and lasts for half an hour to one hour and gets relieved by rest.  In the ER cardiac markers chest x-ray and EKG were unremarkable and patient's CP improved with nitroglycerin. Patient is chest pain-free at this time and admitted for further management. Cardiology is now asked to evaluate for further workup of chest pain.    HOSPITAL COURSE:  atypical chest pain - given that the patient's chest pain improved with sublingual nitroglycerin, cardiology was consulted, stress echo was done and results within normal limits. As per cardiology no need for further inpatient cardiac workup , cardiac enzymes negative x3, d-dimer negative, chest x-ray negative, probably GI related, lipase negative.. Continue patient on Aspirin. Patient ambulated 300 feet without any complaints of shortness of breath and was 100% prior to discharge.  Hypertension - continue HCTZ. Patient hypokalemic probably secondary to HCTZ, supplement potassium, recheck BMP in one week   3. History of chronic pancreatitis with right upper quadrant tenderness - no function,  lipase normal, right upper quadrant ultrasound shows mildly dilated common bile duct probably related to her cholecystectomy, patient along with taking suboxone, switch to tramadol for pain control and Phenergan for nausea.     Discharge Exam:    Blood pressure 104/73, pulse 57, temperature 97.6 F (36.4 C), temperature source Oral, resp. rate 18, height 5' 7" (1.702 m), weight 120.203 kg (265 lb), last menstrual period 06/02/2014, SpO2 99.00%.  General: Well developed, well nourished, in no acute distress  Head: Eyes PERRLA, No xanthomas. Normal cephalic and atramatic  Lungs: Clear bilaterally to auscultation and percussion.  Heart: HRRR S1 S2 Pulses are 2+ & equal.  No carotid bruit. No JVD. No abdominal bruits. No femoral bruits.  Abdomen: Bowel sounds are positive, abdomen soft and non-tender without masses  Extremities: No clubbing, cyanosis or edema. DP +1  Neuro: Alert and oriented X 3.  Psych: Good affect, responds  appropriately             Follow-up Information   Follow up with PCP. Schedule an appointment as soon as possible for a visit in 1 week.      SignedReyne Dumas 06/26/2014, 12:38 PM

## 2014-06-26 NOTE — Progress Notes (Signed)
Patient ID: Teresa Valenzuela, female   DOB: [birth year], 43 y.o.   MRN: [id number]    Subjective:  Denies SSCP, palpitations or Dyspnea   Objective:  Filed Vitals:   06/25/14 1511 06/25/14 1518 06/25/14 2100 06/26/14 0528  BP: 121/86  115/72 104/73  Pulse: 94 85 66 57  Temp: 97.7 F (36.5 C)  98.3 F (36.8 C) 97.6 F (36.4 C)  TempSrc: Oral   Oral  Resp: 16  18 18   Height:      Weight:      SpO2: 91% 100% 99% 99%    Intake/Output from previous day: No intake or output data in the 24 hours ending 06/26/14 [id number]  Physical Exam: Affect appropriate Healthy:  appears stated age HEENT: normal Neck supple with no adenopathy JVP normal no bruits no thyromegaly Lungs clear with no wheezing and good diaphragmatic motion Heart:  S1/S2 no murmur, no rub, gallop or click PMI normal Abdomen: mild epigastric pain , BS positve, no tenderness, no AAA no bruit.  No HSM or HJR Distal pulses intact with no bruits No edema Neuro non-focal Skin warm and dry No muscular weakness   Lab Results: Basic Metabolic Panel:  Recent Labs  11/91/4710/13/15 2220 06/25/14 0605  NA 140 141  K 3.3* 3.3*  CL 101 103  CO2 26 24  GLUCOSE 110* 97  BUN 14 13  CREATININE 0.80 0.66  CALCIUM 9.1 8.7   Liver Function Tests:  Recent Labs  06/24/14 2220 06/25/14 0605  AST 18 14  ALT 11 14  ALKPHOS 79 66  BILITOT 0.3 0.4  PROT 7.6 7.3  ALBUMIN 3.7 3.4*    Recent Labs  06/25/14 0605  LIPASE 49   CBC:  Recent Labs  06/24/14 2220 06/25/14 0605  WBC 11.7* 8.2  NEUTROABS 7.6 5.1  HGB 12.7 12.1  HCT 38.0 36.7  MCV 87.2 87.6  PLT 267 235   Cardiac Enzymes:  Recent Labs  06/25/14 0605 06/25/14 1010 06/25/14 1515  TROPONINI <0.30 <0.30 <0.30   BNP: No components found with this basename: POCBNP,  D-Dimer:  Recent Labs  06/25/14 0605  DDIMER 0.48   Thyroid Function Tests:  Recent Labs  06/25/14 0605  TSH 1.300    Imaging: Dg Chest 2 View  06/25/2014   CLINICAL  DATA:  Left-sided chest pain. Pain radiates and back and left arm for 4 days. History of hypertension.  EXAM: CHEST  2 VIEW  COMPARISON:  06/24/2014  FINDINGS: The heart size and mediastinal contours are within normal limits. Both lungs are clear. The visualized skeletal structures are unremarkable.  IMPRESSION: No active cardiopulmonary disease.   Electronically Signed   By: Charlett NoseKevin  Dover M.D.   On: 06/25/2014 16:54   Dg Chest 2 View  06/24/2014   CLINICAL DATA:  Chest pain for 2 days. CHEST PAIN LEFT upper extremity pain.  EXAM: CHEST  2 VIEW  COMPARISON:  10/09/2013.  FINDINGS: Cardiopericardial silhouette within normal limits. Mediastinal contours normal. Trachea midline. No airspace disease or effusion.  IMPRESSION: No active cardiopulmonary disease.   Electronically Signed   By: Andreas NewportGeoffrey  Lamke M.D.   On: 06/24/2014 22:52   [place] Abdomen Complete  06/25/2014   CLINICAL DATA:  Generalized abdominal pain.  EXAM: ULTRASOUND ABDOMEN COMPLETE  COMPARISON:  CT scan of August 18, 2013.  FINDINGS: Gallbladder: Status post cholecystectomy.  Common bile duct: Diameter: 10 mm which most likely is related to patient's post cholecystectomy status.  Liver: Increased echogenicity of hepatic parenchyma is  noted consistent with fatty infiltration. No focal abnormality is noted.  IVC: No abnormality visualized.  Pancreas: Visualized portion unremarkable.  Tail is not visualized.  Spleen: Multiple small calcifications are noted suggesting granulomata.  Right Kidney: Length: 11.9 cm. Echogenicity within normal limits. No mass or hydronephrosis visualized.  Left Kidney: Length: 11.7 cm. Echogenicity within normal limits. No mass or hydronephrosis visualized.  Abdominal aorta: No aneurysm visualized.  Other findings: None.  IMPRESSION: Status post cholecystectomy. Common bile duct is mildly dilated at 10 mm which most likely is related to patient's post cholecystectomy status. If there are elevated liver function test, further  evaluation with MRCP would be recommended.  Fatty infiltration of the liver.   Electronically Signed   By: Roque LiasJames  Green M.D.   On: 06/25/2014 10:06    Cardiac Studies:  ECG:  NSR no acute changes     Telemetry:  NSR no arrhythmia  Echo:  Normal stress echo EF 75%   Medications:   . aspirin EC  325 mg Oral Daily  . enoxaparin (LOVENOX) injection  40 mg Subcutaneous Q24H  . hydrochlorothiazide  25 mg Oral Daily  . sodium chloride  3 mL Intravenous Q12H       Assessment/Plan:  Chest Pain:  Atypical  R/O no ECG changes normal stress echo  No need for further inpatient cardiac w/u HTN:  On HCTZ  Supplement K Pancreatitis:  History of  Amylase lipase negative plan per primary service    Teresa Valenzuela 06/26/2014, 8:29 AM

## 2014-06-26 NOTE — Progress Notes (Signed)
Patient Ambulated 300 ft no complaint of Pain or SOB 02 92 -100 percent on Room Air.

## 2014-07-17 ENCOUNTER — Emergency Department (HOSPITAL_BASED_OUTPATIENT_CLINIC_OR_DEPARTMENT_OTHER)
Admission: EM | Admit: 2014-07-17 | Discharge: 2014-07-17 | Discharge status: Against Medical Advice | Payer: Medicaid Other | Attending: Emergency Medicine | Admitting: Emergency Medicine

## 2014-07-17 ENCOUNTER — Encounter (HOSPITAL_BASED_OUTPATIENT_CLINIC_OR_DEPARTMENT_OTHER): Payer: Self-pay | Admitting: *Deleted

## 2014-07-17 DIAGNOSIS — R112 Nausea with vomiting, unspecified: Secondary | ICD-10-CM | POA: Insufficient documentation

## 2014-07-17 DIAGNOSIS — R109 Unspecified abdominal pain: Secondary | ICD-10-CM

## 2014-07-17 DIAGNOSIS — I10 Essential (primary) hypertension: Secondary | ICD-10-CM | POA: Insufficient documentation

## 2014-07-17 LAB — URINE MICROSCOPIC-ADD ON

## 2014-07-17 LAB — URINALYSIS, ROUTINE W REFLEX MICROSCOPIC
Bilirubin Urine: NEGATIVE
GLUCOSE, UA: NEGATIVE mg/dL
Ketones, ur: NEGATIVE mg/dL
NITRITE: NEGATIVE
PH: 6.5 (ref 5.0–8.0)
Protein, ur: NEGATIVE mg/dL
Specific Gravity, Urine: 1.013 (ref 1.005–1.030)
Urobilinogen, UA: 1 mg/dL (ref 0.0–1.0)

## 2014-07-17 NOTE — ED Notes (Signed)
No answer in waiting room 

## 2014-07-17 NOTE — ED Notes (Signed)
No answer when called 

## 2014-07-17 NOTE — ED Notes (Signed)
Abdominal pain and nausea for a week. Vomited x 3 last night.

## 2014-07-18 NOTE — ED Provider Notes (Signed)
Patient left without being seen.   Teresa Canalavid H Mckinzi Eriksen, MD 07/18/14 [phone]

## 2014-09-22 ENCOUNTER — Emergency Department (HOSPITAL_BASED_OUTPATIENT_CLINIC_OR_DEPARTMENT_OTHER)
Admission: EM | Admit: 2014-09-22 | Discharge: 2014-09-22 | Disposition: A | Discharge status: Home / Self Care | Payer: MEDICAID | Attending: Emergency Medicine | Admitting: Emergency Medicine

## 2014-09-22 ENCOUNTER — Encounter (HOSPITAL_BASED_OUTPATIENT_CLINIC_OR_DEPARTMENT_OTHER): Payer: Self-pay

## 2014-09-22 DIAGNOSIS — Z3202 Encounter for pregnancy test, result negative: Secondary | ICD-10-CM | POA: Insufficient documentation

## 2014-09-22 DIAGNOSIS — K861 Other chronic pancreatitis: Secondary | ICD-10-CM

## 2014-09-22 DIAGNOSIS — I10 Essential (primary) hypertension: Secondary | ICD-10-CM | POA: Insufficient documentation

## 2014-09-22 DIAGNOSIS — Z7982 Long term (current) use of aspirin: Secondary | ICD-10-CM | POA: Insufficient documentation

## 2014-09-22 DIAGNOSIS — Z79899 Other long term (current) drug therapy: Secondary | ICD-10-CM | POA: Insufficient documentation

## 2014-09-22 DIAGNOSIS — Z8742 Personal history of other diseases of the female genital tract: Secondary | ICD-10-CM | POA: Insufficient documentation

## 2014-09-22 DIAGNOSIS — R110 Nausea: Secondary | ICD-10-CM

## 2014-09-22 LAB — COMPREHENSIVE METABOLIC PANEL
ALBUMIN: 3.5 g/dL (ref 3.5–5.2)
ALT: 18 U/L (ref 0–35)
ANION GAP: 5 (ref 5–15)
AST: 22 U/L (ref 0–37)
Alkaline Phosphatase: 69 U/L (ref 39–117)
BILIRUBIN TOTAL: 0.5 mg/dL (ref 0.3–1.2)
BUN: 12 mg/dL (ref 6–23)
CHLORIDE: 103 meq/L (ref 96–112)
CO2: 28 mmol/L (ref 19–32)
Calcium: 8.1 mg/dL — ABNORMAL LOW (ref 8.4–10.5)
Creatinine, Ser: 0.62 mg/dL (ref 0.50–1.10)
GFR calc Af Amer: >90 mL/min (ref >90)
GFR calc non Af Amer: >90 mL/min (ref >90)
GLUCOSE: 111 mg/dL — AB (ref 70–99)
POTASSIUM: 3.6 mmol/L (ref 3.5–5.1)
SODIUM: 136 mmol/L (ref 135–145)
Total Protein: 6.8 g/dL (ref 6.0–8.3)

## 2014-09-22 LAB — CBC WITH DIFFERENTIAL/PLATELET
BASOS ABS: 0 10*3/uL (ref 0.0–0.1)
Basophils Relative: 1 % (ref 0–1)
EOS ABS: 0.3 10*3/uL (ref 0.0–0.7)
EOS PCT: 4 % (ref 0–5)
HCT: 35.3 % — ABNORMAL LOW (ref 36.0–46.0)
HEMOGLOBIN: 11.3 g/dL — AB (ref 12.0–15.0)
Lymphocytes Relative: 26 % (ref 12–46)
Lymphs Abs: 2.2 10*3/uL (ref 0.7–4.0)
MCH: 27.7 pg (ref 26.0–34.0)
MCHC: 32 g/dL (ref 30.0–36.0)
MCV: 86.5 fL (ref 78.0–100.0)
Monocytes Absolute: 0.7 10*3/uL (ref 0.1–1.0)
Monocytes Relative: 9 % (ref 3–12)
NEUTROS ABS: 5.1 10*3/uL (ref 1.7–7.7)
Neutrophils Relative %: 60 % (ref 43–77)
PLATELETS: 234 10*3/uL (ref 150–400)
RBC: 4.08 MIL/uL (ref 3.87–5.11)
RDW: 13.2 % (ref 11.5–15.5)
WBC: 8.4 10*3/uL (ref 4.0–10.5)

## 2014-09-22 LAB — URINE MICROSCOPIC-ADD ON

## 2014-09-22 LAB — URINALYSIS, ROUTINE W REFLEX MICROSCOPIC
Bilirubin Urine: NEGATIVE
Glucose, UA: NEGATIVE mg/dL
HGB URINE DIPSTICK: NEGATIVE
Ketones, ur: NEGATIVE mg/dL
NITRITE: NEGATIVE
PH: 7 (ref 5.0–8.0)
PROTEIN: NEGATIVE mg/dL
Specific Gravity, Urine: 1.02 (ref 1.005–1.030)
Urobilinogen, UA: 1 mg/dL (ref 0.0–1.0)

## 2014-09-22 LAB — LIPASE, BLOOD: Lipase: 36 U/L (ref 11–59)

## 2014-09-22 LAB — PREGNANCY, URINE: Preg Test, Ur: NEGATIVE

## 2014-09-22 MED ORDER — PROMETHAZINE HCL 12.5 MG PO TABS: 12.5000 mg | ORAL_TABLET | Freq: Four times a day (QID) | ORAL | Status: DC | PRN

## 2014-09-22 MED ORDER — HYDROMORPHONE HCL 1 MG/ML IJ SOLN
1.0000 mg | Freq: Once | INTRAMUSCULAR | Status: AC
  Administered 2014-09-22: 1 mg via INTRAVENOUS
  Filled 2014-09-22: qty 1

## 2014-09-22 MED ORDER — PROMETHAZINE HCL 25 MG/ML IJ SOLN
25.0000 mg | Freq: Once | INTRAMUSCULAR | Status: AC
  Administered 2014-09-22: 25 mg via INTRAVENOUS
  Filled 2014-09-22: qty 1

## 2014-09-22 MED ORDER — SODIUM CHLORIDE 0.9 % IV BOLUS (SEPSIS)
1000.0000 mL | Freq: Once | INTRAVENOUS | Status: AC
  Administered 2014-09-22: 1000 mL via INTRAVENOUS

## 2014-09-22 NOTE — ED Provider Notes (Signed)
CSN: [id number]     Arrival date & time 09/22/14  1254 History   First MD Initiated Contact with Patient 09/22/14 1332     Chief Complaint  Patient presents with  . Abdominal Pain   Teresa Valenzuela is a 44 y.o. female with a history of chronic pancreatitis, pancreatic cyst, and hypertension who presents to the emergency department complaining of worsening epigastric abdominal pain for the past 3-4 hours associated with some positional lightheadedness today. The patient reports that her abdominal pain feels like a flareup of her chronic pancreatitis, but she came to the ER because the lightheadedness was concerning. The patient reports some nausea without vomiting or diarrhea. The patient reports she takes methadone daily for pain control. Patient reports her pain is currently a 6 out of 10 in her upper abdomen that is worse on her left. The patient denies new abdominal pain that is different from her chronic pancreatitis. The patient reports she has not been hungry and has not been eating or drinking much over the past few days and thinks this might contribute to her lightheadedness. The patient denies fevers, chills, vomiting, diarrhea, dysuria, hematuria, urinary frequency, urinary urgency,   (Consider location/radiation/quality/duration/timing/severity/associated sxs/prior Treatment) HPI  Past Medical History  Diagnosis Date  . Pancreatic cyst   . Pancreatitis   . Ovarian cyst   . Hypertension    Past Surgical History  Procedure Laterality Date  . Cholecystectomy    . Tubal ligation     Family History  Problem Relation Age of Onset  . CAD Neg Hx    History  Substance Use Topics  . Smoking status: Never Smoker   . Smokeless tobacco: Never Used  . Alcohol Use: No   OB History    No data available     Review of Systems  Constitutional: Negative for fever and chills.  HENT: Negative for congestion, ear pain, sore throat and trouble swallowing.   Eyes: Negative for pain and  visual disturbance.  Respiratory: Negative for cough, shortness of breath and wheezing.   Cardiovascular: Negative for chest pain, palpitations and leg swelling.  Gastrointestinal: Positive for nausea and abdominal pain. Negative for vomiting, diarrhea and blood in stool.  Genitourinary: Negative for dysuria, frequency, hematuria, flank pain, vaginal bleeding, vaginal discharge, difficulty urinating and vaginal pain.  Musculoskeletal: Negative for back pain, neck pain and neck stiffness.  Skin: Negative for rash and wound.  Neurological: Positive for light-headedness. Negative for dizziness, syncope, weakness, numbness and headaches.  All other systems reviewed and are negative.     Allergies  Zofran and Reglan  Home Medications   Prior to Admission medications   Medication Sig Start Date End Date Taking? Authorizing Provider  methadone (DOLOPHINE) 10 MG tablet Take 20 mg by mouth every 8 (eight) hours.   Yes Historical Provider, MD  aspirin 81 MG tablet Take 81 mg by mouth daily.    Historical Provider, MD  potassium chloride 20 MEQ TBCR Take 10 mEq by mouth daily. 06/25/14   Richarda Overlie, MD  promethazine (PHENERGAN) 12.5 MG tablet Take 1 tablet (12.5 mg total) by mouth every 6 (six) hours as needed for nausea or vomiting. 09/22/14   Einar Gip Zalia Hautala, PA-C   BP 137/89 mmHg  Pulse 72  Temp(Src) 97.7 F (36.5 C) (Oral)  Resp 20  Ht  (1.676 m)  Wt 245 lb (111.131 kg)  BMI 39.56 kg/m2  SpO2 99% Physical Exam  Constitutional: She is oriented to person, place, and time.  She appears well-developed and well-nourished. No distress.  HENT:  Head: Normocephalic and atraumatic.  Right Ear: External ear normal.  Left Ear: External ear normal.  Nose: Nose normal.  Mouth/Throat: Oropharynx is clear and moist. No oropharyngeal exudate.  Eyes: Conjunctivae are normal. Pupils are equal, round, and reactive to light. Right eye exhibits no discharge. Left eye exhibits no discharge.   Neck: Neck supple.  Cardiovascular: Normal rate, regular rhythm, normal heart sounds and intact distal pulses.  Exam reveals no gallop and no friction rub.   No murmur heard. Bilateral radial pulses are intact.  Pulmonary/Chest: Effort normal and breath sounds normal. No respiratory distress. She has no wheezes. She has no rales. She exhibits no tenderness.  Abdominal: Soft. Bowel sounds are normal. She exhibits no distension and no mass. There is tenderness. There is no rebound and no guarding.  Abdomen soft. Bowel sounds are present. Patient is moderate epigastric and left upper quadrant abdominal tenderness to palpation. No right lower quadrant tenderness to palpation. Negative Rovsing sign. Negative psoas and obturator sign.   Musculoskeletal: Normal range of motion. She exhibits no edema.  The patient is able to ambulate in the room without difficulty or assistance. Patient denies feeling lightheaded when standing.  Lymphadenopathy:    She has no cervical adenopathy.  Neurological: She is alert and oriented to person, place, and time. No cranial nerve deficit. Coordination normal.  Skin: Skin is warm and dry. No rash noted. She is not diaphoretic. No erythema. No pallor.  Psychiatric: She has a normal mood and affect. Her behavior is normal.  Nursing note and vitals reviewed.   ED Course  Procedures (including critical care time) Labs Review Labs Reviewed  URINALYSIS, ROUTINE W REFLEX MICROSCOPIC - Abnormal; Notable for the following:    Leukocytes, UA SMALL (*)    All other components within normal limits  URINE MICROSCOPIC-ADD ON - Abnormal; Notable for the following:    Bacteria, UA FEW (*)    All other components within normal limits  CBC WITH DIFFERENTIAL - Abnormal; Notable for the following:    Hemoglobin 11.3 (*)    HCT 35.3 (*)    All other components within normal limits  COMPREHENSIVE METABOLIC PANEL - Abnormal; Notable for the following:    Glucose, Bld 111 (*)     Calcium 8.1 (*)    All other components within normal limits  PREGNANCY, URINE  LIPASE, BLOOD    Imaging Review No results found.   EKG Interpretation   Date/Time:  Monday September 22 2014 14:12:52 EST Ventricular Rate:  70 PR Interval:  154 QRS Duration: 94 QT Interval:  436 QTC Calculation: 470 R Axis:   55 Text Interpretation:  Normal sinus rhythm Normal ECG No significant change  since last tracing Confirmed by [facility]  MD, WHITNEY ([id number]) on 09/22/2014  2:15:35 PM      Filed Vitals:   09/22/14 1302 09/22/14 1526 09/22/14 1702  BP: 165/96 134/111 137/89  Pulse: 80 58 72  Temp: 97.7 F (36.5 C)    TempSrc: Oral    Resp: Height:  (1.676 m)    Weight: 245 lb (111.131 kg)    SpO2: 99% 99% 99%     MDM   Meds given in ED:  Medications  sodium chloride 0.9 % bolus 1,000 mL (0 mLs Intravenous Stopped 09/22/14 1657)  promethazine (PHENERGAN) injection 25 mg (25 mg Intravenous Given 09/22/14 1413)  HYDROmorphone (DILAUDID) injection 1 mg (1 mg Intravenous Given  09/22/14 1501)    Discharge Medication List as of 09/22/2014  4:46 PM      Final diagnoses:  Nausea  Chronic pancreatitis, unspecified pancreatitis type   This is a 44 year old female with a history of chronic pancreatitis, and pain. Axis II presents to emergency department complaining of worsening epigastric pain as well as some positional lightheadedness since this morning. The patient reports her abdominal pain feels like a flareup of her chronic pancreatitis. Patient reports his lightheadedness that was concerning and when she came to the emergency department today. The patient reports she's not been eating and drinking well recently due to not being hungry, and believes this might be contributing to her lightheadedness. Patient is afebrile and nontoxic appearing. The patient has epigastric and left upper quadrant abdominal tenderness to palpation. Patient's CMP and CBC are unremarkable. The  patient's lipase is 36. Patient's urinalysis is unremarkable. The patient has a negative urine pregnancy test. The patient received a fluid bolus, Phenergan and Dilaudid for pain control. At reevaluation the patient reports feeling much better. She reports her pain is decreased and her nausea has resolved. The patient is able to ambulate in the room without difficulty or assistance. The patient denies feeling lightheaded with standing. I feel there is no need for imaging of her abdomen at this time and the patient's pain is due to her chronic pancreatitis. Patient feels ready to be discharged at this time. The patient is tolerating Sprite prior to discharge. I advised the patient to follow-up with their primary care provider this week. I advised the patient to return to the emergency department with new or worsening symptoms or new concerns. The patient verbalized understanding and agreement with plan.   This patient was discussed with Dr. Anitra LauthPlunkett who agrees with assessment and plan.    Lawana ChambersWilliam Duncan Samil Mecham, PA-C 09/22/14 1815  Gwyneth SproutWhitney Plunkett, MD 09/23/14 [phone]

## 2014-09-22 NOTE — ED Notes (Signed)
C/o abd pain x 3-4 hours-woke up dizzy this am-nasuea-denies v/d

## 2014-09-22 NOTE — Discharge Instructions (Signed)
Nausea, Adult Nausea is the feeling that you have an upset stomach or have to vomit. Nausea by itself is not likely a serious concern, but it may be an early sign of more serious medical problems. As nausea gets worse, it can lead to vomiting. If vomiting develops, there is the risk of dehydration.  CAUSES   Viral infections.  Food poisoning.  Medicines.  Pregnancy.  Motion sickness.  Migraine headaches.  Emotional distress.  Severe pain from any source.  Alcohol intoxication. HOME CARE INSTRUCTIONS  Get plenty of rest.  Ask your caregiver about specific rehydration instructions.  Eat small amounts of food and sip liquids more often.  Take all medicines as told by your caregiver. SEEK MEDICAL CARE IF:  You have not improved after 2 days, or you get worse.  You have a headache. SEEK IMMEDIATE MEDICAL CARE IF:   You have a fever.  You faint.  You keep vomiting or have blood in your vomit.  You are extremely weak or dehydrated.  You have dark or bloody stools.  You have severe chest or abdominal pain. MAKE SURE YOU:  Understand these instructions.  Will watch your condition.  Will get help right away if you are not doing well or get worse. Document Released: 10/06/2004 Document Revised: 05/23/2012 Document Reviewed: 05/11/2011 [facility] Patient Information 2015 [place], [place]. This information is not intended to replace advice given to you by your health care provider. Make sure you discuss any questions you have with your health care provider. Low-Fat Diet for Pancreatitis or Gallbladder Conditions A low-fat diet can be helpful if you have pancreatitis or a gallbladder condition. With these conditions, your pancreas and gallbladder have trouble digesting fats. A healthy eating plan with less fat will help rest your pancreas and gallbladder and reduce your symptoms. WHAT DO I NEED TO KNOW ABOUT THIS DIET?  Eat a low-fat diet.  Reduce your fat intake to less  than 20-30% of your total daily calories. This is less than 50-60 g of fat per day.  Remember that you need some fat in your diet. Ask your dietician what your daily goal should be.  Choose nonfat and low-fat healthy foods. Look for the words "nonfat," "low fat," or "fat free."  As a guide, look on the label and choose foods with less than 3 g of fat per serving. Eat only one serving.  Avoid alcohol.  Do not smoke. If you need help quitting, talk with your health care provider.  Eat small frequent meals instead of three large heavy meals. WHAT FOODS CAN I EAT? Grains Include healthy grains and starches such as potatoes, wheat bread, fiber-rich cereal, and brown rice. Choose whole grain options whenever possible. In adults, whole grains should account for 45-65% of your daily calories.  Fruits and Vegetables Eat plenty of fruits and vegetables. Fresh fruits and vegetables add fiber to your diet. Meats and Other Protein Sources Eat lean meat such as chicken and pork. Trim any fat off of meat before cooking it. Eggs, fish, and beans are other sources of protein. In adults, these foods should account for 10-35% of your daily calories. Dairy Choose low-fat milk and dairy options. Dairy includes fat and protein, as well as calcium.  Fats and Oils Limit high-fat foods such as fried foods, sweets, baked goods, sugary drinks.  Other Creamy sauces and condiments, such as mayonnaise, can add extra fat. Think about whether or not you need to use them, or use smaller amounts or low  fat options. WHAT FOODS ARE NOT RECOMMENDED?  High fat foods, such as:  [employer].  Ice cream.  [place] toast.  Sweet rolls.  Pizza.  Cheese bread.  Foods covered with batter, butter, creamy sauces, or cheese.  Fried foods.  Sugary drinks and desserts.  Foods that cause gas or bloating Document Released: 09/03/2013 Document Reviewed: 09/03/2013 [facility] Patient Information 2015 [place], [place]. This  information is not intended to replace advice given to you by your health care provider. Make sure you discuss any questions you have with your health care provider.

## 2014-09-22 NOTE — ED Notes (Signed)
Per lab, CMP and Lipase are hemolyzed and need a recollect.

## 2015-01-13 ENCOUNTER — Encounter (HOSPITAL_BASED_OUTPATIENT_CLINIC_OR_DEPARTMENT_OTHER): Payer: Self-pay | Admitting: Emergency Medicine

## 2015-01-13 ENCOUNTER — Emergency Department (HOSPITAL_BASED_OUTPATIENT_CLINIC_OR_DEPARTMENT_OTHER): Payer: Self-pay

## 2015-01-13 ENCOUNTER — Emergency Department (HOSPITAL_BASED_OUTPATIENT_CLINIC_OR_DEPARTMENT_OTHER)
Admission: EM | Admit: 2015-01-13 | Discharge: 2015-01-13 | Disposition: A | Discharge status: Home / Self Care | Payer: Self-pay | Attending: Emergency Medicine | Admitting: Emergency Medicine

## 2015-01-13 DIAGNOSIS — R059 Cough, unspecified: Secondary | ICD-10-CM

## 2015-01-13 DIAGNOSIS — Z7982 Long term (current) use of aspirin: Secondary | ICD-10-CM | POA: Insufficient documentation

## 2015-01-13 DIAGNOSIS — R0789 Other chest pain: Secondary | ICD-10-CM | POA: Insufficient documentation

## 2015-01-13 DIAGNOSIS — Z8742 Personal history of other diseases of the female genital tract: Secondary | ICD-10-CM | POA: Insufficient documentation

## 2015-01-13 DIAGNOSIS — K0889 Other specified disorders of teeth and supporting structures: Secondary | ICD-10-CM

## 2015-01-13 DIAGNOSIS — I10 Essential (primary) hypertension: Secondary | ICD-10-CM | POA: Insufficient documentation

## 2015-01-13 DIAGNOSIS — K088 Other specified disorders of teeth and supporting structures: Secondary | ICD-10-CM | POA: Insufficient documentation

## 2015-01-13 DIAGNOSIS — Z79899 Other long term (current) drug therapy: Secondary | ICD-10-CM | POA: Insufficient documentation

## 2015-01-13 DIAGNOSIS — R05 Cough: Secondary | ICD-10-CM

## 2015-01-13 DIAGNOSIS — J019 Acute sinusitis, unspecified: Secondary | ICD-10-CM | POA: Insufficient documentation

## 2015-01-13 DIAGNOSIS — K029 Dental caries, unspecified: Secondary | ICD-10-CM | POA: Insufficient documentation

## 2015-01-13 MED ORDER — AMOXICILLIN-POT CLAVULANATE 875-125 MG PO TABS: 1.0000 | ORAL_TABLET | Freq: Two times a day (BID) | ORAL | Status: DC

## 2015-01-13 NOTE — ED Provider Notes (Signed)
CSN: [id number]     Arrival date & time 01/13/15  1342 History   First MD Initiated Contact with Patient 01/13/15 1413     Chief Complaint  Patient presents with  . URI     (Consider location/radiation/quality/duration/timing/severity/associated sxs/prior Treatment) Patient is a 44 y.o. female presenting with URI.  URI Presenting symptoms: congestion and facial pain   Severity:  Moderate Onset quality:  Gradual Duration:  3 weeks Timing:  Constant Progression:  Worsening Chronicity:  New Relieved by: advil cold and sinus. Worsened by:  Nothing tried Associated symptoms comment:  Cough, chest pain   Past Medical History  Diagnosis Date  . Pancreatic cyst   . Pancreatitis   . Ovarian cyst   . Hypertension    Past Surgical History  Procedure Laterality Date  . Cholecystectomy    . Tubal ligation     Family History  Problem Relation Age of Onset  . CAD Neg Hx    History  Substance Use Topics  . Smoking status: Never Smoker   . Smokeless tobacco: Never Used  . Alcohol Use: No   OB History    No data available     Review of Systems  HENT: Positive for congestion.   All other systems reviewed and are negative.     Allergies  Zofran and Reglan  Home Medications   Prior to Admission medications   Medication Sig Start Date End Date Taking? Authorizing Provider  aspirin 81 MG tablet Take 81 mg by mouth daily.   Yes Historical Provider, MD  methadone (DOLOPHINE) 10 MG tablet Take 20 mg by mouth every 8 (eight) hours.   Yes Historical Provider, MD  promethazine (PHENERGAN) 12.5 MG tablet Take 1 tablet (12.5 mg total) by mouth every 6 (six) hours as needed for nausea or vomiting. 09/22/14  Yes Everlene FarrierWilliam Dansie, PA-C  potassium chloride 20 MEQ TBCR Take 10 mEq by mouth daily. 06/25/14   Richarda OverlieNayana Abrol, MD   BP 191/91 mmHg  Pulse 80  Temp(Src) 98.4 F (36.9 C) (Oral)  Resp 20  Ht 5\' 7"  (1.702 m)  Wt 250 lb (113.399 kg)  BMI 39.15 kg/m2  SpO2 97%  LMP  12/30/2014 Physical Exam  Constitutional: She is oriented to person, place, and time. She appears well-developed and well-nourished. No distress.  HENT:  Head: Normocephalic and atraumatic.  Nose: Mucosal edema present. Right sinus exhibits maxillary sinus tenderness and frontal sinus tenderness. Left sinus exhibits maxillary sinus tenderness and frontal sinus tenderness.  Mouth/Throat: No trismus in the jaw. Dental caries present. No uvula swelling. Posterior oropharyngeal erythema present. No posterior oropharyngeal edema or tonsillar abscesses.    Eyes: Conjunctivae are normal. Pupils are equal, round, and reactive to light. No scleral icterus.  Neck: Neck supple.  Cardiovascular: Normal rate, regular rhythm, normal heart sounds and intact distal pulses.   No murmur heard. Pulmonary/Chest: Effort normal and breath sounds normal. No stridor. No respiratory distress. She has no wheezes. She has no rales. She exhibits tenderness (anterior chest wall).  Abdominal: Soft. Bowel sounds are normal. She exhibits no distension. There is no tenderness.  Musculoskeletal: Normal range of motion.  Neurological: She is alert and oriented to person, place, and time.  Skin: Skin is warm and dry. No rash noted.  Psychiatric: She has a normal mood and affect. Her behavior is normal.  Nursing note and vitals reviewed.   ED Course  Procedures (including critical care time) Labs Review Labs Reviewed - No data to display  Imaging  Review Dg Chest 2 View (if Patient Has Fever And/or Copd)  01/13/2015   CLINICAL DATA:  Upper respiratory infection, ear pain  EXAM: CHEST  2 VIEW  COMPARISON:  06/25/2014  FINDINGS: Cardiomediastinal silhouette is stable. Mild elevation of the right hemidiaphragm again noted. No acute infiltrate or pulmonary edema. Bony thorax is unremarkable.  IMPRESSION: No active cardiopulmonary disease.   Electronically Signed   By: Natasha Mead M.D.   On: 01/13/2015 14:07  All radiology studies  independently viewed by me.      EKG Interpretation None      MDM   Final diagnoses:  Acute sinusitis, recurrence not specified, unspecified location  Pain, dental  Cough    44 yo female with URI symptoms, sinus pressure, cough, chest pain, and left lower dental pain.  Sinus pressure present for 3 weeks, cough and chest pain started yesterday.  On exam, chest very tender to palpation, reproducing pain.  In setting of cough, suspect MSK chest wall pain.  Plan to treat sinusitis with Augmentin due to duration of illness.  This will also help potential dental abscess.  Return precautions given.      Blake Divine, MD 01/13/15 1538

## 2015-01-13 NOTE — ED Notes (Signed)
44 yo c/o cold symptoms with ear pain, chest congestion and chills. Taking Advil Cold and Sinus.

## 2015-01-13 NOTE — Discharge Instructions (Signed)
Sinusitis °Sinusitis is redness, soreness, and inflammation of the paranasal sinuses. Paranasal sinuses are air pockets within the bones of your face (beneath the eyes, the middle of the forehead, or above the eyes). In healthy paranasal sinuses, mucus is able to drain out, and air is able to circulate through them by way of your nose. However, when your paranasal sinuses are inflamed, mucus and air can become trapped. This can allow bacteria and other germs to grow and cause infection. °Sinusitis can develop quickly and last only a short time (acute) or continue over a long period (chronic). Sinusitis that lasts for more than 12 weeks is considered chronic.  °CAUSES  °Causes of sinusitis include: °· Allergies. °· Structural abnormalities, such as displacement of the cartilage that separates your nostrils (deviated septum), which can decrease the air flow through your nose and sinuses and affect sinus drainage. °· Functional abnormalities, such as when the small hairs (cilia) that line your sinuses and help remove mucus do not work properly or are not present. °SIGNS AND SYMPTOMS  °Symptoms of acute and chronic sinusitis are the same. The primary symptoms are pain and pressure around the affected sinuses. Other symptoms include: °· Upper toothache. °· Earache. °· Headache. °· Bad breath. °· Decreased sense of smell and taste. °· A cough, which worsens when you are lying flat. °· Fatigue. °· Fever. °· Thick drainage from your nose, which often is green and may contain pus (purulent). °· Swelling and warmth over the affected sinuses. °DIAGNOSIS  °Your health care provider will perform a physical exam. During the exam, your health care provider may: °· Look in your nose for signs of abnormal growths in your nostrils (nasal polyps). °· Tap over the affected sinus to check for signs of infection. °· View the inside of your sinuses (endoscopy) using an imaging device that has a light attached (endoscope). °If your health  care provider suspects that you have chronic sinusitis, one or more of the following tests may be recommended: °· Allergy tests. °· Nasal culture. A sample of mucus is taken from your nose, sent to a lab, and screened for bacteria. °· Nasal cytology. A sample of mucus is taken from your nose and examined by your health care provider to determine if your sinusitis is related to an allergy. °TREATMENT  °Most cases of acute sinusitis are related to a viral infection and will resolve on their own within 10 days. Sometimes medicines are prescribed to help relieve symptoms (pain medicine, decongestants, nasal steroid sprays, or saline sprays).  °However, for sinusitis related to a bacterial infection, your health care provider will prescribe antibiotic medicines. These are medicines that will help kill the bacteria causing the infection.  °Rarely, sinusitis is caused by a fungal infection. In theses cases, your health care provider will prescribe antifungal medicine. °For some cases of chronic sinusitis, surgery is needed. Generally, these are cases in which sinusitis recurs more than 3 times per year, despite other treatments. °HOME CARE INSTRUCTIONS  °· Drink plenty of water. Water helps thin the mucus so your sinuses can drain more easily. °· Use a humidifier. °· Inhale steam 3 to 4 times a day (for example, sit in the bathroom with the shower running). °· Apply a warm, moist washcloth to your face 3 to 4 times a day, or as directed by your health care provider. °· Use saline nasal sprays to help moisten and clean your sinuses. °· Take medicines only as directed by your health care provider. °·   If you were prescribed either an antibiotic or antifungal medicine, finish it all even if you start to feel better. SEEK IMMEDIATE MEDICAL CARE IF:  You have increasing pain or severe headaches.  You have nausea, vomiting, or drowsiness.  You have swelling around your face.  You have vision problems.  You have a stiff  neck.  You have difficulty breathing. MAKE SURE YOU:   Understand these instructions.  Will watch your condition.  Will get help right away if you are not doing well or get worse. Document Released: 08/29/2005 Document Revised: 01/13/2014 Document Reviewed: 09/13/2011 [facility] Patient Information 2015 [place], [place]. This information is not intended to replace advice given to you by your health care provider. Make sure you discuss any questions you have with your health care provider.  Dental Pain A tooth ache may be caused by cavities (tooth decay). Cavities expose the nerve of the tooth to air and hot or cold temperatures. It may come from an infection or abscess (also called a boil or furuncle) around your tooth. It is also often caused by dental caries (tooth decay). This causes the pain you are having. DIAGNOSIS  Your caregiver can diagnose this problem by exam. TREATMENT   If caused by an infection, it may be treated with medications which kill germs (antibiotics) and pain medications as prescribed by your caregiver. Take medications as directed.  Only take over-the-counter or prescription medicines for pain, discomfort, or fever as directed by your caregiver.  Whether the tooth ache today is caused by infection or dental disease, you should see your dentist as soon as possible for further care. SEEK MEDICAL CARE IF: The exam and treatment you received today has been provided on an emergency basis only. This is not a substitute for complete medical or dental care. If your problem worsens or new problems (symptoms) appear, and you are unable to meet with your dentist, call or return to this location. SEEK IMMEDIATE MEDICAL CARE IF:   You have a fever.  You develop redness and swelling of your face, jaw, or neck.  You are unable to open your mouth.  You have severe pain uncontrolled by pain medicine. MAKE SURE YOU:   Understand these instructions.  Will watch your  condition.  Will get help right away if you are not doing well or get worse. Document Released: 08/29/2005 Document Revised: 11/21/2011 Document Reviewed: 04/16/2008 [facility] CentersExit[facility] Patient Information 2015 [place], [place]. This information is not intended to replace advice given to you by your health care provider. Make sure you discuss any questions you have with your health care provider.

## 2015-06-11 ENCOUNTER — Encounter (HOSPITAL_BASED_OUTPATIENT_CLINIC_OR_DEPARTMENT_OTHER): Payer: Self-pay | Admitting: Emergency Medicine

## 2015-06-11 ENCOUNTER — Emergency Department (HOSPITAL_BASED_OUTPATIENT_CLINIC_OR_DEPARTMENT_OTHER)
Admission: EM | Admit: 2015-06-11 | Discharge: 2015-06-11 | Disposition: A | Discharge status: Home / Self Care | Payer: Self-pay | Attending: Emergency Medicine | Admitting: Emergency Medicine

## 2015-06-11 DIAGNOSIS — R1012 Left upper quadrant pain: Secondary | ICD-10-CM | POA: Insufficient documentation

## 2015-06-11 DIAGNOSIS — Z8719 Personal history of other diseases of the digestive system: Secondary | ICD-10-CM | POA: Insufficient documentation

## 2015-06-11 DIAGNOSIS — Z7982 Long term (current) use of aspirin: Secondary | ICD-10-CM | POA: Insufficient documentation

## 2015-06-11 DIAGNOSIS — I10 Essential (primary) hypertension: Secondary | ICD-10-CM | POA: Insufficient documentation

## 2015-06-11 DIAGNOSIS — R079 Chest pain, unspecified: Secondary | ICD-10-CM | POA: Insufficient documentation

## 2015-06-11 DIAGNOSIS — Z8742 Personal history of other diseases of the female genital tract: Secondary | ICD-10-CM | POA: Insufficient documentation

## 2015-06-11 DIAGNOSIS — R42 Dizziness and giddiness: Secondary | ICD-10-CM | POA: Insufficient documentation

## 2015-06-11 DIAGNOSIS — Z3202 Encounter for pregnancy test, result negative: Secondary | ICD-10-CM | POA: Insufficient documentation

## 2015-06-11 DIAGNOSIS — R112 Nausea with vomiting, unspecified: Secondary | ICD-10-CM | POA: Insufficient documentation

## 2015-06-11 DIAGNOSIS — R5383 Other fatigue: Secondary | ICD-10-CM | POA: Insufficient documentation

## 2015-06-11 LAB — URINALYSIS, ROUTINE W REFLEX MICROSCOPIC
BILIRUBIN URINE: NEGATIVE
Glucose, UA: NEGATIVE mg/dL
HGB URINE DIPSTICK: NEGATIVE
KETONES UR: NEGATIVE mg/dL
Leukocytes, UA: NEGATIVE
Nitrite: NEGATIVE
Protein, ur: NEGATIVE mg/dL
SPECIFIC GRAVITY, URINE: 1.016 (ref 1.005–1.030)
UROBILINOGEN UA: 1 mg/dL (ref 0.0–1.0)
pH: 7.5 (ref 5.0–8.0)

## 2015-06-11 LAB — CBC WITH DIFFERENTIAL/PLATELET
BASOS PCT: 0 %
Basophils Absolute: 0 10*3/uL (ref 0.0–0.1)
EOS PCT: 2 %
Eosinophils Absolute: 0.2 10*3/uL (ref 0.0–0.7)
HEMATOCRIT: 35.7 % — AB (ref 36.0–46.0)
Hemoglobin: 11.3 g/dL — ABNORMAL LOW (ref 12.0–15.0)
Lymphocytes Relative: 20 %
Lymphs Abs: 2.1 10*3/uL (ref 0.7–4.0)
MCH: 27.9 pg (ref 26.0–34.0)
MCHC: 31.7 g/dL (ref 30.0–36.0)
MCV: 88.1 fL (ref 78.0–100.0)
MONO ABS: 0.9 10*3/uL (ref 0.1–1.0)
Monocytes Relative: 8 %
NEUTROS ABS: 7.3 10*3/uL (ref 1.7–7.7)
Neutrophils Relative %: 70 %
Platelets: 275 10*3/uL (ref 150–400)
RBC: 4.05 MIL/uL (ref 3.87–5.11)
RDW: 12.9 % (ref 11.5–15.5)
WBC: 10.5 10*3/uL (ref 4.0–10.5)

## 2015-06-11 LAB — COMPREHENSIVE METABOLIC PANEL
ALBUMIN: 3.6 g/dL (ref 3.5–5.0)
ALK PHOS: 76 U/L (ref 38–126)
ALT: 19 U/L (ref 14–54)
ANION GAP: 7 (ref 5–15)
AST: 21 U/L (ref 15–41)
BUN: 10 mg/dL (ref 6–20)
CALCIUM: 8.4 mg/dL — AB (ref 8.9–10.3)
CO2: 29 mmol/L (ref 22–32)
CREATININE: 0.6 mg/dL (ref 0.44–1.00)
Chloride: 100 mmol/L — ABNORMAL LOW (ref 101–111)
GFR calc Af Amer: >60 mL/min (ref >60)
GFR calc non Af Amer: >60 mL/min (ref >60)
GLUCOSE: 113 mg/dL — AB (ref 65–99)
Potassium: 3.5 mmol/L (ref 3.5–5.1)
SODIUM: 136 mmol/L (ref 135–145)
Total Bilirubin: 0.4 mg/dL (ref 0.3–1.2)
Total Protein: 7.1 g/dL (ref 6.5–8.1)

## 2015-06-11 LAB — PREGNANCY, URINE: Preg Test, Ur: NEGATIVE

## 2015-06-11 LAB — LIPASE, BLOOD: Lipase: 31 U/L (ref 22–51)

## 2015-06-11 MED ORDER — SODIUM CHLORIDE 0.9 % IV BOLUS (SEPSIS)
500.0000 mL | Freq: Once | INTRAVENOUS | Status: AC
  Administered 2015-06-11: 500 mL via INTRAVENOUS

## 2015-06-11 MED ORDER — PROMETHAZINE HCL 25 MG/ML IJ SOLN
12.5000 mg | Freq: Once | INTRAMUSCULAR | Status: AC
  Administered 2015-06-11: 12.5 mg via INTRAVENOUS
  Filled 2015-06-11: qty 1

## 2015-06-11 MED ORDER — PROMETHAZINE HCL 25 MG PO TABS: 25.0000 mg | ORAL_TABLET | Freq: Four times a day (QID) | ORAL | Status: DC | PRN

## 2015-06-11 NOTE — ED Provider Notes (Addendum)
CSN: [id number]     Arrival date & time 06/11/15  1447 History   First MD Initiated Contact with Patient 06/11/15 1511     Chief Complaint  Patient presents with  . Abdominal Pain     (Consider location/radiation/quality/duration/timing/severity/associated sxs/prior Treatment) HPI Comments: Patient with a history of hypertension pancreatitis and pancreatic cyst presents with not feeling right. She states that she's been feeling jittery over the last week. She's had some nausea and today he had 2 episodes of vomiting. She's had some increased pain across her upper abdomen. She does have a history of pancreatitis and states that this is her typical pain. She states that over the last 2 days it's helpful but worse than it normally does on a day-to-day basis. She's had some intermittent pains in her chest. It's more on the left side of her chest. The pains last only a second or 2 and then go away. She's had 3 episodes over the last week. She denies any shortness of breath or persistent chest pain. There is no exertional symptoms. She denies any fevers or chills. She has some pressure on urination but no burning or urinary frequency. She denies any vaginal bleeding or discharge. She denies any blood in her emesis or stool.  Patient is a 44 y.o. female presenting with abdominal pain.  Abdominal Pain Associated symptoms: chest pain, fatigue, nausea and vomiting   Associated symptoms: no chills, no cough, no diarrhea, no fever, no hematuria and no shortness of breath     Past Medical History  Diagnosis Date  . Pancreatic cyst   . Pancreatitis   . Ovarian cyst   . Hypertension    Past Surgical History  Procedure Laterality Date  . Cholecystectomy    . Tubal ligation     Family History  Problem Relation Age of Onset  . CAD Neg Hx    Social History  Substance Use Topics  . Smoking status: Never Smoker   . Smokeless tobacco: Never Used  . Alcohol Use: No   OB History    No data available      Review of Systems  Constitutional: Positive for fatigue. Negative for fever, chills and diaphoresis.  HENT: Negative for congestion, rhinorrhea and sneezing.   Eyes: Negative.   Respiratory: Negative for cough, chest tightness and shortness of breath.   Cardiovascular: Positive for chest pain. Negative for leg swelling.  Gastrointestinal: Positive for nausea, vomiting and abdominal pain. Negative for diarrhea and blood in stool.  Genitourinary: Negative for frequency, hematuria, flank pain and difficulty urinating.  Musculoskeletal: Negative for back pain and arthralgias.  Skin: Negative for rash.  Neurological: Positive for light-headedness. Negative for dizziness, speech difficulty, weakness, numbness and headaches.      Allergies  Zofran and Reglan  Home Medications   Prior to Admission medications   Medication Sig Start Date End Date Taking? Authorizing Provider  aspirin 81 MG tablet Take 81 mg by mouth daily.   Yes Historical Provider, MD  methadone (DOLOPHINE) 10 MG tablet Take 20 mg by mouth every 8 (eight) hours.   Yes Historical Provider, MD  promethazine (PHENERGAN) 25 MG tablet Take 1 tablet (25 mg total) by mouth every 6 (six) hours as needed for nausea or vomiting. 06/11/15   Rolan Bucco, MD   BP 158/97 mmHg  Pulse 86  Temp(Src) 98.6 F (37 C)  Resp 22  Ht  (1.727 m)  Wt 250 lb (113.399 kg)  BMI 38.02 kg/m2  SpO2 100%  LMP 05/14/2015 Physical Exam  Constitutional: She is oriented to person, place, and time. She appears well-developed and well-nourished.  HENT:  Head: Normocephalic and atraumatic.  Eyes: Pupils are equal, round, and reactive to light.  Neck: Normal range of motion. Neck supple.  Cardiovascular: Normal rate, regular rhythm and normal heart sounds.   Pulmonary/Chest: Effort normal and breath sounds normal. No respiratory distress. She has no wheezes. She has no rales. She exhibits no tenderness.  Abdominal: Soft. Bowel sounds are  normal. There is tenderness (mmoderate tenderness to the epigastrium and left upper quadrant). There is no rebound and no guarding.  Musculoskeletal: Normal range of motion. She exhibits no edema.  Lymphadenopathy:    She has no cervical adenopathy.  Neurological: She is alert and oriented to person, place, and time.  Skin: Skin is warm and dry. No rash noted.  Psychiatric: She has a normal mood and affect.    ED Course  Procedures (including critical care time) Labs Review Results for orders placed or performed during the hospital encounter of 06/11/15  Urinalysis, Routine w reflex microscopic (not at [facility])  Result Value Ref Range   Color, Urine YELLOW YELLOW   APPearance CLOUDY (A) CLEAR   Specific Gravity, Urine 1.016 1.005 - 1.030   pH 7.5 5.0 - 8.0   Glucose, UA NEGATIVE NEGATIVE mg/dL   Hgb urine dipstick NEGATIVE NEGATIVE   Bilirubin Urine NEGATIVE NEGATIVE   Ketones, ur NEGATIVE NEGATIVE mg/dL   Protein, ur NEGATIVE NEGATIVE mg/dL   Urobilinogen, UA 1.0 0.0 - 1.0 mg/dL   Nitrite NEGATIVE NEGATIVE   Leukocytes, UA NEGATIVE NEGATIVE  Pregnancy, urine  Result Value Ref Range   Preg Test, Ur NEGATIVE NEGATIVE  Comprehensive metabolic panel  Result Value Ref Range   Sodium 136 135 - 145 mmol/L   Potassium 3.5 3.5 - 5.1 mmol/L   Chloride 100 (L) 101 - 111 mmol/L   CO2 29 22 - 32 mmol/L   Glucose, Bld 113 (H) 65 - 99 mg/dL   BUN 10 6 - 20 mg/dL   Creatinine, Ser 6.96 0.44 - 1.00 mg/dL   Calcium 8.4 (L) 8.9 - 10.3 mg/dL   Total Protein 7.1 6.5 - 8.1 g/dL   Albumin 3.6 3.5 - 5.0 g/dL   AST 21 15 - 41 U/L   ALT 19 14 - 54 U/L   Alkaline Phosphatase 76 38 - 126 U/L   Total Bilirubin 0.4 0.3 - 1.2 mg/dL   GFR calc non Af Amer >60 >60 mL/min   GFR calc Af Amer >60 >60 mL/min   Anion gap 7 5 - 15  CBC with Differential  Result Value Ref Range   WBC 10.5 4.0 - 10.5 K/uL   RBC 4.05 3.87 - 5.11 MIL/uL   Hemoglobin 11.3 (L) 12.0 - 15.0 g/dL   HCT 29.5 (L) 28.4 - 13.2 %    MCV 88.1 78.0 - 100.0 fL   MCH 27.9 26.0 - 34.0 pg   MCHC 31.7 30.0 - 36.0 g/dL   RDW 44.0 10.2 - 72.5 %   Platelets 275 150 - 400 K/uL   Neutrophils Relative % 70 %   Neutro Abs 7.3 1.7 - 7.7 K/uL   Lymphocytes Relative 20 %   Lymphs Abs 2.1 0.7 - 4.0 K/uL   Monocytes Relative 8 %   Monocytes Absolute 0.9 0.1 - 1.0 K/uL   Eosinophils Relative 2 %   Eosinophils Absolute 0.2 0.0 - 0.7 K/uL   Basophils Relative 0 %  Basophils Absolute 0.0 0.0 - 0.1 K/uL  Lipase, blood  Result Value Ref Range   Lipase 31 22 - 51 U/L   No results found.    Imaging Review No results found. I have personally reviewed and evaluated these images and lab results as part of my medical decision-making.   EKG Interpretation   Date/Time:  Thursday June 11 2015 14:57:25 EDT Ventricular Rate:  84 PR Interval:  150 QRS Duration: 100 QT Interval:  418 QTC Calculation: 493 R Axis:   74 Text Interpretation:  Normal sinus rhythm Prolonged QT Abnormal ECG since  last tracing no significant change Confirmed by BELFI  MD, MELANIE ([id number])  on 06/11/2015 3:24:52 PM      MDM   Final diagnoses:  Left upper quadrant pain    Patient presents with upper abdominal pain. She feels that it's similar to her chronic pancreatitis type pain although it's a little bit worse. Her lipase is normal without evidence of acute pancreatitis. She is nontoxic appearing. She has no ongoing vomiting. She does not have symptoms that would be more suggestive of a bowel obstruction. Her labs are reassuring. Her symptoms do not sound consistent with acute coronary syndrome. She has only fleeting chest pain lasting 1-2 seconds, no assoicated symptoms, no exertional symptoms.  She was discharged home in good condition. She was given outpatient resources for follow-up.    Rolan Bucco, MD 06/11/15 [id number]  Rolan Bucco, MD 06/11/15 [phone]

## 2015-06-11 NOTE — ED Notes (Signed)
Pt reports that she has not felt right all week, drover herself to er. Reports n/v started yesterday and this am she awoke with chest pain that radiated to left arm and neck

## 2015-06-11 NOTE — Discharge Instructions (Signed)
Abdominal Pain Many things can cause abdominal pain. Usually, abdominal pain is not caused by a disease and will improve without treatment. It can often be observed and treated at home. Your health care provider will do a physical exam and possibly order blood tests and X-rays to help determine the seriousness of your pain. However, in many cases, more time must pass before a clear cause of the pain can be found. Before that point, your health care provider may not know if you need more testing or further treatment. HOME CARE INSTRUCTIONS  Monitor your abdominal pain for any changes. The following actions may help to alleviate any discomfort you are experiencing:  Only take over-the-counter or prescription medicines as directed by your health care provider.  Do not take laxatives unless directed to do so by your health care provider.  Try a clear liquid diet (broth, tea, or water) as directed by your health care provider. Slowly move to a bland diet as tolerated. SEEK MEDICAL CARE IF:  You have unexplained abdominal pain.  You have abdominal pain associated with nausea or diarrhea.  You have pain when you urinate or have a bowel movement.  You experience abdominal pain that wakes you in the night.  You have abdominal pain that is worsened or improved by eating food.  You have abdominal pain that is worsened with eating fatty foods.  You have a fever. SEEK IMMEDIATE MEDICAL CARE IF:   Your pain does not go away within 2 hours.  You keep throwing up (vomiting).  Your pain is felt only in portions of the abdomen, such as the right side or the left lower portion of the abdomen.  You pass bloody or black tarry stools. MAKE SURE YOU:  Understand these instructions.   Will watch your condition.   Will get help right away if you are not doing well or get worse.  Document Released: 06/08/2005 Document Revised: 09/03/2013 Document Reviewed: 05/08/2013 [facility] Patient Information  2015 [place], [place]. This information is not intended to replace advice given to you by your health care provider. Make sure you discuss any questions you have with your health care provider. Emergency Department Resource Guide 1) Find a Doctor and Pay Out of Pocket Although you won't have to find out who is covered by your insurance plan, it is a good idea to ask around and get recommendations. You will then need to call the office and see if the doctor you have chosen will accept you as a new patient and what types of options they offer for patients who are self-pay. Some doctors offer discounts or will set up payment plans for their patients who do not have insurance, but you will need to ask so you aren't surprised when you get to your appointment.  2) Contact Your Local Health Department Not all health departments have doctors that can see patients for sick visits, but many do, so it is worth a call to see if yours does. If you don't know where your local health department is, you can check in your phone book. The CDC also has a tool to help you locate your state's health department, and many state websites also have listings of all of their local health departments.  3) Find a Walk-in Clinic If your illness is not likely to be very severe or complicated, you may want to try a walk in clinic. These are popping up all over the country in pharmacies, drugstores, and shopping centers. They're usually staffed  by nurse practitioners or physician assistants that have been trained to treat common illnesses and complaints. They're usually fairly quick and inexpensive. However, if you have serious medical issues or chronic medical problems, these are probably not your best option.   Chronic Pain Problems: Organization         Address     Phone             Notes  [facility]  [phone] Patients need to be referred by their primary care doctor.   Medication  Assistance: Organization         Address     Phone             Notes  [facility] Medication [facility] [address] [place] [place], [place] [address] [phone] --Must be a resident of [facility] -- Must have NO insurance coverage whatsoever (no Medicaid/ Medicare, etc.) -- The pt. MUST have a primary care doctor that directs their care regularly and follows them in the community   [facility]  [phone]   [employer]  [phone]    Agencies that provide inexpensive medical care: Organization         Address     Phone             Notes  [facility]  [phone]   [facility]    [phone]   [facility] [address] [place], [place] [address] [phone]   [facility] [address] [place], [place] [phone]   Planned Parenthood    [phone]   [facility]    [phone]   [facility]  [address], [place] Phone:  [phone], Fax:  [phone] Hours of Operation:  9 am - 6 pm, M-F.  Also accepts Medicaid/Medicare and self-pay.  [facility]  [address], [place] Phone: [phone], Fax: [phone]. Hours of Operation:  8:30 am - 5:30 pm, M-F.  Also accepts Medicaid and self-pay.  [facility] [place] [address] [place] Phone: [phone]   [facility]     [address] Natasha Bence [place], [place] [phone], Ext. [phone] Mondays & Thursdays: 7-9 AM.  First 15 patients are seen on a first come, first serve basis.   [facility] [address], [place] [address] [phone] Accepts Medicaid   Medicaid-accepting [facility] Providers:  Organization         Address     Phone             Notes  [facility] [address], [place] [phone] Also accepts self-pay patients.   [facility] [address] Laurell Josephs [place], [place]  [phone]   [facility] [address], [place] [phone]   [facility] [address], [place] [phone]   Renaye Rakers [address], [place]   [phone] Only accepts [place] Access [place] patients after they have their name applied to their card.   Self-Pay (no insurance) in [facility]:  Film/video editor             Notes  Sickle Cell Patients, [facility] [address] [place], [place] [phone]   [facility] Urgent Care [address] [place], [place] [phone]   [facility] [place]  [address], [place] [phone]   [facility]/Dr. Osei-Bonsu  [address], [place] or [address], [place] [phone] Phone number for both [place] and [place] locations is the same.  [facility]gent [facility] [address], [place] [phone]   [facility] [address], [place] or [address] [phone]   [facility] [address] [place] [phone], phone; [phone], fax Sees patients 1st and 3rd Saturday of every month.  Must not qualify for public or private insurance (i.e. Medicaid, Medicare, [place] Health Choice, Veterans' Benefits)  Household income should be no more than 200% of the poverty level The clinic cannot treat you if you are pregnant or think you are pregnant  Sexually transmitted diseases are not treated at the clinic.    Dental Care:  Organization         Address     Phone             Notes  [facility] Department of [facility] [address] [place], [place] [phone] Accepts children up to age 3 who are enrolled in [place] or [place] Health Choice;  pregnant women with a Medicaid card; and children who have applied for Medicaid or [place] Health Choice, but were declined, whose parents can pay a reduced fee at time of service.  [facility] Department of [facility]  [address], [place] [phone] Accepts children up to age 83 who are enrolled in [place] or [place] Health Choice; pregnant women with a Medicaid card; and children who have applied for Medicaid or [place] Health Choice, but were declined, whose parents can pay a reduced fee at time of service.  [facility]  [address] [place], [place] [phone] Patients are seen by appointment only. Walk-ins are not accepted. [facility] will see patients 67 years of age and older. Monday - Tuesday (8am-5pm) Most Wednesdays (8:30-5pm) $30 per visit, cash only  [facility]  [address], [facility] [phone] Patients are seen by appointment only. Walk-ins are not accepted. [facility] will see patients 53 years of age and older. One Wednesday Evening (Monthly: Volunteer Based).  $30 per visit, cash only  [employer]  ([phone] for adults; Children under age 76, call Graduate Pediatric Dentistry at [phone]. Children aged 61-14, please call [phone] to request a pediatric application.  Dental services are provided in all areas of dental care including fillings, crowns and bridges, complete and partial dentures, implants, gum treatment, root canals, and extractions. Preventive care is also provided. Treatment is provided to both adults and children. Patients are selected via a lottery and there is often a waiting list.   [facility] [address], [place]  [phone] [email or web address]   [facility] [address] [place], [place] [phone], Ext. [phone] Second and Fourth Thursday of each month, opens at 6:30 AM; Clinic ends at 9 AM.   Patients are seen on a first-come first-served basis, and a limited number are seen during each clinic.   [facility]  [address] Ether Griffins [place], [place] [phone]   Eligibility Requirements You must have lived in [place], [place], or [place] counties for at least the last three months.   You cannot be eligible for state or federal sponsored [place], including [employer], [place], or [employer].   You generally cannot be eligible for healthcare insurance through your employer.    How to apply: Eligibility screenings are held every Tuesday and Wednesday afternoon from 1:00 pm until 4:00 pm. You do not need an appointment for the interview!  [facility] [address], [place], [place] [phone]   [facility] Health Department  [phone]   [facility] Health Department  [phone]   [facility] Health Department  [phone]    Behavioral Health Resources in the Community: Intensive Outpatient Programs Organization         Address     Phone             Notes  [facility] Services [address] [place], [place] [phone]   [facility]patient [address], [place], [place] [phone]   [facility]: [facility] [address] [place], [place]  [phone]   [facility] Mental Health [address]  [place], [place] [phone] or [phone]     Substance Abuse Resources Organization         Address     Phone             Notes  [facility] Drug Services  [phone]   [facility]  [phone]   [facility]  ([phone]   Floydene Flock  ([phone]   Residential & Outpatient Substance Abuse Program  ([phone]   Psychological Services Organization         Address     Phone             Notes  [facility] Behavioral Health  [phone]   [facility]  [phone]   [employer] [address]  [place] [phone] or [phone]    Mobile Crisis Teams Organization         Address     Phone             Notes  [facility], Mobile Crisis Care Unit  [phone]   [facility]  [address] [place], [place] [phone]   Doristine Locks [address] [place] [phone]    Self-Help/Support Groups Organization         Address     Phone             Notes  [facility] [place] [facility] - variety of support groups  336- I7437963 Call for more information  Narcotics Anonymous (NA), [facility] [address], [employer] [place]  2 meetings at this location   Statistician         Address     Phone             Notes  [facility] [address],    [place]  [phone]   [facility]  [address], [place] [address], [place], [place] [phone]   [facility] Treatment Facility [address] [place], [place] [phone] Admissions: 8am-3pm M-F  [facility] [address]    [place], [place] [phone]   [facility] [address],  [place], [place] [phone]   [facility] [address]  [place], [place] [phone]   [facility] - Intensive Outpatient [address] Laure[address], [place], [place] [phone]   [facility] (Addiction Recovery [facility] [address] [place]  [place], [place] [phone] or [phone]   [facility] ([facility]) [address], [place], [place] [phone] Accepts Medicaid  [facility] [address]  [place] [phone] Substance Abuse/Addiction Treatment   [facility] Organization         Address     Phone             Notes  [facility]  ([phone]   Angie Fava, PhD [address] Ervin Knack [place], [place]   [phone] or (702)[phone]   [facility] Behavioral   [address] [place], [place] [phone]   [facility] [address] [place], [place] [phone] Insurance/Medicaid/sponsorship through [facility] and Families [address]                                    [place], [place] [phone] Therapy/tele-psych/case  [facility] [address] [place], [place] [phone]    Dr. Lolly Mustache  [phone]   [facility]  [facility] Dept. 1) [address] [place] 2) [address], [place] 3)  [address], [place] [phone]  ([phone]   [facility] Child Abuse Hotline (445) 073-5782 or (510) 040-8222 (After Hours)     [facility] Resources: Abuse and Neglect Organization         Address     Phone             Notes  Child/Elder Abuse Hotline  843-205-1678   Family Abuse Services  240-357-2747 24 hour crisis line  [facility]  478-364-8069   [facility] Domestic Violence Hotline  (720) 109-2318    Behavioral Health & Substance Use Organization         Address     Phone             Notes  [employer]   951-434-1121 24 hour crisis line  [facility]  [address], [place] [phone] Monday- Friday, walk-in,  8am-8pm  [facility] Detoxification & Crisis Stabilization  831-321-4684   Alcoholics Anonymous 269-751-4761 [employer]  Narcotics Anonymous  713-634-3816     Health Clinics & Urgent Care Centers Organization         Address     Phone             Notes  [facility] Health Department  [phone]   [facility] at [facility]  [phone]   [facility]  [phone]   [facility]  (816)806-3390 Uninsured patients meeting eligibility requirement  [facility]      [facility]  [phone]      [facility]     Health Center  [phone]       [facility]  [phone]      [facility]   [phone] [employer]     [place] [employer]  773-054-4049  Education officer, environmental             Notes  [facility]  (682) [phone]   [place] DSS  (440) 831-4928 Medicaid, Nutrition, Medicine Assistance, Utility Assistance  [facility] (579)023-1642   [place] [facility]  (531) 336-8234   [facility]  (779)746-2474 [facility] Shelter  [facility] [place]  5170163543 Adult & family shelter, food, utility & rent assistance  24 Hour crisis line for those facing homelessness  202-431-6745   [facility] Transit  630-752-9524 [place], [facility] public transportation system  Homecare Providers  [phone] HIV/AIDS Case Management, FREE HIV SCREEN  Medication Management  907-858-6413 Ongoing medication assistance for patients meeting eligibility requirements  Medication Drop Box Locations: [place] Police Dept., [facility] Police Dept., [employer] Police Dept., [facility] office  Safely rid of unused medications  [employer]  918-391-5845 Crisis assistance, medication, housing, food, utility assistance  [employer] Info.  [facility])  [phone]

## 2016-03-19 ENCOUNTER — Emergency Department (HOSPITAL_BASED_OUTPATIENT_CLINIC_OR_DEPARTMENT_OTHER)
Admission: EM | Admit: 2016-03-19 | Discharge: 2016-03-19 | Disposition: A | Discharge status: Home / Self Care | Payer: Self-pay | Attending: Emergency Medicine | Admitting: Emergency Medicine

## 2016-03-19 ENCOUNTER — Encounter (HOSPITAL_BASED_OUTPATIENT_CLINIC_OR_DEPARTMENT_OTHER): Payer: Self-pay | Admitting: Emergency Medicine

## 2016-03-19 ENCOUNTER — Emergency Department (HOSPITAL_BASED_OUTPATIENT_CLINIC_OR_DEPARTMENT_OTHER): Payer: Self-pay

## 2016-03-19 DIAGNOSIS — K629 Disease of anus and rectum, unspecified: Secondary | ICD-10-CM

## 2016-03-19 DIAGNOSIS — N83202 Unspecified ovarian cyst, left side: Secondary | ICD-10-CM | POA: Insufficient documentation

## 2016-03-19 DIAGNOSIS — Z79899 Other long term (current) drug therapy: Secondary | ICD-10-CM | POA: Insufficient documentation

## 2016-03-19 DIAGNOSIS — J029 Acute pharyngitis, unspecified: Secondary | ICD-10-CM | POA: Insufficient documentation

## 2016-03-19 DIAGNOSIS — I10 Essential (primary) hypertension: Secondary | ICD-10-CM | POA: Insufficient documentation

## 2016-03-19 DIAGNOSIS — Z7982 Long term (current) use of aspirin: Secondary | ICD-10-CM | POA: Insufficient documentation

## 2016-03-19 DIAGNOSIS — J020 Streptococcal pharyngitis: Secondary | ICD-10-CM

## 2016-03-19 DIAGNOSIS — K6289 Other specified diseases of anus and rectum: Secondary | ICD-10-CM | POA: Insufficient documentation

## 2016-03-19 DIAGNOSIS — R740 Nonspecific elevation of levels of transaminase and lactic acid dehydrogenase [LDH]: Secondary | ICD-10-CM | POA: Insufficient documentation

## 2016-03-19 DIAGNOSIS — R7401 Elevation of levels of liver transaminase levels: Secondary | ICD-10-CM

## 2016-03-19 DIAGNOSIS — K5900 Constipation, unspecified: Secondary | ICD-10-CM | POA: Insufficient documentation

## 2016-03-19 DIAGNOSIS — R1012 Left upper quadrant pain: Secondary | ICD-10-CM

## 2016-03-19 LAB — COMPREHENSIVE METABOLIC PANEL
ALBUMIN: 3.6 g/dL (ref 3.5–5.0)
ALT: 88 U/L — ABNORMAL HIGH (ref 14–54)
ANION GAP: 8 (ref 5–15)
AST: 132 U/L — AB (ref 15–41)
Alkaline Phosphatase: 115 U/L (ref 38–126)
BUN: 9 mg/dL (ref 6–20)
CALCIUM: 8.4 mg/dL — AB (ref 8.9–10.3)
CHLORIDE: 102 mmol/L (ref 101–111)
CO2: 23 mmol/L (ref 22–32)
Creatinine, Ser: 0.69 mg/dL (ref 0.44–1.00)
GFR calc non Af Amer: >60 mL/min (ref >60)
Glucose, Bld: 114 mg/dL — ABNORMAL HIGH (ref 65–99)
POTASSIUM: 3.6 mmol/L (ref 3.5–5.1)
SODIUM: 133 mmol/L — AB (ref 135–145)
Total Bilirubin: 0.7 mg/dL (ref 0.3–1.2)
Total Protein: 7.6 g/dL (ref 6.5–8.1)

## 2016-03-19 LAB — URINALYSIS, ROUTINE W REFLEX MICROSCOPIC
BILIRUBIN URINE: NEGATIVE
GLUCOSE, UA: NEGATIVE mg/dL
KETONES UR: NEGATIVE mg/dL
Nitrite: NEGATIVE
PROTEIN: NEGATIVE mg/dL
Specific Gravity, Urine: 1.017 (ref 1.005–1.030)
pH: 8 (ref 5.0–8.0)

## 2016-03-19 LAB — LIPASE, BLOOD: Lipase: 59 U/L — ABNORMAL HIGH (ref 11–51)

## 2016-03-19 LAB — PREGNANCY, URINE: PREG TEST UR: NEGATIVE

## 2016-03-19 LAB — CBC WITH DIFFERENTIAL/PLATELET
BASOS ABS: 0 10*3/uL (ref 0.0–0.1)
BASOS PCT: 0 %
EOS ABS: 0 10*3/uL (ref 0.0–0.7)
EOS PCT: 0 %
HEMATOCRIT: 36.6 % (ref 36.0–46.0)
Hemoglobin: 12.4 g/dL (ref 12.0–15.0)
Lymphocytes Relative: 6 %
Lymphs Abs: 1 10*3/uL (ref 0.7–4.0)
MCH: 29.2 pg (ref 26.0–34.0)
MCHC: 33.9 g/dL (ref 30.0–36.0)
MCV: 86.1 fL (ref 78.0–100.0)
MONO ABS: 0.9 10*3/uL (ref 0.1–1.0)
Monocytes Relative: 6 %
NEUTROS ABS: 14.3 10*3/uL — AB (ref 1.7–7.7)
Neutrophils Relative %: 88 %
PLATELETS: 233 10*3/uL (ref 150–400)
RBC: 4.25 MIL/uL (ref 3.87–5.11)
RDW: 13 % (ref 11.5–15.5)
WBC: 16.2 10*3/uL — ABNORMAL HIGH (ref 4.0–10.5)

## 2016-03-19 LAB — URINE MICROSCOPIC-ADD ON

## 2016-03-19 LAB — RAPID STREP SCREEN (MED CTR MEBANE ONLY): STREPTOCOCCUS, GROUP A SCREEN (DIRECT): POSITIVE — AB

## 2016-03-19 LAB — I-STAT CG4 LACTIC ACID, ED: LACTIC ACID, VENOUS: 0.58 mmol/L (ref 0.5–1.9)

## 2016-03-19 MED ORDER — SODIUM CHLORIDE 0.9 % IV BOLUS (SEPSIS)
1000.0000 mL | Freq: Once | INTRAVENOUS | Status: AC
  Administered 2016-03-19: 1000 mL via INTRAVENOUS

## 2016-03-19 MED ORDER — PROMETHAZINE HCL 25 MG PO TABS
25.0000 mg | ORAL_TABLET | Freq: Once | ORAL | Status: AC
  Administered 2016-03-19: 25 mg via ORAL
  Filled 2016-03-19: qty 1

## 2016-03-19 MED ORDER — PROMETHAZINE HCL 25 MG PO TABS: 25.0000 mg | ORAL_TABLET | Freq: Four times a day (QID) | ORAL | Status: DC | PRN

## 2016-03-19 MED ORDER — MORPHINE SULFATE (PF) 4 MG/ML IV SOLN
4.0000 mg | Freq: Once | INTRAVENOUS | Status: AC
  Administered 2016-03-19: 4 mg via INTRAVENOUS
  Filled 2016-03-19: qty 1

## 2016-03-19 MED ORDER — NAPROXEN 500 MG PO TABS: 500.0000 mg | ORAL_TABLET | Freq: Three times a day (TID) | ORAL | Status: DC

## 2016-03-19 MED ORDER — AMOXICILLIN 500 MG PO CAPS: 1000.0000 mg | ORAL_CAPSULE | Freq: Every day | ORAL | Status: AC

## 2016-03-19 MED ORDER — DEXAMETHASONE SODIUM PHOSPHATE 10 MG/ML IJ SOLN
10.0000 mg | Freq: Once | INTRAMUSCULAR | Status: AC
  Administered 2016-03-19: 10 mg via INTRAVENOUS
  Filled 2016-03-19: qty 1

## 2016-03-19 MED ORDER — KETOROLAC TROMETHAMINE 15 MG/ML IJ SOLN
15.0000 mg | Freq: Once | INTRAMUSCULAR | Status: AC
  Administered 2016-03-19: 15 mg via INTRAVENOUS
  Filled 2016-03-19: qty 1

## 2016-03-19 NOTE — ED Notes (Signed)
MD at bedside. 

## 2016-03-19 NOTE — ED Provider Notes (Signed)
CSN: [id number]     Arrival date & time 03/19/16  0804 History   First MD Initiated Contact with Patient 03/19/16 [phone]     Chief Complaint  Patient presents with  . Abdominal Pain  . Sore Throat     (Consider location/radiation/quality/duration/timing/severity/associated sxs/prior Treatment) HPI   2PM yesterday began throwing up.  Has pancreatic cyst so has some chronic abdominal pain, abd pain got worse yesterday after throwing up.  Pain in LUQ radiating to left flank.  Stabbing/cramping/throbbing.  No hx of nephrolithiasis.  Some diarrhea started yesterday 3 times. Sore throat began today.  Fever today.  Pressure with urination.  No known tick exposure, no animals at home.   Past Medical History  Diagnosis Date  . Pancreatic cyst   . Pancreatitis   . Ovarian cyst   . Hypertension    Past Surgical History  Procedure Laterality Date  . Cholecystectomy    . Tubal ligation     Family History  Problem Relation Age of Onset  . CAD Neg Hx    Social History  Substance Use Topics  . Smoking status: Never Smoker   . Smokeless tobacco: Never Used  . Alcohol Use: No   OB History    No data available     Review of Systems  Constitutional: Positive for fever (102.7 at home) and appetite change.  HENT: Positive for congestion, rhinorrhea and sore throat.   Eyes: Negative for visual disturbance.  Respiratory: Negative for cough and shortness of breath.   Cardiovascular: Negative for chest pain.  Gastrointestinal: Positive for nausea, vomiting, abdominal pain and diarrhea. Negative for constipation.  Genitourinary: Positive for dysuria and difficulty urinating.  Musculoskeletal: Positive for myalgias. Negative for back pain.  Skin: Negative for rash.  Neurological: Positive for headaches. Negative for syncope.      Allergies  Zofran and Reglan  Home Medications   Prior to Admission medications   Medication Sig Start Date End Date Taking? Authorizing Provider  aspirin 81  MG tablet Take 81 mg by mouth daily.   Yes Historical Provider, MD  methadone (DOLOPHINE) 10 MG tablet Take 20 mg by mouth every 8 (eight) hours.   Yes Historical Provider, MD  amoxicillin (AMOXIL) 500 MG capsule Take 2 capsules (1,000 mg total) by mouth daily. 03/19/16 03/29/16  Alvira MondayErin Chaselynn Kepple, MD  naproxen (NAPROSYN) 500 MG tablet Take 1 tablet (500 mg total) by mouth 3 (three) times daily with meals. 03/19/16   Alvira MondayErin Shada Nienaber, MD  promethazine (PHENERGAN) 25 MG tablet Take 1 tablet (25 mg total) by mouth every 6 (six) hours as needed for nausea or vomiting. 03/19/16   Alvira MondayErin Konnor Jorden, MD   BP 152/84 mmHg  Pulse 85  Temp(Src) 100.2 F (37.9 C) (Oral)  Resp 20  Ht 5\' 7"  (1.702 m)  Wt 230 lb (104.327 kg)  BMI 36.01 kg/m2  SpO2 96%  LMP 02/24/2016 Physical Exam  Constitutional: She is oriented to person, place, and time. She appears well-developed and well-nourished. No distress.  HENT:  Head: Normocephalic and atraumatic.  Eyes: Conjunctivae and EOM are normal.  Neck: Normal range of motion.  Cardiovascular: Normal rate, regular rhythm, normal heart sounds and intact distal pulses.  Exam reveals no gallop and no friction rub.   No murmur heard. Pulmonary/Chest: Effort normal and breath sounds normal. No respiratory distress. She has no wheezes. She has no rales.  Abdominal: Soft. She exhibits no distension. There is tenderness (LUQ). There is CVA tenderness (L). There is no guarding.  Musculoskeletal: She exhibits no edema or tenderness.  Neurological: She is alert and oriented to person, place, and time.  Skin: Skin is warm and dry. No rash noted. She is not diaphoretic. No erythema.  Nursing note and vitals reviewed.   ED Course  Procedures (including critical care time) Labs Review Labs Reviewed  RAPID STREP SCREEN (NOT AT [facility]) - Abnormal; Notable for the following:    Streptococcus, Group A Screen (Direct) POSITIVE (*)    All other components within normal limits  URINALYSIS,  ROUTINE W REFLEX MICROSCOPIC (NOT AT [facility]) - Abnormal; Notable for the following:    Hgb urine dipstick SMALL (*)    Leukocytes, UA TRACE (*)    All other components within normal limits  CBC WITH DIFFERENTIAL/PLATELET - Abnormal; Notable for the following:    WBC 16.2 (*)    Neutro Abs 14.3 (*)    All other components within normal limits  COMPREHENSIVE METABOLIC PANEL - Abnormal; Notable for the following:    Sodium 133 (*)    Glucose, Bld 114 (*)    Calcium 8.4 (*)    AST 132 (*)    ALT 88 (*)    All other components within normal limits  LIPASE, BLOOD - Abnormal; Notable for the following:    Lipase 59 (*)    All other components within normal limits  URINE MICROSCOPIC-ADD ON - Abnormal; Notable for the following:    Squamous Epithelial / LPF 0-5 (*)    Bacteria, UA FEW (*)    All other components within normal limits  PREGNANCY, URINE  I-STAT CG4 LACTIC ACID, ED    Imaging Review Ct Renal Stone Study  03/19/2016  CLINICAL DATA:  Left lower quadrant pain EXAM: CT ABDOMEN AND PELVIS WITHOUT CONTRAST TECHNIQUE: Multielectrode CT imaging of the abdomen and pelvis was performed following the standard protocol without IV contrast. COMPARISON:  08/18/2013 FINDINGS: No hydronephrosis.  No urinary calculus. Postcholecystectomy Calcified granulomata in the spleen Liver, gallbladder, adrenal glands are within normal limits. Moderate stool burden throughout the colon with a relative sparing of the ascending colon. There is wall thickening of the distal rectum. Bladder, uterus, and right adnexa are within normal limits. 3.2 cm cystic abnormality in the left adnexa. No free-fluid.  No abnormal retroperitoneal adenopathy. IMPRESSION: No evidence of urinary calculus or obstruction. There is prominent stool burden as well as wall thickening of the distal rectum. Correlate clinically as for the need for colonoscopy 3.2 cm cystic abnormality in the left adnexa. 6-12 week follow-up ultrasound is  recommended to ensure resolution. Electronically Signed   By: Jolaine Click M.D.   On: 03/19/2016 09:42   I have personally reviewed and evaluated these images and lab results as part of my medical decision-making.   EKG Interpretation None      MDM   Final diagnoses:  Strep throat  Left upper quadrant pain  Constipation, unspecified constipation type  Rectal abnormality, thickening, recommend colonoscopy  Cyst of left ovary, recommend 6-12wk Ultrasound to ensure resolution  Transaminitis, mild    45yo female with hx of pancreatic cyst, htn, presents with concern for fever, abdominal pain, sore throat.  Strep screen positive.  Pt with abd pain and CT stone study done showing no sign of nephrolithiasis.  Pt with constipation, rectal thickening on CT.  No significant findings on rectal exam.  Recommend outpt GI follow up. Transaminitis present, no RUQ tenderness, doubt cholecystitis.  Recommend outpt follow up, recheck of labs. Discussed incidental adnexal cyst finding  with pt and recommend outpt [place].  Strep throat and constipation likely etiology of symptoms.  Given rx for amoxicillin, phenergan, naproxen. Discussed constipation care.  Patient discharged in stable condition with understanding of reasons to return.    Alvira Monday, MD 03/19/16 2001

## 2016-03-19 NOTE — ED Notes (Addendum)
Pt c/o LLQ abd pain, sore throat, diarrhea,  L ear pain, malaise x 2 days. Pt reports temp of 102 early this morning. Pt states she took dayquil and phenergan at 4 this morning.

## 2017-03-29 ENCOUNTER — Emergency Department (HOSPITAL_BASED_OUTPATIENT_CLINIC_OR_DEPARTMENT_OTHER)
Admission: EM | Admit: 2017-03-29 | Discharge: 2017-03-29 | Disposition: A | Discharge status: Home / Self Care | Payer: BLUE CROSS/BLUE SHIELD | Attending: Emergency Medicine | Admitting: Emergency Medicine

## 2017-03-29 ENCOUNTER — Encounter (HOSPITAL_BASED_OUTPATIENT_CLINIC_OR_DEPARTMENT_OTHER): Payer: Self-pay

## 2017-03-29 DIAGNOSIS — R110 Nausea: Secondary | ICD-10-CM | POA: Diagnosis not present

## 2017-03-29 DIAGNOSIS — I10 Essential (primary) hypertension: Secondary | ICD-10-CM | POA: Insufficient documentation

## 2017-03-29 DIAGNOSIS — Z79899 Other long term (current) drug therapy: Secondary | ICD-10-CM | POA: Diagnosis not present

## 2017-03-29 DIAGNOSIS — R51 Headache: Secondary | ICD-10-CM | POA: Diagnosis not present

## 2017-03-29 DIAGNOSIS — J069 Acute upper respiratory infection, unspecified: Secondary | ICD-10-CM | POA: Diagnosis not present

## 2017-03-29 DIAGNOSIS — K0889 Other specified disorders of teeth and supporting structures: Secondary | ICD-10-CM | POA: Diagnosis not present

## 2017-03-29 DIAGNOSIS — Z7982 Long term (current) use of aspirin: Secondary | ICD-10-CM | POA: Diagnosis not present

## 2017-03-29 DIAGNOSIS — R519 Headache, unspecified: Secondary | ICD-10-CM

## 2017-03-29 MED ORDER — ACETAMINOPHEN 325 MG PO TABS
650.0000 mg | ORAL_TABLET | Freq: Once | ORAL | Status: AC
  Administered 2017-03-29: 650 mg via ORAL
  Filled 2017-03-29: qty 2

## 2017-03-29 MED ORDER — PROMETHAZINE HCL 25 MG PO TABS: 25.0000 mg | ORAL_TABLET | Freq: Four times a day (QID) | ORAL | 0 refills | Status: DC | PRN

## 2017-03-29 MED ORDER — PENICILLIN V POTASSIUM 500 MG PO TABS: 500.0000 mg | ORAL_TABLET | Freq: Four times a day (QID) | ORAL | 0 refills | Status: AC

## 2017-03-29 MED ORDER — PROMETHAZINE HCL 25 MG PO TABS
12.5000 mg | ORAL_TABLET | Freq: Once | ORAL | Status: AC
  Administered 2017-03-29: 12.5 mg via ORAL
  Filled 2017-03-29: qty 1

## 2017-03-29 MED FILL — PENICILLIN VK 500 MG TABLET: 500 | 7 days supply | Qty: 28 | Fill #0

## 2017-03-29 MED FILL — PROMETHAZINE 25 MG TABLET: 25 | 3 days supply | Qty: 15 | Fill #0

## 2017-03-29 NOTE — ED Provider Notes (Signed)
[facility] Provider Note   CSN: [id number] Arrival date & time: 03/29/17  1229     History   Chief Complaint Chief Complaint  Patient presents with  . Headache    HPI Teresa Valenzuela is a 46 y.o. female.  HPI  Patient presents to ED for intermittent headache for the past week. She reports sinus medication helped a little with her symptoms she continues to have intermittent rhinorrhea and sinus pressure, as well as nausea. She also reports dental pain on the left side upper and lower from several broken teeth in the past. She states that this pain is chronic but worsening for the past few days. She denies any bleeding or drainage. She denies any trouble breathing, trouble swallowing, rashes. She denies any head injury or loss of consciousness. She denies any blurry vision, numbness, weakness, trouble walking, scalp tenderness, abdominal pain, diarrhea. Denies any other myalgias.  Past Medical History:  Diagnosis Date  . Hypertension   . Ovarian cyst   . Pancreatic cyst   . Pancreatitis     Patient Active Problem List   Diagnosis Date Noted  . Chest pain 06/25/2014  . Essential hypertension, benign 06/25/2014  . Hypokalemia 06/25/2014    Past Surgical History:  Procedure Laterality Date  . CHOLECYSTECTOMY    . TUBAL LIGATION      OB History    No data available       Home Medications    Prior to Admission medications   Medication Sig Start Date End Date Taking? Authorizing Provider  aspirin 81 MG tablet Take 81 mg by mouth daily.    [provider]  methadone (DOLOPHINE) 10 MG tablet Take 20 mg by mouth every 8 (eight) hours.    [provider]  penicillin v potassium (VEETID) 500 MG tablet Take 1 tablet (500 mg total) by mouth 4 (four) times daily. 03/29/17 04/05/17  [name], PA-C  promethazine (PHENERGAN) 25 MG tablet Take 1 tablet (25 mg total) by mouth every 6 (six) hours as needed for nausea or vomiting. 03/29/17   [name], PA-C    Family History Family History  Problem Relation Age of Onset  . CAD Neg Hx     Social History Social History  Substance Use Topics  . Smoking status: Never Smoker  . Smokeless tobacco: Never Used  . Alcohol use No     Allergies   Zofran and Reglan [metoclopramide]   Review of Systems Review of Systems  Constitutional: Negative for appetite change, chills and fever.  HENT: Positive for rhinorrhea, sinus pain and sinus pressure. Negative for facial swelling and trouble swallowing.   Eyes: Negative for photophobia, pain and visual disturbance.  Respiratory: Negative for cough and shortness of breath.   Musculoskeletal: Negative for arthralgias, back pain, myalgias, neck pain and neck stiffness.  Skin: Negative for color change and rash.  Neurological: Positive for headaches. Negative for dizziness, facial asymmetry, speech difficulty, weakness, light-headedness and numbness.     Physical Exam Updated Vital Signs BP (!) 172/94 (BP Location: Left Arm)   Pulse 69   Temp 98.7 F (37.1 C) (Oral)   Resp 18   Ht 5\' 6"  (1.676 m)   Wt 120.7 kg (266 lb)   LMP  (LMP Unknown)   SpO2 99%   BMI 42.93 kg/m   Physical Exam  Constitutional: She is oriented to person, place, and time. She appears well-developed and well-nourished. No distress.  HENT:  Head: Normocephalic and atraumatic.  Mouth/Throat:    Tenderness to palpation over the indicated gumline. There is no visible abscess that needs drainage at this time. There are several missing teeth and chipped teeth with poor overall dentition. No facial, neck or cheek swelling noted. No pooling of secretions or trismus.  Normal voice noted with no difficulty swallowing or breathing.   Eyes: Conjunctivae and EOM are normal. No scleral icterus.  Neck: Normal range of motion.  Pulmonary/Chest: Effort normal. No respiratory distress.  Musculoskeletal: She exhibits no tenderness.  No scalp tenderness.  Neurological:  She is alert and oriented to person, place, and time. No cranial nerve deficit or sensory deficit. She exhibits normal muscle tone. Coordination normal.  Pupils reactive. No facial asymmetry noted. Cranial nerves appear grossly intact. Sensation intact to light touch on face, BUE and BLE. Strength 5/5 in BUE and BLE. Normal patellar reflexes bilaterally.   Skin: No rash noted. She is not diaphoretic.  Psychiatric: She has a normal mood and affect.  Nursing note and vitals reviewed.    ED Treatments / Results  Labs (all labs ordered are listed, but only abnormal results are displayed) Labs Reviewed - No data to display  EKG  EKG Interpretation None       Radiology No results found.  Procedures Procedures (including critical care time)  Medications Ordered in ED Medications  promethazine (PHENERGAN) tablet 12.5 mg (12.5 mg Oral Given 03/29/17 1303)  acetaminophen (TYLENOL) tablet 650 mg (650 mg Oral Given 03/29/17 1304)     Initial Impression / Assessment and Plan / ED Course  I have reviewed the triage vital signs and the nursing notes.  Pertinent labs & imaging results that were available during my care of the patient were reviewed by me and considered in my medical decision making (see chart for details).     Patient presents to ED for evaluation of headache and feeling "sinus pressure" for the past week as well as tooth pain for the past few months. She reports some alleviation of symptoms of headache with the sinus medication. She reports associated rhinorrhea and toothache. There are several missing teeth present. No drainage or bleeding or visible abscess noted. No facial, neck or cheek swelling noted. No pooling of secretions or trismus.  Normal voice noted with no difficulty swallowing or breathing. No signs of drainable abscess or concern for Ludwig's angina or other deep infection of the neck. She does have some tenderness to palpation of the left maxillary sinus. She  is afebrile. This history. She has no scalp tenderness that would concern me for temporal arteritis as being the cause of her headache. She denies any vision changes, headaches or loss of consciousness. Physical exam there are no focal deficits on neurological exam. Cranial nerves appear intact, she has normal sensation and strength in bilateral upper extremities. Patient given Tylenol and Phenergan here in the ED with relief of her symptoms. We'll advise patient to take penicillin for her dental infection, Phenergan for her nausea and continue Tylenol or ibuprofen as needed for pain. Advised to follow up with dentist for further evaluation. Patient appears stable for discharge at this time. Strict return precautions given.  Final Clinical Impressions(s) / ED Diagnoses   Final diagnoses:  Acute nonintractable headache, unspecified headache type  Upper respiratory tract infection, unspecified type  Pain, dental  Nausea    New Prescriptions New Prescriptions   PENICILLIN V POTASSIUM (VEETID) 500 MG TABLET    Take 1 tablet (500 mg total) by mouth 4 (four)  times daily.   PROMETHAZINE (PHENERGAN) 25 MG TABLET    Take 1 tablet (25 mg total) by mouth every 6 (six) hours as needed for nausea or vomiting.     [name], PA-C 03/29/17 1347    Maia Plan, MD 03/29/17 [phone]

## 2017-03-29 NOTE — ED Triage Notes (Signed)
C/o HA x 1+ week-sinus med "helped a little"-also c/o broken tooth left upper and pain to left lower tooth-NAD-steady gait

## 2017-03-29 NOTE — Discharge Instructions (Signed)
Please read attached information regarding her condition. Take penicillin 4 times daily for 1 week. Take Phenergan as needed for nausea. Follow-up with dentist said below for further evaluation. Return to ED for worsening headache, vision changes, headache injury, loss of consciousness, trouble breathing, trouble swallowing.

## 2017-06-08 ENCOUNTER — Emergency Department (HOSPITAL_BASED_OUTPATIENT_CLINIC_OR_DEPARTMENT_OTHER): Payer: BLUE CROSS/BLUE SHIELD

## 2017-06-08 ENCOUNTER — Emergency Department (HOSPITAL_BASED_OUTPATIENT_CLINIC_OR_DEPARTMENT_OTHER)
Admission: EM | Admit: 2017-06-08 | Discharge: 2017-06-08 | Disposition: A | Discharge status: Home / Self Care | Payer: BLUE CROSS/BLUE SHIELD | Attending: Emergency Medicine | Admitting: Emergency Medicine

## 2017-06-08 ENCOUNTER — Encounter (HOSPITAL_BASED_OUTPATIENT_CLINIC_OR_DEPARTMENT_OTHER): Payer: Self-pay

## 2017-06-08 DIAGNOSIS — Z79899 Other long term (current) drug therapy: Secondary | ICD-10-CM | POA: Insufficient documentation

## 2017-06-08 DIAGNOSIS — S29012A Strain of muscle and tendon of back wall of thorax, initial encounter: Secondary | ICD-10-CM | POA: Diagnosis not present

## 2017-06-08 DIAGNOSIS — R519 Headache, unspecified: Secondary | ICD-10-CM

## 2017-06-08 DIAGNOSIS — Y939 Activity, unspecified: Secondary | ICD-10-CM | POA: Diagnosis not present

## 2017-06-08 DIAGNOSIS — R079 Chest pain, unspecified: Secondary | ICD-10-CM

## 2017-06-08 DIAGNOSIS — Z7982 Long term (current) use of aspirin: Secondary | ICD-10-CM | POA: Diagnosis not present

## 2017-06-08 DIAGNOSIS — R51 Headache: Secondary | ICD-10-CM | POA: Diagnosis present

## 2017-06-08 DIAGNOSIS — Y929 Unspecified place or not applicable: Secondary | ICD-10-CM | POA: Diagnosis not present

## 2017-06-08 DIAGNOSIS — G43909 Migraine, unspecified, not intractable, without status migrainosus: Secondary | ICD-10-CM | POA: Insufficient documentation

## 2017-06-08 DIAGNOSIS — Y999 Unspecified external cause status: Secondary | ICD-10-CM | POA: Insufficient documentation

## 2017-06-08 DIAGNOSIS — I10 Essential (primary) hypertension: Secondary | ICD-10-CM | POA: Insufficient documentation

## 2017-06-08 DIAGNOSIS — X58XXXA Exposure to other specified factors, initial encounter: Secondary | ICD-10-CM | POA: Insufficient documentation

## 2017-06-08 MED ORDER — DEXAMETHASONE SODIUM PHOSPHATE 10 MG/ML IJ SOLN
10.0000 mg | Freq: Once | INTRAMUSCULAR | Status: AC
  Administered 2017-06-08: 10 mg via INTRAVENOUS
  Filled 2017-06-08: qty 1

## 2017-06-08 MED ORDER — DIPHENHYDRAMINE HCL 50 MG/ML IJ SOLN
25.0000 mg | Freq: Once | INTRAMUSCULAR | Status: AC
  Administered 2017-06-08: 25 mg via INTRAVENOUS
  Filled 2017-06-08: qty 1

## 2017-06-08 MED ORDER — PROCHLORPERAZINE EDISYLATE 5 MG/ML IJ SOLN
10.0000 mg | Freq: Once | INTRAMUSCULAR | Status: AC
  Administered 2017-06-08: 10 mg via INTRAVENOUS
  Filled 2017-06-08: qty 2

## 2017-06-08 MED ORDER — SODIUM CHLORIDE 0.9 % IV BOLUS (SEPSIS)
1000.0000 mL | Freq: Once | INTRAVENOUS | Status: AC
  Administered 2017-06-08: 1000 mL via INTRAVENOUS

## 2017-06-08 NOTE — ED Notes (Signed)
Pt on monitor 

## 2017-06-08 NOTE — Discharge Instructions (Signed)
You may use over-the-counter Motrin (Ibuprofen), Acetaminophen (Tylenol), topical muscle creams such as SalonPas, Icy Hot, Bengay, etc. Please stretch, apply heat, and have massage therapy for additional assistance. ° °

## 2017-06-08 NOTE — ED Triage Notes (Signed)
C/o HA x 4 days-CP x 2 days-dizziness started 0930-NAD-steady gait

## 2017-06-08 NOTE — ED Provider Notes (Signed)
[facility] Provider Note   CSN: [id number] Arrival date & time: 06/08/17  1249     History   Chief Complaint Chief Complaint  Patient presents with  . Headache  . Chest Pain    HPI Teresa Valenzuela is a 46 y.o. female.  The history is provided by the patient.  Headache   This is a recurrent problem. Episode onset: 5 days. Episode frequency: intermittent. Progression since onset: fluctuating. The headache is associated with bright light and loud noise. The pain is located in the bilateral and temporal (migrating from side to side) region. The quality of the pain is described as dull and sharp. The pain is moderate. The pain does not radiate. Associated symptoms include nausea. Pertinent negatives include no fever, no malaise/fatigue, no chest pressure, no near-syncope, no palpitations, no syncope, no shortness of breath and no vomiting. Treatments tried: phenergan. The treatment provided moderate relief.  Chest Pain   This is a new problem. Episode onset: 3 days. Episode frequency: intermittent. The problem has been resolved. The pain is associated with rest. The pain is present in the substernal region. The pain is moderate. The quality of the pain is described as dull and sharp. The pain radiates to the upper back. Episode Length: few seconds to minutes. Associated symptoms include back pain, headaches and nausea. Pertinent negatives include no cough, no exertional chest pressure, no fever, no leg pain, no lower extremity edema, no malaise/fatigue, no near-syncope, no palpitations, no shortness of breath, no syncope and no vomiting.  Her past medical history is significant for hypertension.  Pertinent negatives for past medical history include no CAD, no cancer, no diabetes, no DVT, no hyperlipidemia, no MI, no PE and no strokes.  Pertinent negatives for family medical history include: no hyperlipidemia and no hypertension.  Procedure history is positive for exercise  treadmill test (negative stress test in 2015).    Past Medical History:  Diagnosis Date  . Hypertension   . Ovarian cyst   . Pancreatic cyst   . Pancreatitis     Patient Active Problem List   Diagnosis Date Noted  . Chest pain 06/25/2014  . Essential hypertension, benign 06/25/2014  . Hypokalemia 06/25/2014    Past Surgical History:  Procedure Laterality Date  . CHOLECYSTECTOMY    . TUBAL LIGATION      OB History    No data available       Home Medications    Prior to Admission medications   Medication Sig Start Date End Date Taking? Authorizing Provider  aspirin 81 MG tablet Take 81 mg by mouth daily.    [provider]  methadone (DOLOPHINE) 10 MG tablet Take 20 mg by mouth every 8 (eight) hours.    [provider]  promethazine (PHENERGAN) 25 MG tablet Take 1 tablet (25 mg total) by mouth every 6 (six) hours as needed for nausea or vomiting. 03/29/17   [name], PA-C    Family History Family History  Problem Relation Age of Onset  . CAD Neg Hx     Social History Social History  Substance Use Topics  . Smoking status: Never Smoker  . Smokeless tobacco: Never Used  . Alcohol use No     Allergies   Zofran and Reglan [metoclopramide]   Review of Systems Review of Systems  Constitutional: Negative for fever and malaise/fatigue.  Respiratory: Negative for cough and shortness of breath.   Cardiovascular: Positive for chest pain. Negative for palpitations, syncope and near-syncope.  Gastrointestinal: Positive for nausea. Negative for vomiting.  Musculoskeletal: Positive for back pain.  Neurological: Positive for headaches.   All other systems are reviewed and are negative for acute change except as noted in the HPI   Physical Exam Updated Vital Signs BP (!) 172/93 (BP Location: Left Arm)   Pulse 72   Temp 98.9 F (37.2 C) (Oral)   Resp 20   Ht  (1.676 m)   Wt 120.7 kg (266 lb)   LMP 06/08/2017   SpO2 97%   BMI 42.93  kg/m   Physical Exam  Constitutional: She is oriented to person, place, and time. She appears well-developed and well-nourished. No distress.  HENT:  Head: Normocephalic and atraumatic.  Nose: Nose normal.  Eyes: Pupils are equal, round, and reactive to light. Conjunctivae and EOM are normal. Right eye exhibits no discharge. Left eye exhibits no discharge. No scleral icterus.  Neck: Normal range of motion. Neck supple.  Cardiovascular: Normal rate and regular rhythm.  Exam reveals no gallop and no friction rub.   No murmur heard. Pulmonary/Chest: Effort normal and breath sounds normal. No stridor. No respiratory distress. She has no rales.  Abdominal: Soft. She exhibits no distension. There is no tenderness.  Musculoskeletal: She exhibits no edema.       Cervical back: She exhibits tenderness, pain and spasm. She exhibits no bony tenderness.       Back:  Palpation of the thoracic paraspinal muscles completely reproduces the patient's chest pain.  Neurological: She is alert and oriented to person, place, and time.  Mental Status: Alert and oriented to person, place, and time. Attention and concentration normal. Speech clear. Recent memory is intact  Cranial Nerves  II Visual Fields: Intact to confrontation. Visual fields intact. III, IV, VI: Pupils equal and reactive to light and near. Full eye movement without nystagmus  V Facial Sensation: Normal. No weakness of masticatory muscles  VII: No facial weakness or asymmetry  VIII Auditory Acuity: Grossly normal  IX/X: The uvula is midline; the palate elevates symmetrically  XI: Normal sternocleidomastoid and trapezius strength  XII: The tongue is midline. No atrophy or fasciculations.   Motor System: Muscle Strength: 5/5 and symmetric in the upper and lower extremities. No pronation or drift.  Muscle Tone: Tone and muscle bulk are normal in the upper and lower extremities.   Reflexes: DTRs: 1+ and symmetrical in all four extremities.  Plantar responses are flexor bilaterally.  Coordination: Intact finger-to-nose, heel-to-shin. No tremor.  Sensation: Intact to light touch, and pinprick. Negative Romberg test.  Gait: Routine gait normal.    Skin: Skin is warm and dry. No rash noted. She is not diaphoretic. No erythema.  Psychiatric: She has a normal mood and affect.  Vitals reviewed.    ED Treatments / Results  Labs (all labs ordered are listed, but only abnormal results are displayed) Labs Reviewed - No data to display  EKG  EKG Interpretation  Date/Time:  Thursday June 08 2017 13:01:05 EDT Ventricular Rate:  75 PR Interval:  136 QRS Duration: 92 QT Interval:  408 QTC Calculation: 455 R Axis:   67 Text Interpretation:  Normal sinus rhythm Cannot rule out Anterior infarct , age undetermined Abnormal ECG No significant change since last tracing Confirmed by Drema Pry [phone]) on 06/08/2017 1:26:17 PM       Radiology Dg Chest 2 View  Result Date: 06/08/2017 CLINICAL DATA:  Chest pain EXAM: CHEST  2 VIEW COMPARISON:  Jan 13, 2015 FINDINGS: Eventration of  the right hemidiaphragm anteriorly is noted. There is no edema or consolidation. Heart size and pulmonary vascularity are normal. No adenopathy. No evident bone lesions. IMPRESSION: Anterior eventration of the right hemidiaphragm. No edema or consolidation. Cardiac silhouette within normal limits. Electronically Signed   By: Bretta Bang III M.D.   On: 06/08/2017 14:36    Procedures Procedures (including critical care time)  Medications Ordered in ED Medications  diphenhydrAMINE (BENADRYL) injection 25 mg (25 mg Intravenous Given 06/08/17 1437)  dexamethasone (DECADRON) injection 10 mg (10 mg Intravenous Given 06/08/17 1436)  prochlorperazine (COMPAZINE) injection 10 mg (10 mg Intravenous Given 06/08/17 1436)  sodium chloride 0.9 % bolus 1,000 mL (0 mLs Intravenous Stopped 06/08/17 1522)     Initial Impression / Assessment and Plan / ED Course    I have reviewed the triage vital signs and the nursing notes.  Pertinent labs & imaging results that were available during my care of the patient were reviewed by me and considered in my medical decision making (see chart for details).     1. Headache Typical migraine headache for the pt. Non focal neuro exam. No recent head trauma. No fever. Doubt meningitis. Doubt intracranial bleed. Doubt IIH. No indication for imaging. Will treat with migraine cocktail and reevaluate.  Complete resolution of her headache following cocktail.  2. Chest/back pain Currently asymptomatic. Atypical in nature. EKG without acute ischemic changes or evidence of pericarditis. Chest x-ray without evidence suggestive of pneumonia, pneumothorax, pneumomediastinum.  No abnormal contour of the mediastinum to suggest dissection. No evidence of acute injuries.  Pain is completely reproduced with palpation of the spastic thoracic parascapular muscles. Low suspicion for cardiac etiology. Low pretest probability for pulmonary embolism. PERC negative. Presentation is classic for aortic dissection or esophageal perforation. Feel this is mostly muscular in nature.  The patient is safe for discharge with strict return precautions.   Final Clinical Impressions(s) / ED Diagnoses   Final diagnoses:  Chest pain  Bad headache  Upper back strain, initial encounter   Disposition: Discharge  Condition: Good  I have discussed the results, Dx and Tx plan with the patient who expressed understanding and agree(s) with the plan. Discharge instructions discussed at great length. The patient was given strict return precautions who verbalized understanding of the instructions. No further questions at time of discharge.    New Prescriptions   No medications on file    Follow Up: Primary care provider  Schedule an appointment as soon as possible for a visit  If symptoms do not improve or  worsen      Sebastien Jackson, Amadeo Garnet, MD 06/08/17 1600

## 2017-06-08 NOTE — ED Notes (Signed)
ED Provider at bedside. 

## 2017-06-08 NOTE — ED Notes (Signed)
Patient transported to X-ray 

## 2017-07-25 ENCOUNTER — Encounter (HOSPITAL_BASED_OUTPATIENT_CLINIC_OR_DEPARTMENT_OTHER): Payer: Self-pay

## 2017-07-25 ENCOUNTER — Emergency Department (HOSPITAL_BASED_OUTPATIENT_CLINIC_OR_DEPARTMENT_OTHER)
Admission: EM | Admit: 2017-07-25 | Discharge: 2017-07-25 | Disposition: A | Discharge status: Home / Self Care | Payer: BLUE CROSS/BLUE SHIELD | Attending: Physician Assistant | Admitting: Physician Assistant

## 2017-07-25 ENCOUNTER — Other Ambulatory Visit: Payer: Self-pay

## 2017-07-25 DIAGNOSIS — I10 Essential (primary) hypertension: Secondary | ICD-10-CM | POA: Insufficient documentation

## 2017-07-25 DIAGNOSIS — K0889 Other specified disorders of teeth and supporting structures: Secondary | ICD-10-CM | POA: Diagnosis not present

## 2017-07-25 DIAGNOSIS — Z79899 Other long term (current) drug therapy: Secondary | ICD-10-CM | POA: Insufficient documentation

## 2017-07-25 DIAGNOSIS — Z7982 Long term (current) use of aspirin: Secondary | ICD-10-CM | POA: Diagnosis not present

## 2017-07-25 MED ORDER — PENICILLIN V POTASSIUM 500 MG PO TABS: 500.0000 mg | ORAL_TABLET | Freq: Four times a day (QID) | ORAL | 0 refills | Status: AC

## 2017-07-25 MED FILL — PENICILLIN VK 500 MG TABLET: 500 | 7 days supply | Qty: 28 | Fill #0

## 2017-07-25 NOTE — ED Provider Notes (Signed)
[facility] EMERGENCY DEPARTMENT Provider Note   CSN: [id number] Arrival date & time: 07/25/17  1106     History   Chief Complaint Chief Complaint  Patient presents with  . Dental Pain    HPI  Teresa Valenzuela is a 46 y.o. Female who presents complaining of lower toothache over her left lower molars.  She reports she finally got dental insurance and has an appointment on the 21st with a dentist, but feels like she is getting an infection in this area in the meantime.  Patient reports she has had dental infections in this area periodically ever since the teeth broke.  She reports antibiotics typically help.  She has been using ibuprofen and Tylenol for pain with some improvement.  No fevers or chills, no difficulty swallowing has been able to eat and drink normally, no difficulty breathing or shortness of breath.  She reports occasional facial swelling on the left side, no swelling in the neck or under the chin.      Past Medical History:  Diagnosis Date  . Hypertension   . Ovarian cyst   . Pancreatic cyst   . Pancreatitis     Patient Active Problem List   Diagnosis Date Noted  . Chest pain 06/25/2014  . Essential hypertension, benign 06/25/2014  . Hypokalemia 06/25/2014    Past Surgical History:  Procedure Laterality Date  . CHOLECYSTECTOMY    . TUBAL LIGATION      OB History    No data available       Home Medications    Prior to Admission medications   Medication Sig Start Date End Date Taking? Authorizing Provider  ibuprofen (ADVIL,MOTRIN) 200 MG tablet Take 200 mg every 6 (six) hours as needed by mouth.   Yes [provider]  aspirin 81 MG tablet Take 81 mg by mouth daily.    [provider]  methadone (DOLOPHINE) 10 MG tablet Take 20 mg by mouth every 8 (eight) hours.    [provider]  penicillin v potassium (VEETID) 500 MG tablet Take 1 tablet (500 mg total) 4 (four) times daily for 7 days by mouth. 07/25/17  08/01/17  Dartha LodgeFord, Jesseka Drinkard N, PA-C  promethazine (PHENERGAN) 25 MG tablet Take 1 tablet (25 mg total) by mouth every 6 (six) hours as needed for nausea or vomiting. 03/29/17   Dietrich PatesKhatri, Hina, PA-C    Family History Family History  Problem Relation Age of Onset  . CAD Neg Hx     Social History Social History   Tobacco Use  . Smoking status: Never Smoker  . Smokeless tobacco: Never Used  Substance Use Topics  . Alcohol use: No  . Drug use: Yes    Types: Marijuana     Allergies   Zofran and Reglan [metoclopramide]   Review of Systems Review of Systems  Constitutional: Negative for chills and fever.  HENT: Positive for dental problem and facial swelling. Negative for drooling, mouth sores, sore throat, trouble swallowing and voice change.   Eyes: Negative for pain.  Respiratory: Negative for wheezing and stridor.   Gastrointestinal: Negative for nausea and vomiting.  Musculoskeletal: Negative for neck pain.     Physical Exam Updated Vital Signs BP (!) 154/69 (BP Location: Left Arm)   Pulse 88   Temp 98.3 F (36.8 C) (Oral)   Resp 20   Ht 5\' 6"  (1.676 m)   Wt 120.2 kg (265 lb)   LMP 07/22/2017   SpO2 98%   BMI 42.77  kg/m   Physical Exam  Constitutional: She appears well-developed and well-nourished. No distress.  HENT:  Head: Normocephalic and atraumatic.  Mouth/Throat:    Teeth in poor dentition, no gross abscess, no sublingual tenderness or swelling, posterior oropharynx is clear, uvula midline, no trismus or torticollis, no facial or neck swelling.  Eyes: EOM are normal. Right eye exhibits no discharge. Left eye exhibits no discharge.  Neck: Neck supple.  Pulmonary/Chest: Effort normal. No respiratory distress.  Lymphadenopathy:    She has no cervical adenopathy.  Neurological: She is alert. Coordination normal.  Skin: Skin is dry. She is not diaphoretic.  Psychiatric: She has a normal mood and affect. Her behavior is normal.  Nursing note and vitals  reviewed.    ED Treatments / Results  Labs (all labs ordered are listed, but only abnormal results are displayed) Labs Reviewed - No data to display  EKG  EKG Interpretation None       Radiology No results found.  Procedures Procedures (including critical care time)  Medications Ordered in ED Medications - No data to display   Initial Impression / Assessment and Plan / ED Course  I have reviewed the triage vital signs and the nursing notes.  Pertinent labs & imaging results that were available during my care of the patient were reviewed by me and considered in my medical decision making (see chart for details).  Patient with toothache.  No gross abscess.  Exam unconcerning for Ludwig's angina or spread of infection.  Will treat with penicillin and anti-inflammatories medicine.  Urged patient to follow-up with dentist, pt has appt on 21st.   Final Clinical Impressions(s) / ED Diagnoses   Final diagnoses:  Pain, dental  Tooth pain    ED Discharge Orders        Ordered    penicillin v potassium (VEETID) 500 MG tablet  4 times daily     07/25/17 1200       Dartha LodgeFord, Daphene Chisholm N, [place]PA-C 07/25/17 1942    Abelino DerrickMackuen, Courteney Lyn, MD 07/27/17 1041

## 2017-07-25 NOTE — ED Triage Notes (Signed)
Pt c/o left lower toothache-states [facility] [place] [facility] [address]steady gait

## 2017-07-25 NOTE — Discharge Instructions (Signed)
Please complete course of antibiotics as directed.  Ibuprofen or Tylenol for pain.  Follow-up with your dentist on the 21st as planned.  If you develop difficulty breathing or swallowing, worsening swelling, fevers or chills please return to the ED for sooner evaluation.

## 2017-07-25 NOTE — ED Notes (Signed)
ED Provider at bedside. 

## 2017-08-11 DIAGNOSIS — R51 Headache: Secondary | ICD-10-CM | POA: Diagnosis not present

## 2017-08-11 DIAGNOSIS — R200 Anesthesia of skin: Secondary | ICD-10-CM | POA: Diagnosis not present

## 2017-08-11 DIAGNOSIS — R5383 Other fatigue: Secondary | ICD-10-CM | POA: Diagnosis not present

## 2017-08-11 DIAGNOSIS — H538 Other visual disturbances: Secondary | ICD-10-CM | POA: Diagnosis not present

## 2017-08-11 DIAGNOSIS — R300 Dysuria: Secondary | ICD-10-CM | POA: Diagnosis not present

## 2017-08-25 DIAGNOSIS — D649 Anemia, unspecified: Secondary | ICD-10-CM | POA: Diagnosis not present

## 2017-08-25 DIAGNOSIS — I10 Essential (primary) hypertension: Secondary | ICD-10-CM | POA: Diagnosis not present

## 2017-08-25 DIAGNOSIS — N921 Excessive and frequent menstruation with irregular cycle: Secondary | ICD-10-CM | POA: Diagnosis not present

## 2017-09-22 DIAGNOSIS — I10 Essential (primary) hypertension: Secondary | ICD-10-CM | POA: Diagnosis not present

## 2017-09-22 DIAGNOSIS — D509 Iron deficiency anemia, unspecified: Secondary | ICD-10-CM | POA: Diagnosis not present

## 2017-09-22 DIAGNOSIS — Z713 Dietary counseling and surveillance: Secondary | ICD-10-CM | POA: Diagnosis not present

## 2017-12-08 ENCOUNTER — Emergency Department (HOSPITAL_BASED_OUTPATIENT_CLINIC_OR_DEPARTMENT_OTHER)
Admission: EM | Admit: 2017-12-08 | Discharge: 2017-12-08 | Disposition: A | Discharge status: Home / Self Care | Payer: BLUE CROSS/BLUE SHIELD | Attending: Emergency Medicine | Admitting: Emergency Medicine

## 2017-12-08 ENCOUNTER — Encounter (HOSPITAL_BASED_OUTPATIENT_CLINIC_OR_DEPARTMENT_OTHER): Payer: Self-pay | Admitting: *Deleted

## 2017-12-08 ENCOUNTER — Emergency Department (HOSPITAL_BASED_OUTPATIENT_CLINIC_OR_DEPARTMENT_OTHER): Payer: BLUE CROSS/BLUE SHIELD

## 2017-12-08 ENCOUNTER — Other Ambulatory Visit: Payer: Self-pay

## 2017-12-08 DIAGNOSIS — R69 Illness, unspecified: Secondary | ICD-10-CM

## 2017-12-08 DIAGNOSIS — J111 Influenza due to unidentified influenza virus with other respiratory manifestations: Secondary | ICD-10-CM | POA: Diagnosis not present

## 2017-12-08 DIAGNOSIS — R0789 Other chest pain: Secondary | ICD-10-CM | POA: Diagnosis not present

## 2017-12-08 DIAGNOSIS — Z7982 Long term (current) use of aspirin: Secondary | ICD-10-CM | POA: Insufficient documentation

## 2017-12-08 DIAGNOSIS — J069 Acute upper respiratory infection, unspecified: Secondary | ICD-10-CM

## 2017-12-08 DIAGNOSIS — R072 Precordial pain: Secondary | ICD-10-CM | POA: Diagnosis not present

## 2017-12-08 DIAGNOSIS — Z79899 Other long term (current) drug therapy: Secondary | ICD-10-CM | POA: Insufficient documentation

## 2017-12-08 DIAGNOSIS — R079 Chest pain, unspecified: Secondary | ICD-10-CM | POA: Diagnosis not present

## 2017-12-08 DIAGNOSIS — I10 Essential (primary) hypertension: Secondary | ICD-10-CM | POA: Insufficient documentation

## 2017-12-08 DIAGNOSIS — K5900 Constipation, unspecified: Secondary | ICD-10-CM | POA: Diagnosis not present

## 2017-12-08 LAB — COMPREHENSIVE METABOLIC PANEL
ALT: 29 U/L (ref 14–54)
ANION GAP: 10 (ref 5–15)
AST: 52 U/L — ABNORMAL HIGH (ref 15–41)
Albumin: 3.8 g/dL (ref 3.5–5.0)
Alkaline Phosphatase: 75 U/L (ref 38–126)
BUN: 17 mg/dL (ref 6–20)
CHLORIDE: 101 mmol/L (ref 101–111)
CO2: 23 mmol/L (ref 22–32)
CREATININE: 0.72 mg/dL (ref 0.44–1.00)
Calcium: 8.6 mg/dL — ABNORMAL LOW (ref 8.9–10.3)
Glucose, Bld: 134 mg/dL — ABNORMAL HIGH (ref 65–99)
POTASSIUM: 3.4 mmol/L — AB (ref 3.5–5.1)
SODIUM: 134 mmol/L — AB (ref 135–145)
Total Bilirubin: 0.4 mg/dL (ref 0.3–1.2)
Total Protein: 7.2 g/dL (ref 6.5–8.1)

## 2017-12-08 LAB — CBC WITH DIFFERENTIAL/PLATELET
Basophils Absolute: 0 10*3/uL (ref 0.0–0.1)
Basophils Relative: 0 %
EOS ABS: 0.2 10*3/uL (ref 0.0–0.7)
EOS PCT: 3 %
HCT: 37.9 % (ref 36.0–46.0)
Hemoglobin: 12.5 g/dL (ref 12.0–15.0)
LYMPHS ABS: 1.1 10*3/uL (ref 0.7–4.0)
LYMPHS PCT: 17 %
MCH: 29.3 pg (ref 26.0–34.0)
MCHC: 33 g/dL (ref 30.0–36.0)
MCV: 89 fL (ref 78.0–100.0)
MONO ABS: 0.7 10*3/uL (ref 0.1–1.0)
Monocytes Relative: 11 %
Neutro Abs: 4.4 10*3/uL (ref 1.7–7.7)
Neutrophils Relative %: 69 %
PLATELETS: 228 10*3/uL (ref 150–400)
RBC: 4.26 MIL/uL (ref 3.87–5.11)
RDW: 12.9 % (ref 11.5–15.5)
WBC: 6.4 10*3/uL (ref 4.0–10.5)

## 2017-12-08 LAB — URINALYSIS, ROUTINE W REFLEX MICROSCOPIC

## 2017-12-08 LAB — PREGNANCY, URINE: PREG TEST UR: NEGATIVE

## 2017-12-08 LAB — URINALYSIS, MICROSCOPIC (REFLEX)

## 2017-12-08 LAB — LIPASE, BLOOD: LIPASE: 72 U/L — AB (ref 11–51)

## 2017-12-08 LAB — INFLUENZA PANEL BY PCR (TYPE A & B)
Influenza A By PCR: POSITIVE — AB
Influenza B By PCR: NEGATIVE

## 2017-12-08 LAB — TROPONIN I: Troponin I: <0.03 ng/mL (ref <0.03)

## 2017-12-08 MED ORDER — IOPAMIDOL (ISOVUE-370) INJECTION 76%
100.0000 mL | Freq: Once | INTRAVENOUS | Status: AC | PRN
  Administered 2017-12-08: 100 mL via INTRAVENOUS

## 2017-12-08 MED ORDER — ONDANSETRON 4 MG PO TBDP: 4.0000 mg | ORAL_TABLET | Freq: Once | ORAL | Status: DC

## 2017-12-08 MED ORDER — OSELTAMIVIR PHOSPHATE 75 MG PO CAPS: 75.0000 mg | ORAL_CAPSULE | Freq: Two times a day (BID) | ORAL | 0 refills | Status: DC

## 2017-12-08 MED ORDER — PROMETHAZINE HCL 25 MG PO TABS
25.0000 mg | ORAL_TABLET | Freq: Once | ORAL | Status: AC
  Administered 2017-12-08: 25 mg via ORAL
  Filled 2017-12-08: qty 1

## 2017-12-08 MED ORDER — FENTANYL CITRATE (PF) 100 MCG/2ML IJ SOLN
50.0000 ug | Freq: Once | INTRAMUSCULAR | Status: AC
  Administered 2017-12-08: 50 ug via INTRAVENOUS
  Filled 2017-12-08: qty 2

## 2017-12-08 MED ORDER — PROMETHAZINE HCL 25 MG PO TABS: 25.0000 mg | ORAL_TABLET | Freq: Four times a day (QID) | ORAL | 0 refills | Status: DC | PRN

## 2017-12-08 MED FILL — PROMETHAZINE 25 MG TABLET: 25 | 3 days supply | Qty: 15 | Fill #0

## 2017-12-08 NOTE — Discharge Instructions (Signed)
Your workup today was overall reassuring.  There is no evidence of dissection or problem with your aorta.  There is no evidence of a cardiac cause of your symptoms with the reassuring cardiac enzyme after having pain for the last day.  Given the exposure to influenza and your cold and cough symptoms, I am concerned you may have the flu.  The flu test will not return until tomorrow so please check my chart to see if it is positive.  We are giving the prescription for Tamiflu to take for the next 5 days if it is positive.  Please stay hydrated and rest.  If any symptoms change or worsen, please return to the nearest emergency department.  Please follow-up with a primary doctor in several days.

## 2017-12-08 NOTE — ED Provider Notes (Signed)
[facility] EMERGENCY DEPARTMENT Provider Note   CSN: [id number] Arrival date & time: 12/08/17  0900     History   Chief Complaint Chief Complaint  Patient presents with  . Chest Pain    HPI Teresa Valenzuela is a 47 y.o. female.  The history is provided by the patient and medical records. No language interpreter was used.  Chest Pain   This is a new problem. The current episode started more than 2 days ago. The problem occurs constantly. The problem has not changed since onset.The pain is associated with coughing. The pain is present in the lateral region and substernal region. The pain is at a severity of 6/10. The pain is moderate. The quality of the pain is described as dull and sharp. The pain radiates to the upper back, left shoulder and left arm. Associated symptoms include cough, a fever and nausea. Pertinent negatives include no abdominal pain, no back pain, no diaphoresis, no dizziness, no exertional chest pressure, no headaches, no irregular heartbeat, no leg pain, no lower extremity edema, no malaise/fatigue, no near-syncope, no palpitations, no shortness of breath, no sputum production, no syncope and no vomiting. She has tried nothing for the symptoms. The treatment provided no relief. Risk factors include obesity.    Past Medical History:  Diagnosis Date  . Hypertension   . Ovarian cyst   . Pancreatic cyst   . Pancreatitis     Patient Active Problem List   Diagnosis Date Noted  . Chest pain 06/25/2014  . Essential hypertension, benign 06/25/2014  . Hypokalemia 06/25/2014    Past Surgical History:  Procedure Laterality Date  . CHOLECYSTECTOMY    . TUBAL LIGATION       OB History   None      Home Medications    Prior to Admission medications   Medication Sig Start Date End Date Taking? Authorizing Provider  methadone (DOLOPHINE) 10 MG tablet Take 20 mg by mouth every 8 (eight) hours.   Yes [provider]  aspirin 81 MG tablet  Take 81 mg by mouth daily.    [provider]  ibuprofen (ADVIL,MOTRIN) 200 MG tablet Take 200 mg every 6 (six) hours as needed by mouth.    [provider]  promethazine (PHENERGAN) 25 MG tablet Take 1 tablet (25 mg total) by mouth every 6 (six) hours as needed for nausea or vomiting. 03/29/17   Teresa Pates, PA-C    Family History Family History  Problem Relation Age of Onset  . CAD Neg Hx     Social History Social History   Tobacco Use  . Smoking status: Never Smoker  . Smokeless tobacco: Never Used  Substance Use Topics  . Alcohol use: No  . Drug use: Yes    Types: Marijuana     Allergies   Zofran and Reglan [metoclopramide]   Review of Systems Review of Systems  Constitutional: Positive for fever. Negative for chills, diaphoresis, fatigue and malaise/fatigue.  HENT: Positive for congestion and rhinorrhea.   Eyes: Negative for visual disturbance.  Respiratory: Positive for cough. Negative for sputum production, choking, chest tightness, shortness of breath, wheezing and stridor.   Cardiovascular: Positive for chest pain. Negative for palpitations, leg swelling, syncope and near-syncope.  Gastrointestinal: Positive for nausea. Negative for abdominal pain, constipation, diarrhea and vomiting.  Genitourinary: Positive for vaginal bleeding (on menstrual cycle). Negative for dysuria.  Musculoskeletal: Negative for back pain, neck pain and neck stiffness.  Skin: Negative for rash and wound.  Neurological: Negative for dizziness, light-headedness and headaches.  Psychiatric/Behavioral: Negative for agitation.  All other systems reviewed and are negative.    Physical Exam Updated Vital Signs BP (!) 169/98   Pulse 97   Temp 100 F (37.8 C) (Oral)   Resp 18   Ht 5\' 7"  (1.702 m)   Wt 122.5 kg (270 lb)   LMP 12/05/2017 (Exact Date)   SpO2 98%   BMI 42.29 kg/m   Physical Exam  Constitutional: She is oriented to person, place, and time. She appears  well-developed and well-nourished. No distress.  HENT:  Head: Normocephalic.  Nose: Nose normal.  Mouth/Throat: Oropharynx is clear and moist. No oropharyngeal exudate.  Eyes: Pupils are equal, round, and reactive to light. Conjunctivae and EOM are normal.  Neck: Normal range of motion. No JVD present.  Cardiovascular: Normal rate and intact distal pulses.  No murmur heard. Pulmonary/Chest: Effort normal. No stridor. No respiratory distress. She has no wheezes. She has no rales.     She exhibits tenderness.    Abdominal: Soft. She exhibits no distension. There is no tenderness. There is no guarding.  Musculoskeletal: She exhibits tenderness. She exhibits no edema.  Lymphadenopathy:    She has no cervical adenopathy.  Neurological: She is alert and oriented to person, place, and time. No sensory deficit. She exhibits normal muscle tone.  Skin: Skin is warm. Capillary refill takes less than 2 seconds. No rash noted. She is not diaphoretic. No erythema.  Psychiatric: She has a normal mood and affect.  Nursing note and vitals reviewed.    ED Treatments / Results  Labs (all labs ordered are listed, but only abnormal results are displayed) Labs Reviewed  INFLUENZA PANEL BY PCR (TYPE A & B) - Abnormal; Notable for the following components:      Result Value   Influenza A By PCR POSITIVE (*)    All other components within normal limits  COMPREHENSIVE METABOLIC PANEL - Abnormal; Notable for the following components:   Sodium 134 (*)    Potassium 3.4 (*)    Glucose, Bld 134 (*)    Calcium 8.6 (*)    AST 52 (*)    All other components within normal limits  LIPASE, BLOOD - Abnormal; Notable for the following components:   Lipase 72 (*)    All other components within normal limits  URINALYSIS, ROUTINE W REFLEX MICROSCOPIC - Abnormal; Notable for the following components:   Color, Urine RED (*)    APPearance TURBID (*)    Glucose, UA   (*)    Value: TEST NOT REPORTED DUE TO COLOR  INTERFERENCE OF URINE PIGMENT   Hgb urine dipstick   (*)    Value: TEST NOT REPORTED DUE TO COLOR INTERFERENCE OF URINE PIGMENT   Bilirubin Urine   (*)    Value: TEST NOT REPORTED DUE TO COLOR INTERFERENCE OF URINE PIGMENT   Ketones, ur   (*)    Value: TEST NOT REPORTED DUE TO COLOR INTERFERENCE OF URINE PIGMENT   Protein, ur   (*)    Value: TEST NOT REPORTED DUE TO COLOR INTERFERENCE OF URINE PIGMENT   Nitrite   (*)    Value: TEST NOT REPORTED DUE TO COLOR INTERFERENCE OF URINE PIGMENT   Leukocytes, UA   (*)    Value: TEST NOT REPORTED DUE TO COLOR INTERFERENCE OF URINE PIGMENT   All other components within normal limits  URINALYSIS, MICROSCOPIC (REFLEX) - Abnormal; Notable for the following components:   Bacteria,  UA MANY (*)    Squamous Epithelial / LPF 6-30 (*)    All other components within normal limits  CBC WITH DIFFERENTIAL/PLATELET  TROPONIN I  PREGNANCY, URINE  TROPONIN I    EKG EKG Interpretation  Date/Time:  Friday December 08 2017 09:13:33 EDT Ventricular Rate:  91 PR Interval:    QRS Duration: 109 QT Interval:  380 QTC Calculation: 468 R Axis:   56 Text Interpretation:  Sinus rhythm When compared to prior, no significant changes seen.  No STEMI Confirmed by Theda Belfastegeler, Chris ([id number]) on 12/08/2017 9:23:17 AM   Radiology Dg Chest 2 View  Result Date: 12/08/2017 CLINICAL DATA:  Chest pain and nausea for 1 week.  Nonsmoker. EXAM: CHEST - 2 VIEW COMPARISON:  06/08/2017 and 01/13/2015 radiographs. FINDINGS: Stable eventration of the right hemidiaphragm anteriorly. The heart size and mediastinal contours are normal. The lungs are clear. There is no pleural effusion or pneumothorax. No acute osseous findings are identified. Telemetry leads overlie the chest. IMPRESSION: Stable chest.  No active cardiopulmonary process. Electronically Signed   By: Carey BullocksWilliam  Veazey M.D.   On: 12/08/2017 10:05   Ct Angio Chest/abd/pel For Dissection W And/or Wo Contrast  Result Date:  12/08/2017 CLINICAL DATA:  Left-sided chest pain for 1 week, worse with movement, intermittent. EXAM: CT ANGIOGRAPHY CHEST, ABDOMEN AND PELVIS TECHNIQUE: Multidetector CT imaging through the chest, abdomen and pelvis was performed using the standard protocol during bolus administration of intravenous contrast. Multiplanar reconstructed images and MIPs were obtained and reviewed to evaluate the vascular anatomy. CONTRAST:  100mL ISOVUE-370 IOPAMIDOL (ISOVUE-370) INJECTION 76% COMPARISON:  CT abdomen dated 03/19/2016. Chest x-ray from earlier same day. FINDINGS: CTA CHEST FINDINGS Cardiovascular: Thoracic aorta is normal in caliber and configuration. No thoracic aortic aneurysm or dissection. Heart size is normal. No pericardial effusion. No central obstructing pulmonary embolism. Mediastinum/Nodes: No mass or enlarged lymph nodes within the mediastinum or perihilar regions. Esophagus appears normal. Trachea and central bronchi appear normal. Lungs/Pleura: Lungs are clear.  No pleural effusion or pneumothorax. Musculoskeletal: Osseous structures about the chest are unremarkable. No acute or suspicious osseous finding. Superficial soft tissues are unremarkable. Review of the MIP images confirms the above findings. CTA ABDOMEN AND PELVIS FINDINGS VASCULAR Aorta: Normal caliber aorta without aneurysm, dissection, vasculitis or significant stenosis. Celiac: Patent without evidence of aneurysm, dissection, vasculitis or significant stenosis. SMA: Patent without evidence of aneurysm, dissection, vasculitis or significant stenosis. Renals: Both renal arteries are patent without evidence of aneurysm, dissection, vasculitis, fibromuscular dysplasia or significant stenosis. IMA: Patent without evidence of aneurysm, dissection, vasculitis or significant stenosis. Inflow: Patent without evidence of aneurysm, dissection, vasculitis or significant stenosis. Veins: No obvious venous abnormality within the limitations of this  arterial phase study. Review of the MIP images confirms the above findings. NON-VASCULAR Hepatobiliary: No focal liver abnormality is seen. Status post cholecystectomy with expected bile duct ectasia. Pancreas: Unremarkable. No pancreatic ductal dilatation or surrounding inflammatory changes. Spleen: Normal in size without focal abnormality. Adrenals/Urinary Tract: Adrenal glands are unremarkable. Kidneys appear normal without mass, stone or hydronephrosis. No perinephric inflammation. No ureteral or bladder calculi identified. Bladder appears normal, partially decompressed. Stomach/Bowel: Bowel is normal in caliber. Fairly large amount of stool throughout the nondistended colon. No bowel wall thickening or evidence of bowel wall inflammation. Appendix is normal. Stomach is unremarkable, partially decompressed. Lymphatic: No significant vascular findings are present. No enlarged abdominal or pelvic lymph nodes. Reproductive: Uterus and right adnexal regions are unremarkable. Again noted is a 3 cm hypodense mass  in the left adnexal region, cystic appearance on earlier CT of 03/19/2016, stable. Other: No free fluid or abscess collection. No free intraperitoneal air. Musculoskeletal: Degenerative changes at the L4-5 and L5-S1 levels, at least moderate in degree with moderate central canal stenosis and suspected nerve root impingement. No acute or suspicious osseous finding. Superficial soft tissues are unremarkable. Review of the MIP images confirms the above findings. IMPRESSION: 1. No acute findings within the chest, abdomen or pelvis. No thoracic or abdominal aortic aneurysm or dissection. 2. Fairly large amount of stool throughout the nondistended colon (constipation?). 3. **An incidental finding of potential clinical significance has been found. Stable 3 cm hypodense mass in the left adnexal region, cystic appearance on earlier CT of 03/19/2016. The nearly 2 year stability suggests benignity but would consider  further characterization with nonemergent pelvic ultrasound to ensure benignity.** 4. Degenerative changes in the lower lumbar spine, at least moderate in degree with suspected nerve root impingement. If any radiculopathic symptoms, would consider further characterization with nonemergent lumbar spine MRI. Electronically Signed   By: Bary Richard M.D.   On: 12/08/2017 12:35    Procedures Procedures (including critical care time)  Medications Ordered in ED Medications  fentaNYL (SUBLIMAZE) injection 50 mcg (50 mcg Intravenous Given 12/08/17 1033)  iopamidol (ISOVUE-370) 76 % injection 100 mL (100 mLs Intravenous Contrast Given 12/08/17 1218)  promethazine (PHENERGAN) tablet 25 mg (25 mg Oral Given 12/08/17 1511)     Initial Impression / Assessment and Plan / ED Course  I have reviewed the triage vital signs and the nursing notes.  Pertinent labs & imaging results that were available during my care of the patient were reviewed by me and considered in my medical decision making (see chart for details).     Teresa Valenzuela is a 47 y.o. female with a past medical history significant for hypertension, prior pancreatitis and keratic cysts, and ovarian cyst who presents with 1 week of chest pain, and congestion, rhinorrhea, mild cough, nausea, fever today, and elevated blood pressures.  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  She reports that she has not had fevers or chills although she was febrile today on arrival.  Patient does report some rhinorrhea and congestion as well as a dry cough for the last few days.  She says that more concerning to her has been pain that has been in her chest that radiates directly through to her back and radiates down her left arm.  She reports this is a new pain that she has not had in the past.  She denies any trauma.  She denies shortness of breath associated with it as well as denying any diaphoresis or  palpitations.  She does report some nausea but no vomiting.  She denies any const patient, diarrhea, or dysuria.  She denies any pelvic symptoms although she is on her menstrual cycle currently.  She reports that she takes methadone every day and last 2 days has had elevated blood pressures in the 180s systolic and 170 systolic respectively.  She denies any history of aortic disease or problems.  She describes the pain as a sharp and aching pain located in her central left chest.  She reports that her pain is worsened with palpation both of her back and her chest.  On exam, lungs are clear.  Chest and back are tender to palpation.  Patient had no focal neurologic deficits.  Symmetric pulses  in upper and lower extremity's.  No discoloration of the legs.  No significant edema seen.  Abdomen is nontender.  EKG revealed a sinus rhythm with no evidence of STEMI.  Based on patient's symptoms as well as her known exposure to influenza I am concerned that she may have influenza or a pneumonia causing her cough congestion fever and pain.  The tenderness and the pain that is reproduced on her chest and back suggest a muscular skeletal type pain in relation to her coughing however her report that the pain radiates straight through to her back and has worsened with her elevated blood pressure brings up the consideration of aortic pathology.  Patient will have troponin and chest x-ray initially.  If kidney function is reassuring, will consider doing a dissection study to rule out aortic involvement.  Influenza test will be sent however it is unclear if that will return today at this facility.  Patient was given a dose of pain medicine during initial workup.    Anticipate reassessment after workup.  Initial x-ray reassuring however due to patient's description of pain radiating straight through to her back, CT scan ordered to evaluate aorta.  Aorta scan reassuring.  Troponin negative.  Lipase elevated but do not  feel it is significantly elevated for pancreatitis.  Patient's pain improved with medications.  I suspect patient has musculoskeletal pain related to URI and possible influenza.  Patient given prescription for Tamiflu as her symptoms worsened in the last 24 hours.  Patient will check to see if influenza test is positive on my chart.  Patient agreeable to this plan.  After patient left, flu test is positive.  Patient will fill her prescription for Tamiflu.  Patient understands all the BP as well as return precautions.  Patient had no other questions or concerns and was discharged in good condition with improving symptoms.    Final Clinical Impressions(s) / ED Diagnoses   Final diagnoses:  Precordial pain  Upper respiratory tract infection, unspecified type  Influenza-like illness    ED Discharge Orders        Ordered    oseltamivir (TAMIFLU) 75 MG capsule  Every 12 hours     12/08/17 1432    promethazine (PHENERGAN) 25 MG tablet  Every 6 hours PRN     12/08/17 1509      Clinical Impression: 1. Precordial pain   2. Upper respiratory tract infection, unspecified type   3. Influenza-like illness     Disposition: Discharge  Condition: Good  I have discussed the results, Dx and Tx plan with the pt(& family if present). He/she/they expressed understanding and agree(s) with the plan. Discharge instructions discussed at great length. Strict return precautions discussed and pt &/or family have verbalized understanding of the instructions. No further questions at time of discharge.    New Prescriptions   OSELTAMIVIR (TAMIFLU) 75 MG CAPSULE    Take 1 capsule (75 mg total) by mouth every 12 (twelve) hours.   PROMETHAZINE (PHENERGAN) 25 MG TABLET    Take 1 tablet (25 mg total) by mouth every 6 (six) hours as needed for nausea or vomiting.    Follow Up: [facility] [address] [place] [address] [phone] Schedule an  appointment as soon as possible for a visit    [facility]MENT [address] [id number] [address] [place] [address] [phone]       Suad Autrey, Canary Brim, MD 12/08/17 [phone]

## 2017-12-08 NOTE — ED Triage Notes (Signed)
Pt has been having CP for a week and pain moves to her back. Center of chest and sometimes moves to L arm.

## 2017-12-08 NOTE — ED Notes (Signed)
Patient was discharged and was brought back to her room d/t being nauseous and feeling sick.  Phenergan PO was given by another nurse.   Patient is currently back in her room and still feeling sick to her stomach.

## 2018-01-30 NOTE — Progress Notes (Deleted)
Psychiatric Initial Adult Assessment   Patient Identification: Teresa Valenzuela MRN:  [id number] Date of Evaluation:  01/30/2018 Referral Source: *** Chief Complaint:   Visit Diagnosis: No diagnosis found.  History of Present Illness:   Teresa Valenzuela is a 47 y.o. year old female with a history of hypertension, who is referred for     Associated Signs/Symptoms: Depression Symptoms:  {DEPRESSION SYMPTOMS:20000} (Hypo) Manic Symptoms:  {BHH MANIC SYMPTOMS:22872} Anxiety Symptoms:  {BHH ANXIETY SYMPTOMS:22873} Psychotic Symptoms:  {BHH PSYCHOTIC SYMPTOMS:22874} PTSD Symptoms: {BHH PTSD SYMPTOMS:22875}  Past Psychiatric History:  Outpatient:  Psychiatry admission:  Previous suicide attempt:  Past trials of medication:  History of violence:   Previous Psychotropic Medications: {YES/NO:21197}  Substance Abuse History in the last 12 months:  {yes no:314532}  Consequences of Substance Abuse: {BHH CONSEQUENCES OF SUBSTANCE ABUSE:22880}  Past Medical History:  Past Medical History:  Diagnosis Date  . Hypertension   . Ovarian cyst   . Pancreatic cyst   . Pancreatitis     Past Surgical History:  Procedure Laterality Date  . CHOLECYSTECTOMY    . TUBAL LIGATION      Family Psychiatric History: ***  Family History:  Family History  Problem Relation Age of Onset  . CAD Neg Hx     Social History:   Social History   Socioeconomic History  . Marital status: Married    Spouse name: Not on file  . Number of children: Not on file  . Years of education: Not on file  . Highest education level: Not on file  Occupational History  . Not on file  Social Needs  . Financial resource strain: Not on file  . Food insecurity:    Worry: Not on file    Inability: Not on file  . Transportation needs:    Medical: Not on file    Non-medical: Not on file  Tobacco Use  . Smoking status: Never Smoker  . Smokeless tobacco: Never Used  Substance and Sexual Activity  . Alcohol  use: No  . Drug use: Yes    Types: Marijuana  . Sexual activity: Not on file  Lifestyle  . Physical activity:    Days per week: Not on file    Minutes per session: Not on file  . Stress: Not on file  Relationships  . Social connections:    Talks on phone: Not on file    Gets together: Not on file    Attends religious service: Not on file    Active member of club or organization: Not on file    Attends meetings of clubs or organizations: Not on file    Relationship status: Not on file  Other Topics Concern  . Not on file  Social History Narrative  . Not on file    Additional Social History: ***  Allergies:   Allergies  Allergen Reactions  . Zofran Other (See Comments)    headache  . Reglan [Metoclopramide] Palpitations    Metabolic Disorder Labs: No results found for: HGBA1C, MPG No results found for: PROLACTIN Lab Results  Component Value Date   CHOL  01/08/2010    152        ATP III CLASSIFICATION:  <200     mg/dL   Desirable  119-147  mg/dL   Borderline High  >=829    mg/dL   High          TRIG 562 (H) 01/08/2010   HDL 30 (L) 01/08/2010   CHOLHDL 5.1 01/08/2010  VLDL 31 01/08/2010   LDLCALC  01/08/2010    91        Total Cholesterol/HDL:CHD Risk Coronary Heart Disease Risk Table                     Men   Women  1/2 Average Risk   3.4   3.3  Average Risk       5.0   4.4  2 X Average Risk   9.6   7.1  3 X Average Risk  23.4   11.0        Use the calculated Patient Ratio above and the CHD Risk Table to determine the patient's CHD Risk.        ATP III CLASSIFICATION (LDL):  <100     mg/dL   Optimal  161-096  mg/dL   Near or Above                    Optimal  130-159  mg/dL   Borderline  045-409  mg/dL   High  >811     mg/dL   Very High     Current Medications: Current Outpatient Medications  Medication Sig Dispense Refill  . aspirin 81 MG tablet Take 81 mg by mouth daily.    Marland Kitchen ibuprofen (ADVIL,MOTRIN) 200 MG tablet Take 200 mg every 6 (six)  hours as needed by mouth.    . methadone (DOLOPHINE) 10 MG tablet Take 20 mg by mouth every 8 (eight) hours.    Marland Kitchen oseltamivir (TAMIFLU) 75 MG capsule Take 1 capsule (75 mg total) by mouth every 12 (twelve) hours. 10 capsule 0  . promethazine (PHENERGAN) 25 MG tablet Take 1 tablet (25 mg total) by mouth every 6 (six) hours as needed for nausea or vomiting. 15 tablet 0  . promethazine (PHENERGAN) 25 MG tablet Take 1 tablet (25 mg total) by mouth every 6 (six) hours as needed for nausea or vomiting. 15 tablet 0   No current facility-administered medications for this visit.     Neurologic: Headache: No Seizure: No Paresthesias:No  Musculoskeletal: Strength & Muscle Tone: within normal limits Gait & Station: normal Patient leans: N/A  Psychiatric Specialty Exam: ROS  There were no vitals taken for this visit.There is no height or weight on file to calculate BMI.  General Appearance: Fairly Groomed  Eye Contact:  Good  Speech:  Clear and Coherent  Volume:  Normal  Mood:  {BHH MOOD:22306}  Affect:  {Affect (PAA):22687}  Thought Process:  Coherent  Orientation:  Full (Time, Place, and Person)  Thought Content:  Logical  Suicidal Thoughts:  {ST/HT (PAA):22692}  Homicidal Thoughts:  {ST/HT (PAA):22692}  Memory:  Immediate;   Good  Judgement:  {Judgement (PAA):22694}  Insight:  {Insight (PAA):22695}  Psychomotor Activity:  Normal  Concentration:  Concentration: Good and Attention Span: Good  Recall:  Good  Fund of Knowledge:Good  Language: Good  Akathisia:  No  Handed:  Right  AIMS (if indicated):  N/A  Assets:  Communication Skills Desire for Improvement  ADL's:  Intact  Cognition: WNL  Sleep:  ***   Assessment  Plan  The patient demonstrates the following risk factors for suicide: Chronic risk factors for suicide include: {Chronic Risk Factors for BJYNWGN:56213086}. Acute risk factors for suicide include: {Acute Risk Factors for VHQIONG:29528413}. Protective factors for  this patient include: {Protective Factors for Suicide KGMW:10272536}. Considering these factors, the overall suicide risk at this point appears to be {Desc; low/moderate/high:110033}. Patient {ACTION; IS/IS  JXB:14782956} appropriate for outpatient follow up.   Treatment Plan Summary: Plan as above   Neysa Hotter, MD 5/21/201912:50 PM

## 2018-02-01 ENCOUNTER — Ambulatory Visit (HOSPITAL_COMMUNITY): Payer: BLUE CROSS/BLUE SHIELD | Admitting: Psychiatry

## 2018-02-26 NOTE — Progress Notes (Deleted)
Psychiatric Initial Adult Assessment   Patient Identification: Teresa Valenzuela MRN:  [id number] Date of Evaluation:  02/26/2018 Referral Source: *** Chief Complaint:   Visit Diagnosis: No diagnosis found.  History of Present Illness:   Teresa Valenzuela is a 47 y.o. year old female with a history of hypertension, who is referred for    Associated Signs/Symptoms: Depression Symptoms:  {DEPRESSION SYMPTOMS:20000} (Hypo) Manic Symptoms:  {BHH MANIC SYMPTOMS:22872} Anxiety Symptoms:  {BHH ANXIETY SYMPTOMS:22873} Psychotic Symptoms:  {BHH PSYCHOTIC SYMPTOMS:22874} PTSD Symptoms: {BHH PTSD SYMPTOMS:22875}  Past Psychiatric History:  Outpatient:  Psychiatry admission:  Previous suicide attempt:  Past trials of medication:  History of violence:  Previous Psychotropic Medications: {YES/NO:21197}  Substance Abuse History in the last 12 months:  {yes no:314532}  Consequences of Substance Abuse: {BHH CONSEQUENCES OF SUBSTANCE ABUSE:22880}  Past Medical History:  Past Medical History:  Diagnosis Date  . Hypertension   . Ovarian cyst   . Pancreatic cyst   . Pancreatitis     Past Surgical History:  Procedure Laterality Date  . CHOLECYSTECTOMY    . TUBAL LIGATION      Family Psychiatric History: ***  Family History:  Family History  Problem Relation Age of Onset  . CAD Neg Hx     Social History:   Social History   Socioeconomic History  . Marital status: Married    Spouse name: Not on file  . Number of children: Not on file  . Years of education: Not on file  . Highest education level: Not on file  Occupational History  . Not on file  Social Needs  . Financial resource strain: Not on file  . Food insecurity:    Worry: Not on file    Inability: Not on file  . Transportation needs:    Medical: Not on file    Non-medical: Not on file  Tobacco Use  . Smoking status: Never Smoker  . Smokeless tobacco: Never Used  Substance and Sexual Activity  . Alcohol use:  No  . Drug use: Yes    Types: Marijuana  . Sexual activity: Not on file  Lifestyle  . Physical activity:    Days per week: Not on file    Minutes per session: Not on file  . Stress: Not on file  Relationships  . Social connections:    Talks on phone: Not on file    Gets together: Not on file    Attends religious service: Not on file    Active member of club or organization: Not on file    Attends meetings of clubs or organizations: Not on file    Relationship status: Not on file  Other Topics Concern  . Not on file  Social History Narrative  . Not on file    Additional Social History: ***  Allergies:   Allergies  Allergen Reactions  . Zofran Other (See Comments)    headache  . Reglan [Metoclopramide] Palpitations    Metabolic Disorder Labs: No results found for: HGBA1C, MPG No results found for: PROLACTIN Lab Results  Component Value Date   CHOL  01/08/2010    152        ATP III CLASSIFICATION:  <200     mg/dL   Desirable  846-962  mg/dL   Borderline High  >=952    mg/dL   High          TRIG 841 (H) 01/08/2010   HDL 30 (L) 01/08/2010   CHOLHDL 5.1 01/08/2010  VLDL 31 01/08/2010   LDLCALC  01/08/2010    91        Total Cholesterol/HDL:CHD Risk Coronary Heart Disease Risk Table                     Men   Women  1/2 Average Risk   3.4   3.3  Average Risk       5.0   4.4  2 X Average Risk   9.6   7.1  3 X Average Risk  23.4   11.0        Use the calculated Patient Ratio above and the CHD Risk Table to determine the patient's CHD Risk.        ATP III CLASSIFICATION (LDL):  <100     mg/dL   Optimal  960-454  mg/dL   Near or Above                    Optimal  130-159  mg/dL   Borderline  098-119  mg/dL   High  >147     mg/dL   Very High     Current Medications: Current Outpatient Medications  Medication Sig Dispense Refill  . aspirin 81 MG tablet Take 81 mg by mouth daily.    Marland Kitchen ibuprofen (ADVIL,MOTRIN) 200 MG tablet Take 200 mg every 6 (six) hours  as needed by mouth.    . methadone (DOLOPHINE) 10 MG tablet Take 20 mg by mouth every 8 (eight) hours.    Marland Kitchen oseltamivir (TAMIFLU) 75 MG capsule Take 1 capsule (75 mg total) by mouth every 12 (twelve) hours. 10 capsule 0  . promethazine (PHENERGAN) 25 MG tablet Take 1 tablet (25 mg total) by mouth every 6 (six) hours as needed for nausea or vomiting. 15 tablet 0  . promethazine (PHENERGAN) 25 MG tablet Take 1 tablet (25 mg total) by mouth every 6 (six) hours as needed for nausea or vomiting. 15 tablet 0   No current facility-administered medications for this visit.     Neurologic: Headache: No Seizure: No Paresthesias:No  Musculoskeletal: Strength & Muscle Tone: within normal limits Gait & Station: normal Patient leans: N/A  Psychiatric Specialty Exam: ROS  There were no vitals taken for this visit.There is no height or weight on file to calculate BMI.  General Appearance: Fairly Groomed  Eye Contact:  Good  Speech:  Clear and Coherent  Volume:  Normal  Mood:  {BHH MOOD:22306}  Affect:  {Affect (PAA):22687}  Thought Process:  Coherent  Orientation:  Full (Time, Place, and Person)  Thought Content:  Logical  Suicidal Thoughts:  {ST/HT (PAA):22692}  Homicidal Thoughts:  {ST/HT (PAA):22692}  Memory:  Immediate;   Good  Judgement:  {Judgement (PAA):22694}  Insight:  {Insight (PAA):22695}  Psychomotor Activity:  Normal  Concentration:  Concentration: Good and Attention Span: Good  Recall:  Good  Fund of Knowledge:Good  Language: Good  Akathisia:  No  Handed:  Right  AIMS (if indicated):  N/A  Assets:  Communication Skills Desire for Improvement  ADL's:  Intact  Cognition: WNL  Sleep:  ***   Assessment  Plan  The patient demonstrates the following risk factors for suicide: Chronic risk factors for suicide include: {Chronic Risk Factors for WGNFAOZ:30865784}. Acute risk factors for suicide include: {Acute Risk Factors for ONGEXBM:84132440}. Protective factors for this  patient include: {Protective Factors for Suicide NUUV:25366440}. Considering these factors, the overall suicide risk at this point appears to be {Desc; low/moderate/high:110033}. Patient {ACTION; IS/IS  EAV:[id number]}OT:21021397} appropriate for outpatient follow up.   Treatment Plan Summary: Plan as above   Neysa Hottereina Geneve Kimpel, MD 6/17/20193:22 PM

## 2018-03-01 ENCOUNTER — Ambulatory Visit (HOSPITAL_COMMUNITY): Payer: BLUE CROSS/BLUE SHIELD | Admitting: Psychiatry

## 2018-09-06 DIAGNOSIS — I10 Essential (primary) hypertension: Secondary | ICD-10-CM | POA: Diagnosis not present

## 2019-04-05 DIAGNOSIS — F3132 Bipolar disorder, current episode depressed, moderate: Secondary | ICD-10-CM | POA: Diagnosis not present

## 2019-04-05 DIAGNOSIS — I10 Essential (primary) hypertension: Secondary | ICD-10-CM | POA: Diagnosis not present

## 2019-04-05 DIAGNOSIS — Z8659 Personal history of other mental and behavioral disorders: Secondary | ICD-10-CM | POA: Diagnosis not present

## 2020-01-09 ENCOUNTER — Inpatient Hospital Stay (HOSPITAL_BASED_OUTPATIENT_CLINIC_OR_DEPARTMENT_OTHER)
Admission: EM | Admit: 2020-01-09 | Discharge: 2020-01-12 | DRG: 286 | Disposition: A | Discharge status: Home / Self Care | Payer: BC Managed Care – PPO | Attending: Cardiovascular Disease | Admitting: Cardiovascular Disease

## 2020-01-09 ENCOUNTER — Emergency Department (HOSPITAL_BASED_OUTPATIENT_CLINIC_OR_DEPARTMENT_OTHER): Payer: BC Managed Care – PPO

## 2020-01-09 ENCOUNTER — Encounter (HOSPITAL_BASED_OUTPATIENT_CLINIC_OR_DEPARTMENT_OTHER): Payer: Self-pay | Admitting: Emergency Medicine

## 2020-01-09 ENCOUNTER — Other Ambulatory Visit: Payer: Self-pay

## 2020-01-09 DIAGNOSIS — R0902 Hypoxemia: Secondary | ICD-10-CM | POA: Diagnosis not present

## 2020-01-09 DIAGNOSIS — I4510 Unspecified right bundle-branch block: Secondary | ICD-10-CM | POA: Diagnosis present

## 2020-01-09 DIAGNOSIS — I5031 Acute diastolic (congestive) heart failure: Secondary | ICD-10-CM | POA: Diagnosis present

## 2020-01-09 DIAGNOSIS — Z20822 Contact with and (suspected) exposure to covid-19: Secondary | ICD-10-CM | POA: Diagnosis present

## 2020-01-09 DIAGNOSIS — Z7982 Long term (current) use of aspirin: Secondary | ICD-10-CM | POA: Diagnosis not present

## 2020-01-09 DIAGNOSIS — R42 Dizziness and giddiness: Secondary | ICD-10-CM | POA: Diagnosis not present

## 2020-01-09 DIAGNOSIS — Z6841 Body Mass Index (BMI) 40.0 and over, adult: Secondary | ICD-10-CM

## 2020-01-09 DIAGNOSIS — R7989 Other specified abnormal findings of blood chemistry: Secondary | ICD-10-CM | POA: Diagnosis not present

## 2020-01-09 DIAGNOSIS — Z888 Allergy status to other drugs, medicaments and biological substances status: Secondary | ICD-10-CM | POA: Diagnosis not present

## 2020-01-09 DIAGNOSIS — R110 Nausea: Secondary | ICD-10-CM | POA: Diagnosis not present

## 2020-01-09 DIAGNOSIS — I248 Other forms of acute ischemic heart disease: Secondary | ICD-10-CM | POA: Diagnosis not present

## 2020-01-09 DIAGNOSIS — R778 Other specified abnormalities of plasma proteins: Secondary | ICD-10-CM | POA: Diagnosis not present

## 2020-01-09 DIAGNOSIS — I10 Essential (primary) hypertension: Secondary | ICD-10-CM | POA: Diagnosis present

## 2020-01-09 DIAGNOSIS — I200 Unstable angina: Secondary | ICD-10-CM

## 2020-01-09 DIAGNOSIS — Z79899 Other long term (current) drug therapy: Secondary | ICD-10-CM | POA: Diagnosis not present

## 2020-01-09 DIAGNOSIS — E785 Hyperlipidemia, unspecified: Secondary | ICD-10-CM | POA: Diagnosis present

## 2020-01-09 DIAGNOSIS — I214 Non-ST elevation (NSTEMI) myocardial infarction: Secondary | ICD-10-CM | POA: Diagnosis not present

## 2020-01-09 DIAGNOSIS — R1084 Generalized abdominal pain: Secondary | ICD-10-CM | POA: Diagnosis not present

## 2020-01-09 DIAGNOSIS — I5032 Chronic diastolic (congestive) heart failure: Secondary | ICD-10-CM

## 2020-01-09 DIAGNOSIS — K760 Fatty (change of) liver, not elsewhere classified: Secondary | ICD-10-CM | POA: Diagnosis not present

## 2020-01-09 DIAGNOSIS — R0789 Other chest pain: Secondary | ICD-10-CM | POA: Diagnosis not present

## 2020-01-09 DIAGNOSIS — R1010 Upper abdominal pain, unspecified: Secondary | ICD-10-CM | POA: Diagnosis not present

## 2020-01-09 DIAGNOSIS — I110 Hypertensive heart disease with heart failure: Principal | ICD-10-CM | POA: Diagnosis present

## 2020-01-09 DIAGNOSIS — R319 Hematuria, unspecified: Secondary | ICD-10-CM | POA: Diagnosis not present

## 2020-01-09 DIAGNOSIS — R52 Pain, unspecified: Secondary | ICD-10-CM | POA: Diagnosis not present

## 2020-01-09 HISTORY — DX: Chronic diastolic (congestive) heart failure: I50.32

## 2020-01-09 LAB — URINALYSIS, ROUTINE W REFLEX MICROSCOPIC
Bilirubin Urine: NEGATIVE
Glucose, UA: NEGATIVE mg/dL
Ketones, ur: NEGATIVE mg/dL
Leukocytes,Ua: NEGATIVE
Nitrite: NEGATIVE
Protein, ur: NEGATIVE mg/dL
Specific Gravity, Urine: >1.03 — ABNORMAL HIGH (ref 1.005–1.030)
pH: 5.5 (ref 5.0–8.0)

## 2020-01-09 LAB — CBC WITH DIFFERENTIAL/PLATELET
Abs Immature Granulocytes: 0.06 10*3/uL (ref 0.00–0.07)
Basophils Absolute: 0.1 10*3/uL (ref 0.0–0.1)
Basophils Relative: 0 %
Eosinophils Absolute: 0 10*3/uL (ref 0.0–0.5)
Eosinophils Relative: 0 %
HCT: 37.2 % (ref 36.0–46.0)
Hemoglobin: 12.5 g/dL (ref 12.0–15.0)
Immature Granulocytes: 1 %
Lymphocytes Relative: 12 %
Lymphs Abs: 1.5 10*3/uL (ref 0.7–4.0)
MCH: 30.5 pg (ref 26.0–34.0)
MCHC: 33.6 g/dL (ref 30.0–36.0)
MCV: 90.7 fL (ref 80.0–100.0)
Monocytes Absolute: 0.7 10*3/uL (ref 0.1–1.0)
Monocytes Relative: 5 %
Neutro Abs: 10.6 10*3/uL — ABNORMAL HIGH (ref 1.7–7.7)
Neutrophils Relative %: 82 %
Platelets: 226 10*3/uL (ref 150–400)
RBC: 4.1 MIL/uL (ref 3.87–5.11)
RDW: 13 % (ref 11.5–15.5)
WBC: 13 10*3/uL — ABNORMAL HIGH (ref 4.0–10.5)
nRBC: 0 % (ref 0.0–0.2)

## 2020-01-09 LAB — COMPREHENSIVE METABOLIC PANEL
ALT: 33 U/L (ref 0–44)
AST: 35 U/L (ref 15–41)
Albumin: 4 g/dL (ref 3.5–5.0)
Alkaline Phosphatase: 68 U/L (ref 38–126)
Anion gap: 10 (ref 5–15)
BUN: 15 mg/dL (ref 6–20)
CO2: 23 mmol/L (ref 22–32)
Calcium: 8.3 mg/dL — ABNORMAL LOW (ref 8.9–10.3)
Chloride: 101 mmol/L (ref 98–111)
Creatinine, Ser: 0.58 mg/dL (ref 0.44–1.00)
GFR calc Af Amer: >60 mL/min (ref >60)
GFR calc non Af Amer: >60 mL/min (ref >60)
Glucose, Bld: 121 mg/dL — ABNORMAL HIGH (ref 70–99)
Potassium: 3.5 mmol/L (ref 3.5–5.1)
Sodium: 134 mmol/L — ABNORMAL LOW (ref 135–145)
Total Bilirubin: 0.5 mg/dL (ref 0.3–1.2)
Total Protein: 7.7 g/dL (ref 6.5–8.1)

## 2020-01-09 LAB — URINALYSIS, MICROSCOPIC (REFLEX)

## 2020-01-09 LAB — TROPONIN I (HIGH SENSITIVITY)
Troponin I (High Sensitivity): 44 ng/L — ABNORMAL HIGH (ref <18)
Troponin I (High Sensitivity): 103 ng/L (ref <18)
Troponin I (High Sensitivity): 252 ng/L (ref <18)

## 2020-01-09 LAB — PREGNANCY, URINE: Preg Test, Ur: NEGATIVE

## 2020-01-09 LAB — RESPIRATORY PANEL BY RT PCR (FLU A&B, COVID)
Influenza A by PCR: NEGATIVE
Influenza B by PCR: NEGATIVE
SARS Coronavirus 2 by RT PCR: NEGATIVE

## 2020-01-09 LAB — LIPASE, BLOOD: Lipase: 44 U/L (ref 11–51)

## 2020-01-09 MED ORDER — PROCHLORPERAZINE EDISYLATE 10 MG/2ML IJ SOLN
10.0000 mg | Freq: Once | INTRAMUSCULAR | Status: AC
  Administered 2020-01-09: 10 mg via INTRAVENOUS
  Filled 2020-01-09: qty 2

## 2020-01-09 MED ORDER — PROMETHAZINE HCL 25 MG/ML IJ SOLN
12.5000 mg | Freq: Once | INTRAMUSCULAR | Status: AC
  Administered 2020-01-09: 12.5 mg via INTRAVENOUS
  Filled 2020-01-09: qty 1

## 2020-01-09 MED ORDER — HEPARIN (PORCINE) 25000 UT/250ML-% IV SOLN
950.0000 [IU]/h | INTRAVENOUS | Status: DC
  Administered 2020-01-09: 1150 [IU]/h via INTRAVENOUS
  Filled 2020-01-09 (×2): qty 250

## 2020-01-09 MED ORDER — MORPHINE SULFATE (PF) 4 MG/ML IV SOLN
4.0000 mg | Freq: Once | INTRAVENOUS | Status: AC
  Administered 2020-01-09: 4 mg via INTRAVENOUS
  Filled 2020-01-09: qty 1

## 2020-01-09 MED ORDER — SODIUM CHLORIDE 0.9 % IV BOLUS
500.0000 mL | Freq: Once | INTRAVENOUS | Status: AC
  Administered 2020-01-09: 500 mL via INTRAVENOUS

## 2020-01-09 MED ORDER — IOHEXOL 350 MG/ML SOLN
100.0000 mL | Freq: Once | INTRAVENOUS | Status: AC
  Administered 2020-01-09: 100 mL via INTRAVENOUS

## 2020-01-09 MED ORDER — NITROGLYCERIN 0.4 MG SL SUBL: 0.4000 mg | SUBLINGUAL_TABLET | SUBLINGUAL | Status: DC | PRN

## 2020-01-09 MED ORDER — HEPARIN BOLUS VIA INFUSION
4000.0000 [IU] | Freq: Once | INTRAVENOUS | Status: AC
  Administered 2020-01-09: 4000 [IU] via INTRAVENOUS

## 2020-01-09 MED ORDER — METHADONE HCL 10 MG PO TABS
65.0000 mg | ORAL_TABLET | Freq: Every day | ORAL | Status: DC
  Administered 2020-01-10 – 2020-01-12 (×3): 65 mg via ORAL
  Filled 2020-01-09: qty 1
  Filled 2020-01-09: qty 7
  Filled 2020-01-09: qty 1
  Filled 2020-01-09: qty 7

## 2020-01-09 MED ORDER — DIPHENHYDRAMINE HCL 50 MG/ML IJ SOLN
25.0000 mg | Freq: Once | INTRAMUSCULAR | Status: AC
  Administered 2020-01-09: 25 mg via INTRAVENOUS
  Filled 2020-01-09: qty 1

## 2020-01-09 MED ORDER — ASPIRIN 81 MG PO CHEW
324.0000 mg | CHEWABLE_TABLET | Freq: Once | ORAL | Status: AC
  Administered 2020-01-09: 324 mg via ORAL
  Filled 2020-01-09: qty 4

## 2020-01-09 MED ORDER — HYDROMORPHONE HCL 1 MG/ML IJ SOLN
1.0000 mg | Freq: Once | INTRAMUSCULAR | Status: AC
  Administered 2020-01-09: 1 mg via INTRAVENOUS
  Filled 2020-01-09: qty 1

## 2020-01-09 NOTE — ED Triage Notes (Signed)
Arrived via EMS c/o dizziness, nausea that started today while she was working outside. Also endorses L flank pain. EMS gave pt 500cc saline bolus and 4mg  zofran.

## 2020-01-09 NOTE — Progress Notes (Signed)
ANTICOAGULATION CONSULT NOTE - Initial Consult  Pharmacy Consult for heparin Indication: chest pain/ACS  Allergies  Allergen Reactions  . Zofran Other (See Comments)    headache  . Reglan [Metoclopramide] Palpitations    Patient Measurements: Height: 5\' 4"  (162.6 cm) Weight: (!) 140.6 kg (310 lb) IBW/kg (Calculated) : 54.7 Heparin Dosing Weight: 89 kg   Vital Signs: Temp: 99 F (37.2 C) (04/29 2000) Temp Source: Oral (04/29 2000) BP: 146/89 (04/29 2000) Pulse Rate: 91 (04/29 2000)  Labs: Recent Labs    01/09/20 1408 01/09/20 1519 01/09/20 1723 01/09/20 1933  HGB 12.5  --   --   --   HCT 37.2  --   --   --   PLT 226  --   --   --   CREATININE  --  0.58  --   --   TROPONINIHS  --  44* 103* 252*    Estimated Creatinine Clearance: 119.7 mL/min (by C-G formula based on SCr of 0.58 mg/dL).   Medical History: Past Medical History:  Diagnosis Date  . Hypertension   . Ovarian cyst   . Pancreatic cyst   . Pancreatitis     Medications:  (Not in a hospital admission)   Assessment: 35 YOF with presents with chest pain concerning for ACS in the setting of elevated troponins. Pharmacy consulted to start IV heparin. H/H and Plt wnl. SCr wnl.   Goal of Therapy:  Heparin level 0.3-0.7 units/ml Monitor platelets by anticoagulation protocol: Yes   Plan:  -Heparin 4000 units IV bolus then start IV heparin infusion at 1150 units/hr  -F/u 6 hr HL  -Monitor daily HL, CBC and s/s of bleeding   53, PharmD., BCPS, BCCCP Clinical Pharmacist Clinical phone for 01/09/20 until 11pm: [phone] If after 11pm, please refer to [facility] for unit-specific pharmacist

## 2020-01-09 NOTE — ED Notes (Signed)
Date and time results received: 01/09/20   Test: Trop Critical Value: 103  Name of Provider Notified: Harlene Salts, PA-C

## 2020-01-09 NOTE — ED Notes (Signed)
Date and time results received: 01/09/20 2006  Test: Troponin Critical Value: 252  Name of Provider Notified: Olevia Bowens PA  Orders Received? Or Actions Taken?: no new orders

## 2020-01-09 NOTE — ED Notes (Signed)
ERNIE Blaze FOR UPDATES ON WIFE @ [phone]

## 2020-01-09 NOTE — ED Notes (Signed)
Patient transported to X-ray 

## 2020-01-09 NOTE — ED Notes (Signed)
Date and time results received: 01/09/20 8:09 PM  Test: 3rd trop Critical Value: 252  Name of Provider Notified: provider noptified  Orders Received? Or Actions Taken?:

## 2020-01-09 NOTE — Plan of Care (Addendum)
Pt with L sided "flank" pain, dizziness, intermittent nausea for past 3 days.  Got worse today at 11 while at work outside (exertion) with associated lightheadedness.  To ED: doesn't seem to be pancreatitis like patient thought initially.  Lipase nl.  But trop has gone from 44 to 103 (3rd draw pending).  Pain improved with morphine but came back and now still present.  Given ASA.  Cards wasn't as impressed and said admit to medicine consult them if needed.  EKG not impressive.  Will put pt on SDU.  Trying SL NTG, start NTG gtt or paste if SL works.  Getting 3rd trop but if it continues to trend up then start heparin gtt.

## 2020-01-09 NOTE — ED Notes (Signed)
Pt transported to CT ?

## 2020-01-09 NOTE — ED Provider Notes (Signed)
[facility] EMERGENCY DEPARTMENT Provider Note   CSN: [id number] Arrival date & time: 01/09/20  1354     History Chief Complaint  Patient presents with  . Dizziness  . Flank Pain    Teresa Valenzuela is a 49 y.o. female history of pancreatitis, pancreatic cyst, hypertension, cholecystectomy, tubal ligation.  Patient presents today for left side pain and dizziness.  Patient reports over the last 3 days she has had a moderate intensity sharp pain to her left side constant nonradiating no clear aggravating or alleviating factors, she reports this pain is consistent with her history of chronic pancreatitis and feels that this is due to a flare recently.  She reports intermittent nausea associated with her left side pain denies any vomiting, fever or diarrhea.  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  It is very warm outside today.  At 11:00 while working she became tired and lightheaded, she returned to her truck and sat down in the seat and called her boss.  Symptoms did not improve and EMS was called to bring patient to this ER.  She reports that she had one episode of dizziness like this in the past when she was first diagnosed with pancreatitis.  She describes her dizziness as a lightheaded and room spinning sensation.  Denies fever/chills, headache, vision changes, difficulty speaking, neck stiffness, chest pain/shortness of breath, cough/hemoptysis, vomiting, diarrhea, dysuria/hematuria, extremity swelling/color change, numbness/tingling, weakness, fall/injury or any additional concerns.  HPI     Past Medical History:  Diagnosis Date  . Hypertension   . Ovarian cyst   . Pancreatic cyst   . Pancreatitis     Patient Active Problem List   Diagnosis Date Noted  . Unstable angina (HCC) 01/09/2020  . Chest pain 06/25/2014  . Essential hypertension, benign 06/25/2014  . Hypokalemia  06/25/2014    Past Surgical History:  Procedure Laterality Date  . CHOLECYSTECTOMY    . TUBAL LIGATION       OB History   No obstetric history on file.     Family History  Problem Relation Age of Onset  . CAD Neg Hx     Social History   Tobacco Use  . Smoking status: Never Smoker  . Smokeless tobacco: Never Used  Substance Use Topics  . Alcohol use: No  . Drug use: Yes    Types: Marijuana    Home Medications Prior to Admission medications   Medication Sig Start Date End Date Taking? Authorizing Provider  amLODipine (NORVASC) 10 MG tablet Take 10 mg by mouth daily.   Yes [provider]  aspirin 81 MG tablet Take 81 mg by mouth daily.   Yes [provider]  methadone (DOLOPHINE) 10 MG tablet Take 65 mg by mouth every 8 (eight) hours.    Yes [provider]  ibuprofen (ADVIL,MOTRIN) 200 MG tablet Take 200 mg every 6 (six) hours as needed by mouth.    [provider]  oseltamivir (TAMIFLU) 75 MG capsule Take 1 capsule (75 mg total) by mouth every 12 (twelve) hours. 12/08/17   Tegeler, Canary Brim, MD  promethazine (PHENERGAN) 25 MG tablet Take 1 tablet (25 mg total) by mouth every 6 (six) hours as needed for nausea or vomiting. 03/29/17   Khatri, Hina, PA-C  promethazine (PHENERGAN) 25 MG tablet Take 1 tablet (25 mg total) by mouth every 6 (six) hours as needed for nausea or  vomiting. 12/08/17   Tegeler, Canary Brimhristopher J, MD    Allergies    Zofran and Reglan [metoclopramide]  Review of Systems   Review of Systems Ten systems are reviewed and are negative for acute change except as noted in the HPI  Physical Exam Updated Vital Signs BP 137/84   Pulse 84   Temp 99 F (37.2 C) (Oral)   Resp 15   Ht 5\' 4"  (1.626 m)   Wt (!) 140.6 kg   SpO2 94%   BMI 53.21 kg/m   Physical Exam Constitutional:      General: She is not in acute distress.    Appearance: Normal appearance. She is well-developed. She is obese. She is not ill-appearing  or diaphoretic.  HENT:     Head: Normocephalic and atraumatic.     Right Ear: External ear normal.     Left Ear: External ear normal.     Nose: Nose normal.  Eyes:     General: Vision grossly intact. Gaze aligned appropriately.     Extraocular Movements: Extraocular movements intact.     Conjunctiva/sclera: Conjunctivae normal.     Pupils: Pupils are equal, round, and reactive to light.     Comments: No pain with extraocular motion Visual fields grossly intact bilaterally  Neck:     Trachea: Trachea and phonation normal. No tracheal deviation.  Cardiovascular:     Rate and Rhythm: Regular rhythm. Tachycardia present.     Pulses: Normal pulses.     Heart sounds: Normal heart sounds.  Pulmonary:     Effort: Pulmonary effort is normal. No respiratory distress.     Breath sounds: Normal breath sounds.  Abdominal:     General: There is no distension.     Palpations: Abdomen is soft.     Tenderness: There is no abdominal tenderness. There is no guarding or rebound.  Musculoskeletal:        General: No tenderness. Normal range of motion.     Cervical back: Normal range of motion.     Right lower leg: No edema.     Left lower leg: No edema.  Skin:    General: Skin is warm and dry.  Neurological:     Mental Status: She is alert.     GCS: GCS eye subscore is 4. GCS verbal subscore is 5. GCS motor subscore is 6.     Comments: Speech is clear and goal oriented, follows commands Major Cranial nerves without deficit, no facial droop Normal strength in upper and lower extremities bilaterally including dorsiflexion and plantar flexion, strong and equal grip strength Sensation normal to light and sharp touch Moves extremities without ataxia, coordination intact Normal finger to nose and rapid alternating movements No pronator drift  Psychiatric:        Behavior: Behavior normal.    ED Results / Procedures / Treatments   Labs (all labs ordered are listed, but only abnormal results are  displayed) Labs Reviewed  CBC WITH DIFFERENTIAL/PLATELET - Abnormal; Notable for the following components:      Result Value   WBC 13.0 (*)    Neutro Abs 10.6 (*)    All other components within normal limits  URINALYSIS, ROUTINE W REFLEX MICROSCOPIC - Abnormal; Notable for the following components:   Specific Gravity, Urine >1.030 (*)    Hgb urine dipstick SMALL (*)    All other components within normal limits  URINALYSIS, MICROSCOPIC (REFLEX) - Abnormal; Notable for the following components:   Bacteria, UA FEW (*)  All other components within normal limits  COMPREHENSIVE METABOLIC PANEL - Abnormal; Notable for the following components:   Sodium 134 (*)    Glucose, Bld 121 (*)    Calcium 8.3 (*)    All other components within normal limits  TROPONIN I (HIGH SENSITIVITY) - Abnormal; Notable for the following components:   Troponin I (High Sensitivity) 44 (*)    All other components within normal limits  TROPONIN I (HIGH SENSITIVITY) - Abnormal; Notable for the following components:   Troponin I (High Sensitivity) 103 (*)    All other components within normal limits  TROPONIN I (HIGH SENSITIVITY) - Abnormal; Notable for the following components:   Troponin I (High Sensitivity) 252 (*)    All other components within normal limits  RESPIRATORY PANEL BY RT PCR (FLU A&B, COVID)  PREGNANCY, URINE  LIPASE, BLOOD  HEPARIN LEVEL (UNFRACTIONATED)    EKG EKG Interpretation  Date/Time:  Thursday January 09 2020 14:19:25 EDT Ventricular Rate:  106 PR Interval:    QRS Duration: 105 QT Interval:  371 QTC Calculation: 493 R Axis:   62 Text Interpretation: Sinus tachycardia RSR' in V1 or V2, probably normal variant Borderline prolonged QT interval No STEMI Confirmed by Alvester Chou [phone]) on 01/09/2020 2:53:25 PM   Radiology DG Chest 2 View  Result Date: 01/09/2020 CLINICAL DATA:  Left side pain. CT dizzy, lightheaded and felt like she was going to pass out. Nauseated. EXAM: CHEST  - 2 VIEW COMPARISON:  12/08/2017 FINDINGS: There is faint focal opacity involving the lingula, best seen on the LATERAL view. No pulmonary edema. No pleural effusions. Heart size is normal. IMPRESSION: Atelectasis or infiltrate in the lingula. Electronically Signed   By: Norva Pavlov M.D.   On: 01/09/2020 14:56   CT Angio Chest PE W and/or Wo Contrast  Result Date: 01/09/2020 CLINICAL DATA:  Nausea beginning today while working outside with dizziness and left flank pain. Possible pulmonary embolism. EXAM: CT ANGIOGRAPHY CHEST WITH CONTRAST TECHNIQUE: Multidetector CT imaging of the chest was performed using the standard protocol during bolus administration of intravenous contrast. Multiplanar CT image reconstructions and MIPs were obtained to evaluate the vascular anatomy. CONTRAST:  OMNIPAQUE IOHEXOL 350 MG/ML SOLN COMPARISON:  12/08/2017 FINDINGS: Cardiovascular: Borderline cardiomegaly. Thoracic aorta is normal in caliber without aneurysm or dissection. Pulmonary arterial system is adequately opacified without evidence of pulmonary emboli. Remaining vascular structures are unremarkable. Mediastinum/Nodes: No evidence of mediastinal or hilar adenopathy. Remaining mediastinal structures are normal. Lungs/Pleura: Lungs are adequately inflated without focal airspace consolidation or effusion. Airways are normal. Upper Abdomen: Previous cholecystectomy.  No acute findings. Musculoskeletal: No acute findings. Review of the MIP images confirms the above findings. IMPRESSION: No acute cardiopulmonary disease and no evidence of pulmonary embolism. Electronically Signed   By: Elberta Fortis M.D.   On: 01/09/2020 16:59   CT Renal Stone Study  Result Date: 01/09/2020 CLINICAL DATA:  Left-sided flank pain EXAM: CT ABDOMEN AND PELVIS WITHOUT CONTRAST TECHNIQUE: Multidetector CT imaging of the abdomen and pelvis was performed following the standard protocol without IV contrast. COMPARISON:  12/08/2017 FINDINGS:  Lower chest: Lung bases are clear bilaterally. Hepatobiliary: Fatty infiltration of the liver is noted. The gallbladder has been surgically removed. Pancreas: Unremarkable. No pancreatic ductal dilatation or surrounding inflammatory changes. Spleen: Scattered calcified granulomas are again noted and stable. Adrenals/Urinary Tract: Adrenal glands are within normal limits. Kidneys are well visualized bilaterally. No renal calculi or obstructive changes are seen. The bladder is partially decompressed. Stomach/Bowel: Fecal material  is noted throughout the colon without obstructive change. No inflammatory changes are noted. The appendix is within normal limits. No obstructive changes are seen in the small bowel. The stomach is within normal limits. Vascular/Lymphatic: No significant vascular findings are present. No enlarged abdominal or pelvic lymph nodes. Reproductive: Uterus is well visualized with prominent endometrial canal likely related to the patient's current menstrual status. Correlate with clinical history. Left adnexal cyst is again noted stable from prior CT examination. Other: No abdominal wall hernia or abnormality. No abdominopelvic ascites. Musculoskeletal: Mild degenerative changes of lumbar spine are noted. No compression deformities are seen. IMPRESSION: Fatty liver. No renal calculi or obstructive changes are noted. Prominent endometrial canal likely related to the current menstrual status. Correlate with the patient's clinical menstrual history. Electronically Signed   By: Alcide Clever M.D.   On: 01/09/2020 15:56    Procedures .Critical Care Performed by: Bill Salinas, PA-C Authorized by: Bill Salinas, PA-C   Critical care provider statement:    Critical care time (minutes):  35   Critical care was necessary to treat or prevent imminent or life-threatening deterioration of the following conditions: Elevated Troponin.   Critical care was time spent personally by me on the  following activities:  Discussions with consultants, evaluation of patient's response to treatment, examination of patient, ordering and performing treatments and interventions, ordering and review of laboratory studies, ordering and review of radiographic studies, pulse oximetry, re-evaluation of patient's condition, obtaining history from patient or surrogate, review of old charts and development of treatment plan with patient or surrogate   (including critical care time)  Medications Ordered in ED Medications  nitroGLYCERIN (NITROSTAT) SL tablet 0.4 mg (has no administration in time range)  heparin ADULT infusion 100 units/mL (25000 units/212mL sodium chloride 0.45%) (1,150 Units/hr Intravenous New Bag/Given 01/09/20 2030)  sodium chloride 0.9 % bolus 500 mL (0 mLs Intravenous Stopped 01/09/20 1714)  morphine 4 MG/ML injection 4 mg (4 mg Intravenous Given 01/09/20 1454)  promethazine (PHENERGAN) injection 12.5 mg (12.5 mg Intravenous Given 01/09/20 1454)  iohexol (OMNIPAQUE) 350 MG/ML injection 100 mL (100 mLs Intravenous Contrast Given 01/09/20 1637)  HYDROmorphone (DILAUDID) injection 1 mg (1 mg Intravenous Given 01/09/20 1714)  aspirin chewable tablet 324 mg (324 mg Oral Given 01/09/20 1808)  prochlorperazine (COMPAZINE) injection 10 mg (10 mg Intravenous Given 01/09/20 2002)  diphenhydrAMINE (BENADRYL) injection 25 mg (25 mg Intravenous Given 01/09/20 2002)  heparin bolus via infusion 4,000 Units (4,000 Units Intravenous Bolus from Bag 01/09/20 2030)    ED Course  I have reviewed the triage vital signs and the nursing notes.  Pertinent labs & imaging results that were available during my care of the patient were reviewed by me and considered in my medical decision making (see chart for details).  Clinical Course as of Jan 09 2104  Thu Jan 09, 2020  1700 Dr. 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  On arrival she is tired appearing, mildly  tachycardic, no acute distress.  Cranial nerves intact, no meningeal signs, mildly tachycardic with regular rhythm, lungs clear, abdomen soft nontender, neurovascular intact to all 4 extremities without evidence of DVT, she has reproducible pain to the left flank with light palpation no overlying skin changes.  Will obtain CBC, CMP, lipase, urinalysis for evaluation of flank pain.  Additionally will add EKG and chest x-ray for evaluation of lightheadedness and mild tachycardia.  Patient denies any shortness of breath or chest pain.  Patient received Zofran 4 mg by EMS along with fluid bolus. - EKG: Sinus tachycardia RSR' in V1 or V2, probably normal variant Borderline prolonged QT interval No STEMI Confirmed by Octaviano Glow [phone]) on 01/09/2020 2:53:25 PM; QTC 493, discussed with Dr. Sunday Shams, will give 12.5 milligrams Phenergan.  4 mg morphine ordered for pain control.  I have reviewed and interpreted the following labs.  CBC shows mild leukocytosis of 13.0 with left shift, no evidence of anemia.  Urinalysis shows increased specific gravity and small hemoglobin no evidence of infection.  Microscopic urinalysis reveals few bacteria, 6-10 RBCs.  Pregnancy test negative.  Based on UA above higher suspicion for kidney stone disease at this time.  Will obtain CT renal stone study for evaluation. --------------- I have reviewed and interpreted the following labs.  CMP has resulted, no emergent electrolyte derangement, evidence of kidney injury or acute elevation of LFTs.  Lipase within normal limits and lower suspicion for  pancreatitis at this time.  CXR:  IMPRESSION:  Atelectasis or infiltrate in the lingula.  I have personally reviewed patient's chest x-ray and agree with radiologist interpretation.  CT Renal Stone Study:  IMPRESSION:  Fatty liver.    No renal calculi or obstructive changes are noted.    Prominent endometrial canal likely related to the current menstrual  status. Correlate with the patient's clinical menstrual history.  ---- Discussed case and imaging with Dr. Billy Fischer, concern for atelectasis and infiltrate in the lingula may be PE given tachycardia and lightheadedness today.  CT angio PE study ordered.  Additionally added high-sensitivity troponin.  Patient reassessed resting with a wet washcloth on her head, vital signs stable on room air. - CT Angio PE Study:  IMPRESSION:  No acute cardiopulmonary disease and no evidence of pulmonary  embolism.   HS-Troponin: 44  On reassessment patient endorses return of flank pain and nausea, lightheadedness has improved.  Dilaudid has been ordered.  Discussed case with Dr. Billy Fischer, will obtain delta troponin. ------- Delta troponin elevated at 103.  Consult was called to cardiology, discussed case with Dr. Percival Spanish at 6:22 PM.  Advises hospitalist admission, hospitalist team to consult cardiology if needed.  Patient reassessed resting comfortably no acute distress, full dose aspirin has been given.  She states understanding of plan of care and is agreeable for admission.  Screening Covid test ordered.  Consult placed to hospitalist for admission. ----------- Patient seen and evaluated by Dr. Billy Fischer.  Suspect patient may have had arrhythmia while working today as etiology of her symptoms.  She reported now that while she was out working she also had palpitations and diaphoresis with her lightheadedness.  Palpitations resolved. - 7:33 PM: Discussed case with hospitalist Dr. Alcario Drought, advises sublingual nitro, if third troponin is more  elevated to start heparin drip. - Third troponin is 252, we discussed case with Dr. Billy Fischer, heparin drip ordered.  Patient admitted to hospitalist service. - Patient reassessed resting comfortably no acute distress states understanding of care plan has no further questions.  Vital  signs stable on room air.   Note: Portions of this report may have been transcribed using voice recognition software. Every effort was made to ensure accuracy; however, inadvertent computerized transcription errors may still be present. Final Clinical Impression(s) / ED Diagnoses Final diagnoses:  Unstable angina (HCC)  Elevated troponin    Rx / DC Orders ED Discharge Orders    None       Elizabeth Palau 01/09/20 2105    Alvira Monday, MD 01/11/20 1437

## 2020-01-10 ENCOUNTER — Observation Stay (HOSPITAL_COMMUNITY): Payer: BC Managed Care – PPO

## 2020-01-10 ENCOUNTER — Encounter (HOSPITAL_COMMUNITY)
Admission: EM | Disposition: A | Discharge status: Home / Self Care | Payer: Self-pay | Source: Home / Self Care | Attending: Cardiovascular Disease

## 2020-01-10 DIAGNOSIS — I110 Hypertensive heart disease with heart failure: Secondary | ICD-10-CM | POA: Diagnosis not present

## 2020-01-10 DIAGNOSIS — I248 Other forms of acute ischemic heart disease: Secondary | ICD-10-CM | POA: Insufficient documentation

## 2020-01-10 DIAGNOSIS — I200 Unstable angina: Secondary | ICD-10-CM | POA: Diagnosis not present

## 2020-01-10 DIAGNOSIS — I214 Non-ST elevation (NSTEMI) myocardial infarction: Secondary | ICD-10-CM

## 2020-01-10 DIAGNOSIS — R778 Other specified abnormalities of plasma proteins: Secondary | ICD-10-CM

## 2020-01-10 HISTORY — PX: LEFT HEART CATH AND CORONARY ANGIOGRAPHY: CATH118249

## 2020-01-10 LAB — CBC
HCT: 41.2 % (ref 36.0–46.0)
Hemoglobin: 13.3 g/dL (ref 12.0–15.0)
MCH: 29.6 pg (ref 26.0–34.0)
MCHC: 32.3 g/dL (ref 30.0–36.0)
MCV: 91.8 fL (ref 80.0–100.0)
Platelets: 253 10*3/uL (ref 150–400)
RBC: 4.49 MIL/uL (ref 3.87–5.11)
RDW: 13.1 % (ref 11.5–15.5)
WBC: 9.8 10*3/uL (ref 4.0–10.5)
nRBC: 0 % (ref 0.0–0.2)

## 2020-01-10 LAB — URINALYSIS, ROUTINE W REFLEX MICROSCOPIC
Bacteria, UA: NONE SEEN
Bilirubin Urine: NEGATIVE
Glucose, UA: NEGATIVE mg/dL
Ketones, ur: NEGATIVE mg/dL
Leukocytes,Ua: NEGATIVE
Nitrite: NEGATIVE
Protein, ur: NEGATIVE mg/dL
Specific Gravity, Urine: 1.013 (ref 1.005–1.030)
pH: 5 (ref 5.0–8.0)

## 2020-01-10 LAB — CREATININE, SERUM
Creatinine, Ser: 0.79 mg/dL (ref 0.44–1.00)
GFR calc Af Amer: >60 mL/min (ref >60)
GFR calc non Af Amer: >60 mL/min (ref >60)

## 2020-01-10 LAB — HEPARIN LEVEL (UNFRACTIONATED): Heparin Unfractionated: 0.95 IU/mL — ABNORMAL HIGH (ref 0.30–0.70)

## 2020-01-10 LAB — TROPONIN I (HIGH SENSITIVITY): Troponin I (High Sensitivity): 708 ng/L (ref <18)

## 2020-01-10 SURGERY — LEFT HEART CATH AND CORONARY ANGIOGRAPHY
Anesthesia: LOCAL

## 2020-01-10 MED ORDER — SODIUM CHLORIDE 0.9 % WEIGHT BASED INFUSION
1.0000 mL/kg/h | INTRAVENOUS | Status: DC
  Filled 2020-01-10: qty 1000

## 2020-01-10 MED ORDER — SODIUM CHLORIDE 0.9% FLUSH
3.0000 mL | Freq: Two times a day (BID) | INTRAVENOUS | Status: DC
  Administered 2020-01-11: 3 mL via INTRAVENOUS
  Filled 2020-01-10: qty 3

## 2020-01-10 MED ORDER — NITROGLYCERIN 2 % TD OINT
1.0000 [in_us] | TOPICAL_OINTMENT | Freq: Four times a day (QID) | TRANSDERMAL | Status: DC
  Administered 2020-01-10 – 2020-01-12 (×6): 1 [in_us] via TOPICAL
  Filled 2020-01-10: qty 30
  Filled 2020-01-10: qty 1

## 2020-01-10 MED ORDER — AMLODIPINE BESYLATE 5 MG PO TABS
5.0000 mg | ORAL_TABLET | Freq: Every day | ORAL | Status: DC
  Administered 2020-01-10 – 2020-01-12 (×3): 5 mg via ORAL
  Filled 2020-01-10 (×3): qty 1

## 2020-01-10 MED ORDER — PROMETHAZINE HCL 25 MG/ML IJ SOLN
12.5000 mg | Freq: Once | INTRAMUSCULAR | Status: AC
  Administered 2020-01-10: 12.5 mg via INTRAVENOUS
  Filled 2020-01-10: qty 1

## 2020-01-10 MED ORDER — LABETALOL HCL 5 MG/ML IV SOLN: 10.0000 mg | INTRAVENOUS | Status: AC | PRN

## 2020-01-10 MED ORDER — SODIUM CHLORIDE 0.9 % IV SOLN: INTRAVENOUS | Status: AC

## 2020-01-10 MED ORDER — FENTANYL CITRATE (PF) 100 MCG/2ML IJ SOLN
INTRAMUSCULAR | Status: DC | PRN
  Administered 2020-01-10: 25 ug via INTRAVENOUS

## 2020-01-10 MED ORDER — IOHEXOL 350 MG/ML SOLN
INTRAVENOUS | Status: DC | PRN
  Administered 2020-01-10: 85 mL

## 2020-01-10 MED ORDER — LIDOCAINE HCL (PF) 1 % IJ SOLN
INTRAMUSCULAR | Status: DC | PRN
  Administered 2020-01-10: 2 mL

## 2020-01-10 MED ORDER — HEPARIN SODIUM (PORCINE) 1000 UNIT/ML IJ SOLN
INTRAMUSCULAR | Status: DC | PRN
  Administered 2020-01-10: 7000 [IU] via INTRAVENOUS

## 2020-01-10 MED ORDER — SODIUM CHLORIDE 0.9% FLUSH
3.0000 mL | Freq: Two times a day (BID) | INTRAVENOUS | Status: DC
  Administered 2020-01-11 – 2020-01-12 (×3): 3 mL via INTRAVENOUS

## 2020-01-10 MED ORDER — SODIUM CHLORIDE 0.9 % IV SOLN: 250.0000 mL | INTRAVENOUS | Status: DC | PRN

## 2020-01-10 MED ORDER — SODIUM CHLORIDE 0.9 % WEIGHT BASED INFUSION
3.0000 mL/kg/h | INTRAVENOUS | Status: DC
  Administered 2020-01-10: 3 mL/kg/h via INTRAVENOUS
  Filled 2020-01-10: qty 1000

## 2020-01-10 MED ORDER — FENTANYL CITRATE (PF) 100 MCG/2ML IJ SOLN
INTRAMUSCULAR | Status: AC
  Filled 2020-01-10: qty 2

## 2020-01-10 MED ORDER — HEPARIN (PORCINE) IN NACL 1000-0.9 UT/500ML-% IV SOLN
INTRAVENOUS | Status: AC
  Filled 2020-01-10: qty 1000

## 2020-01-10 MED ORDER — HEPARIN SODIUM (PORCINE) 1000 UNIT/ML IJ SOLN
INTRAMUSCULAR | Status: AC
  Filled 2020-01-10: qty 1

## 2020-01-10 MED ORDER — SODIUM CHLORIDE 0.9 % WEIGHT BASED INFUSION
3.0000 mL/kg/h | INTRAVENOUS | Status: DC
  Filled 2020-01-10: qty 1000

## 2020-01-10 MED ORDER — VERAPAMIL HCL 2.5 MG/ML IV SOLN
INTRAVENOUS | Status: DC | PRN
  Administered 2020-01-10: 10 mL via INTRA_ARTERIAL

## 2020-01-10 MED ORDER — ONDANSETRON 4 MG PO TBDP
4.0000 mg | ORAL_TABLET | Freq: Once | ORAL | Status: DC
  Filled 2020-01-10: qty 1

## 2020-01-10 MED ORDER — LIDOCAINE HCL (PF) 1 % IJ SOLN
INTRAMUSCULAR | Status: AC
  Filled 2020-01-10: qty 30

## 2020-01-10 MED ORDER — HYDRALAZINE HCL 20 MG/ML IJ SOLN: 10.0000 mg | INTRAMUSCULAR | Status: AC | PRN

## 2020-01-10 MED ORDER — SODIUM CHLORIDE 0.9% FLUSH: 3.0000 mL | INTRAVENOUS | Status: DC | PRN

## 2020-01-10 MED ORDER — ASPIRIN 81 MG PO CHEW: 81.0000 mg | CHEWABLE_TABLET | ORAL | Status: DC

## 2020-01-10 MED ORDER — ASPIRIN 81 MG PO CHEW
81.0000 mg | CHEWABLE_TABLET | ORAL | Status: AC
  Administered 2020-01-10: 81 mg via ORAL
  Filled 2020-01-10: qty 1

## 2020-01-10 MED ORDER — ACETAMINOPHEN 500 MG PO TABS
1000.0000 mg | ORAL_TABLET | Freq: Once | ORAL | Status: AC
  Administered 2020-01-10: 1000 mg via ORAL
  Filled 2020-01-10: qty 2

## 2020-01-10 MED ORDER — MIDAZOLAM HCL 2 MG/2ML IJ SOLN
INTRAMUSCULAR | Status: AC
  Filled 2020-01-10: qty 2

## 2020-01-10 MED ORDER — FUROSEMIDE 10 MG/ML IJ SOLN
INTRAMUSCULAR | Status: DC | PRN
  Administered 2020-01-10: 40 mg via INTRAVENOUS

## 2020-01-10 MED ORDER — ACETAMINOPHEN 325 MG PO TABS
650.0000 mg | ORAL_TABLET | ORAL | Status: DC | PRN
  Administered 2020-01-11 (×3): 650 mg via ORAL
  Filled 2020-01-10 (×3): qty 2

## 2020-01-10 MED ORDER — HEPARIN SODIUM (PORCINE) 5000 UNIT/ML IJ SOLN
5000.0000 [IU] | Freq: Three times a day (TID) | INTRAMUSCULAR | Status: DC
  Administered 2020-01-10 – 2020-01-12 (×3): 5000 [IU] via SUBCUTANEOUS
  Filled 2020-01-10 (×3): qty 1

## 2020-01-10 MED ORDER — VERAPAMIL HCL 2.5 MG/ML IV SOLN
INTRAVENOUS | Status: AC
  Filled 2020-01-10: qty 2

## 2020-01-10 MED ORDER — AMLODIPINE BESYLATE 10 MG PO TABS: 10.0000 mg | ORAL_TABLET | Freq: Every day | ORAL | Status: DC

## 2020-01-10 MED ORDER — FUROSEMIDE 10 MG/ML IJ SOLN
INTRAMUSCULAR | Status: AC
  Filled 2020-01-10: qty 4

## 2020-01-10 MED ORDER — MIDAZOLAM HCL 2 MG/2ML IJ SOLN
INTRAMUSCULAR | Status: DC | PRN
  Administered 2020-01-10: 1 mg via INTRAVENOUS

## 2020-01-10 MED ORDER — HEPARIN (PORCINE) IN NACL 1000-0.9 UT/500ML-% IV SOLN
INTRAVENOUS | Status: DC | PRN
  Administered 2020-01-10 (×2): 500 mL

## 2020-01-10 SURGICAL SUPPLY — 11 items
CATH INFINITI 5FR ANG PIGTAIL (CATHETERS) ×2 IMPLANT
CATH OPTITORQUE TIG 4.0 5F (CATHETERS) ×2 IMPLANT
DEVICE RAD COMP TR BAND LRG (VASCULAR PRODUCTS) ×2 IMPLANT
GLIDESHEATH SLEND SS 6F .021 (SHEATH) ×2 IMPLANT
GUIDEWIRE INQWIRE 1.5J.035X260 (WIRE) ×1 IMPLANT
INQWIRE 1.5J .035X260CM (WIRE) ×2
KIT HEART LEFT (KITS) ×2 IMPLANT
PACK CARDIAC CATHETERIZATION (CUSTOM PROCEDURE TRAY) ×2 IMPLANT
SHEATH PROBE COVER 6X72 (BAG) ×2 IMPLANT
TRANSDUCER W/STOPCOCK (MISCELLANEOUS) ×2 IMPLANT
TUBING CIL FLEX 10 FLL-RA (TUBING) ×2 IMPLANT

## 2020-01-10 NOTE — Progress Notes (Signed)
Received a call from Dr. Donnald Garre in the [facility] emergency department.  Mrs. Ladson has been awaiting for a bed at [facility] since yesterday. The patient continues to have some vague left-sided chest discomfort and nausea.  ECG is low risk without ischemic changes, but the cardiac troponin has increased further to 700.  She is on intravenous heparin.  Vital signs are stable. Will add on to the list for cardiac catheterization and possible revascularization today.  Dr. Clarice Pole will discuss the procedure with the patient to obtain preliminary consent.

## 2020-01-10 NOTE — Plan of Care (Signed)
  Problem: Education: Goal: Knowledge of General Education information will improve Description: Including pain rating scale, medication(s)/side effects and non-pharmacologic comfort measures Outcome: Progressing   Problem: Health Behavior/Discharge Planning: Goal: Ability to manage health-related needs will improve Outcome: Progressing   Problem: Clinical Measurements: Goal: Ability to maintain clinical measurements within normal limits will improve Outcome: Progressing   Problem: Clinical Measurements: Goal: Cardiovascular complication will be avoided Outcome: Progressing   

## 2020-01-10 NOTE — ED Notes (Signed)
Informed by EDP that patient being placed on Cardiac Cath Schedule for today, in to make patient immediately NPO, pt was beginning to eat a light breakfast per EDP orders, PT NPO SINCE [id number], pt instructed not to eat or drink for cardiac cath procedure, pt understands.

## 2020-01-10 NOTE — H&P (Signed)
Cardiology Admission History and Physical:   Patient ID: Teresa Valenzuela MRN: [id number]; DOB: 1971-06-10   Admission date: 01/09/2020  Primary Care Provider: Patient, No Pcp Per Primary Cardiologist: No primary care provider on file. New Primary Electrophysiologist:  None   Chief Complaint: Chest discomfort  Patient Profile:   Teresa Valenzuela is a 49 y.o. female with a history of systemic hypertension, morbid obesity, remote history of gallstone pancreatitis/pancreatic cyst and cholecystectomy, presenting with unrelenting chest discomfort for about 24 hours.  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  About a week ago she had an unusual sensation of aching and weakness in her left arm that resolved spontaneously after about an hour.  A couple of days ago, for the first time in a few weeks she had to perform relatively heavy physical exertion on the road construction site.  Yesterday, while at work, she felt a sensation of discomfort in her upper chest bilaterally and initially blamed it on the physical exertion.  However the symptoms worsened and discomfort began to radiate down both arms, she developed dizziness and lightheadedness and had to sit down to avoid losing consciousness.  She subsequently [facility] where serial cardiac enzymes have shown a steadily increasing pattern, although her ECG does not show acute ischemic changes.  CT angiogram was negative for pulmonary embolism.  She continues to have very slight discomfort in the infraclavicular area bilaterally, not associated with movement or position.  She denies any dyspnea at rest and is lying fully flat in bed without respiratory difficulty.   She denies syncope, palpitations, orthopnea, PND, lower extremity edema, focal neurological complaints or intermittent claudication.  She has not had nausea, vomiting, abdominal pain or diarrhea, cough, wheezing or  hemoptysis.  She does not smoke cigarettes, she will occasionally smoke marijuana.  She takes antihypertensive medications reports her blood pressure generally well controlled.  She is on chronic methadone therapy.  Her mother is healthy at age 50.  She does not know about the health issues on her father's side of the family.  She has not had a recent lipid profile, but in 2011 her LDL was 91 and her HDL was 30.  Nonfasting glucose was 121.  High-sensitivity troponin levels have increased from 44 on presentation yesterday afternoon to 700 this morning.   Although the CT angiogram was not a coronary dedicated/gated study, it is notable for the absence of atherosclerotic calcifications in the aorta, the aortic arch vessels or the coronary arteries.   Past Medical History:  Diagnosis Date  . Hypertension   . Ovarian cyst   . Pancreatic cyst   . Pancreatitis     Past Surgical History:  Procedure Laterality Date  . CHOLECYSTECTOMY    . TUBAL LIGATION       Medications Prior to Admission: Prior to Admission medications   Medication Sig Start Date End Date Taking? Authorizing Provider  aspirin 81 MG tablet Take 81 mg by mouth daily.   Yes [provider]  methadone (DOLOPHINE) 10 MG tablet Take 65 mg by mouth daily.    Yes [provider]  promethazine (PHENERGAN) 25 MG tablet TAKE 1 TABLET BY MOUTH EVERY 6 HOURS AS NEEDED. 03/10/15  Yes [provider]  amLODipine (NORVASC) 5 MG tablet Take 10 mg by mouth daily.  08/05/19   [provider]     Allergies:    Allergies  Allergen Reactions  . Zofran Other (  See Comments)    headache  . Reglan [Metoclopramide] Palpitations    Social History:   Social History   Socioeconomic History  . Marital status: Married    Spouse name: Not on file  . Number of children: Not on file  . Years of education: Not on file  . Highest education level: Not on file  Occupational History  . Not on file  Tobacco  Use  . Smoking status: Never Smoker  . Smokeless tobacco: Never Used  Substance and Sexual Activity  . Alcohol use: No  . Drug use: Yes    Types: Marijuana  . Sexual activity: Not on file  Other Topics Concern  . Not on file  Social History Narrative  . Not on file   Social Determinants of Health   Financial Resource Strain:   . Difficulty of Paying Living Expenses:   Food Insecurity:   . Worried About Programme researcher, broadcasting/film/video in the Last Year:   . Barista in the Last Year:   Transportation Needs:   . Freight forwarder (Medical):   Marland Kitchen Lack of Transportation (Non-Medical):   Physical Activity:   . Days of Exercise per Week:   . Minutes of Exercise per Session:   Stress:   . Feeling of Stress :   Social Connections:   . Frequency of Communication with Friends and Family:   . Frequency of Social Gatherings with Friends and Family:   . Attends Religious Services:   . Active Member of Clubs or Organizations:   . Attends Banker Meetings:   Marland Kitchen Marital Status:   Intimate Partner Violence:   . Fear of Current or Ex-Partner:   . Emotionally Abused:   Marland Kitchen Physically Abused:   . Sexually Abused:     Family History:   The patient's family history is negative for CAD.    ROS:  Please see the history of present illness.  All other ROS reviewed and negative.     Physical Exam/Data:   Vitals:   01/10/20 1130 01/10/20 1239 01/10/20 1300 01/10/20 1330  BP: 131/77 111/76 127/79 128/79  Pulse: 69 72    Resp: 10 14 10    Temp:      TempSrc:      SpO2: 92% 98%    Weight:      Height:        Intake/Output Summary (Last 24 hours) at 01/10/2020 1442 Last data filed at 01/09/2020 1714 Gross per 24 hour  Intake 480.51 ml  Output --  Net 480.51 ml   Last 3 Weights 01/09/2020 12/08/2017 07/25/2017  Weight (lbs) 310 lb 270 lb 265 lb  Weight (kg) 140.615 kg 122.471 kg 120.203 kg     Body mass index is 53.21 kg/m.  General:  Well nourished, well developed, in no  acute distress.  Superobese HEENT: normal Lymph: no adenopathy Neck: Unable to see JVD Endocrine:  No thryomegaly Vascular: No carotid bruits; FA pulses 2+ bilaterally without bruits  Cardiac:  normal S1, S2; RRR; no murmur  Lungs:  clear to auscultation bilaterally, no wheezing, rhonchi or rales  Abd: soft, nontender, no hepatomegaly  Ext: no edema Musculoskeletal:  No deformities, BUE and BLE strength normal and equal 2+ pulses in the radials and ulnar arteries and dorsalis pedis bilaterally. Skin: warm and dry  Neuro:  CNs 2-12 intact, no focal abnormalities noted Psych:  Normal affect    EKG:  The ECG that was done 01/09/2020 was personally reviewed  and demonstrates sinus rhythm, incomplete right bundle branch block, subtle nonspecific ST segment depression inferolaterally.  2 ECGs performed today at 0900 hrs. and 1326 hrs. are normal.  Relevant CV Studies: Echocardiogram 2015 - Left ventricle: The cavity size was normal. Systolic function was  normal. The estimated ejection fraction was in the range of 55%  to 60%. Wall motion was normal; there were no regional wall  motion abnormalities. There was an increased relative  contribution of atrial contraction to ventricular filling.  Doppler parameters are consistent with abnormal left ventricular  relaxation (grade 1 diastolic dysfunction).  - Aorta: Ascending aortic diameter: 37 mm (S).  - Ascending aorta: The ascending aorta was mildly dilated.  - Atrial septum: There was increased thickness of the septum,  consistent with lipomatous hypertrophy. No defect or patent  foramen ovale was identified.  - Tricuspid valve: There was trivial regurgitation.   - Stress ECG conclusions: There were no stress arrhythmias or  conduction abnormalities. The stress ECG was normal. [facility]  scoring: exercise time of 6.5 min; maximum ST deviation of 0 mm;  angina present but did not limit exercise; resulting score is 3.    This score predicts a moderate risk of cardiac events.  - Staged echo: Normal echo stress   Impressions:   - Normal study after maximal exercise.      Laboratory Data:  High Sensitivity Troponin:   Recent Labs  Lab 01/09/20 1519 01/09/20 1723 01/09/20 1933 01/10/20 0904  TROPONINIHS 44* 103* 252* 708*      Chemistry Recent Labs  Lab 01/09/20 1519  NA 134*  K 3.5  CL 101  CO2 23  GLUCOSE 121*  BUN 15  CREATININE 0.58  CALCIUM 8.3*  GFRNONAA >60  GFRAA >60  ANIONGAP 10    Recent Labs  Lab 01/09/20 1519  PROT 7.7  ALBUMIN 4.0  AST 35  ALT 33  ALKPHOS 68  BILITOT 0.5   Hematology Recent Labs  Lab 01/09/20 1408  WBC 13.0*  RBC 4.10  HGB 12.5  HCT 37.2  MCV 90.7  MCH 30.5  MCHC 33.6  RDW 13.0  PLT 226   BNPNo results for input(s): BNP, PROBNP in the last 168 hours.  DDimer No results for input(s): DDIMER in the last 168 hours.   Radiology/Studies:  DG Chest 2 View  Result Date: 01/09/2020 CLINICAL DATA:  Left side pain. CT dizzy, lightheaded and felt like she was going to pass out. Nauseated. EXAM: CHEST - 2 VIEW COMPARISON:  12/08/2017 FINDINGS: There is faint focal opacity involving the lingula, best seen on the LATERAL view. No pulmonary edema. No pleural effusions. Heart size is normal. IMPRESSION: Atelectasis or infiltrate in the lingula. Electronically Signed   By: Norva Pavlov M.D.   On: 01/09/2020 14:56   CT Angio Chest PE W and/or Wo Contrast  Result Date: 01/09/2020 CLINICAL DATA:  Nausea beginning today while working outside with dizziness and left flank pain. Possible pulmonary embolism. EXAM: CT ANGIOGRAPHY CHEST WITH CONTRAST TECHNIQUE: Multidetector CT imaging of the chest was performed using the standard protocol during bolus administration of intravenous contrast. Multiplanar CT image reconstructions and MIPs were obtained to evaluate the vascular anatomy. CONTRAST:  OMNIPAQUE IOHEXOL 350 MG/ML SOLN COMPARISON:   12/08/2017 FINDINGS: Cardiovascular: Borderline cardiomegaly. Thoracic aorta is normal in caliber without aneurysm or dissection. Pulmonary arterial system is adequately opacified without evidence of pulmonary emboli. Remaining vascular structures are unremarkable. Mediastinum/Nodes: No evidence of mediastinal or hilar adenopathy. Remaining mediastinal structures  are normal. Lungs/Pleura: Lungs are adequately inflated without focal airspace consolidation or effusion. Airways are normal. Upper Abdomen: Previous cholecystectomy.  No acute findings. Musculoskeletal: No acute findings. Review of the MIP images confirms the above findings. IMPRESSION: No acute cardiopulmonary disease and no evidence of pulmonary embolism. Electronically Signed   By: Elberta Fortis M.D.   On: 01/09/2020 16:59   CT Renal Stone Study  Result Date: 01/09/2020 CLINICAL DATA:  Left-sided flank pain EXAM: CT ABDOMEN AND PELVIS WITHOUT CONTRAST TECHNIQUE: Multidetector CT imaging of the abdomen and pelvis was performed following the standard protocol without IV contrast. COMPARISON:  12/08/2017 FINDINGS: Lower chest: Lung bases are clear bilaterally. Hepatobiliary: Fatty infiltration of the liver is noted. The gallbladder has been surgically removed. Pancreas: Unremarkable. No pancreatic ductal dilatation or surrounding inflammatory changes. Spleen: Scattered calcified granulomas are again noted and stable. Adrenals/Urinary Tract: Adrenal glands are within normal limits. Kidneys are well visualized bilaterally. No renal calculi or obstructive changes are seen. The bladder is partially decompressed. Stomach/Bowel: Fecal material is noted throughout the colon without obstructive change. No inflammatory changes are noted. The appendix is within normal limits. No obstructive changes are seen in the small bowel. The stomach is within normal limits. Vascular/Lymphatic: No significant vascular findings are present. No enlarged abdominal or pelvic  lymph nodes. Reproductive: Uterus is well visualized with prominent endometrial canal likely related to the patient's current menstrual status. Correlate with clinical history. Left adnexal cyst is again noted stable from prior CT examination. Other: No abdominal wall hernia or abnormality. No abdominopelvic ascites. Musculoskeletal: Mild degenerative changes of lumbar spine are noted. No compression deformities are seen. IMPRESSION: Fatty liver. No renal calculi or obstructive changes are noted. Prominent endometrial canal likely related to the current menstrual status. Correlate with the patient's clinical menstrual history. Electronically Signed   By: Alcide Clever M.D.   On: 01/09/2020 15:56       TIMI Risk Score for Unstable Angina or Non-ST Elevation MI:   The patient's TIMI risk score is  , which indicates a  8 % risk of all cause mortality, new or recurrent myocardial infarction or need for urgent revascularization in the next 14 days.   Assessment and Plan:   1. NSTEMI: Symptoms are nonspecific, but could represent unstable angina pectoris.  Recommend coronary angiography and percutaneous intervention if indicated. This procedure has been fully reviewed with the patient and written informed consent has been obtained.   2. Dyslipidemia: Remarkably low HDL cholesterol level for a premenopausal woman.  Important to recheck her lipid profile. 3. HTN: On amlodipine.  Well-controlled. 4. Super obesity: Note that she had a 40 pound weight gain in the last couple of years.  Severity of Illness: The appropriate patient status for this patient is OBSERVATION. Observation status is judged to be reasonable and necessary in order to provide the required intensity of service to ensure the patient's safety. The patient's presenting symptoms, physical exam findings, and initial radiographic and laboratory data in the context of their medical condition is felt to place them at decreased risk for further  clinical deterioration. Furthermore, it is anticipated that the patient will be medically stable for discharge from the hospital within 2 midnights of admission. The following factors support the patient status of observation.   " The patient's presenting symptoms include chest pain at rest. " The physical exam findings include super obesity. " The initial radiographic and laboratory data are abnormal cardiac enzymes.     For questions or updates,  please contact [place] Please consult [email or web address] for contact info under        Signed, Sanda Klein, MD  01/10/2020 2:42 PM

## 2020-01-10 NOTE — Progress Notes (Signed)
ANTICOAGULATION CONSULT NOTE   Pharmacy Consult for heparin Indication: chest pain/ACS  Allergies  Allergen Reactions  . Zofran Other (See Comments)    headache  . Reglan [Metoclopramide] Palpitations    Patient Measurements: Height: 5\' 4"  (162.6 cm) Weight: (!) 140.6 kg (310 lb) IBW/kg (Calculated) : 54.7 Heparin Dosing Weight: 89 kg   Vital Signs: Temp: 97.8 F (36.6 C) (04/30 0730) BP: 157/93 (04/30 1030) Pulse Rate: 68 (04/30 1011)  Labs: Recent Labs    01/09/20 1408 01/09/20 1519 01/09/20 1519 01/09/20 1723 01/09/20 1933 01/10/20 0800 01/10/20 0904  HGB 12.5  --   --   --   --   --   --   HCT 37.2  --   --   --   --   --   --   PLT 226  --   --   --   --   --   --   HEPARINUNFRC  --   --   --   --   --  0.95*  --   CREATININE  --  0.58  --   --   --   --   --   TROPONINIHS  --  44*   < > 103* 252*  --  708*   < > = values in this interval not displayed.    Estimated Creatinine Clearance: 119.7 mL/min (by C-G formula based on SCr of 0.58 mg/dL).   Medical History: Past Medical History:  Diagnosis Date  . Hypertension   . Ovarian cyst   . Pancreatic cyst   . Pancreatitis     Medications:  (Not in a hospital admission)   Assessment: Teresa Valenzuela with presents with chest pain concerning for ACS in the setting of elevated troponins. Pharmacy consulted to start IV heparin.   Initial heparin level supratherapeutic on 1150 units/hr, no bleeding reported  Goal of Therapy:  Heparin level 0.3-0.7 units/ml Monitor platelets by anticoagulation protocol: Yes   Plan:  Decrease heparin gtt to 950 units/hr F/u 6 hour heparin level  54, PharmD Clinical Pharmacist ED Pharmacist Phone # [phone] 01/10/2020 10:40 AM

## 2020-01-10 NOTE — Progress Notes (Signed)
Patient experiencing frequent urination with second void apart with blood in BSC. Paged MD to make aware. No menstruation per patient for 6 months

## 2020-01-10 NOTE — ED Provider Notes (Addendum)
Patient admitted has been awaiting bed assignment.   Patient presented yesterday after lightheadedness and dizziness at work.  She does describe some sensation of pressure across the top of her chest and into her arms that she attributed to going back to work and lifting a heavy drill.  She also thought she might of had a flare of pancreatitis because she has had similar pain with that.  She has chronically had problems with left flank pain that radiates around to her anterior flank and upper abdomen.  No specific etiology is known for this. Patient reports she is still having some nausea this morning.  She reports she still has the flank pain if she moves a certain way or takes a deep breath.  She reports that the pain and heaviness into her chest and arms resolved.  She reports she still notes a very vague sense of pressure on the left upper chest but does not find it painful. Physical Exam  BP 130/78   Pulse (!) 59   Temp 97.8 F (36.6 C)   Resp 10   Ht 5\' 4"  (1.626 m)   Wt (!) 140.6 kg   SpO2 97%   BMI 53.21 kg/m   Physical Exam Constitutional:      Comments: Alert, nontoxic clinically well in appearance.  No respiratory distress.  Eyes:     Extraocular Movements: Extraocular movements intact.  Cardiovascular:     Rate and Rhythm: Normal rate and regular rhythm.  Pulmonary:     Effort: Pulmonary effort is normal.     Breath sounds: Normal breath sounds.     Comments: Patient is exquisitely tender to palpation in the dermatome of about T10-T11.  This carries around from her back to her lower chest.  There is no palpable abnormality or skin change.  She identifies this is a chronically very sensitive area. Abdominal:     Comments: Aside from left upper quadrant which is contiguous with thoracic tenderness, abdomen is soft and nontender.  Musculoskeletal:        General: Normal range of motion.     Right lower leg: No edema.     Left lower leg: No edema.  Skin:    General: Skin is  warm and dry.  Neurological:     General: No focal deficit present.     Mental Status: She is oriented to person, place, and time.     Coordination: Coordination normal.  Psychiatric:        Mood and Affect: Mood normal.     ED Course/Procedures   Clinical Course as of Jan 10 919  Thu Jan 09, 2020  1822 Dr. 1823   [BM]  1933 Dr.Gardner; sl nitro, if third trop elevated then heparin   [BM]    Clinical Course User Index [BM] Antoine Poche, PA-C    Procedures  MDM  Patient has been in the emergency department for 20 hours awaiting bed placement.  Will add another serial troponin.  Last troponin was drawn yesterday evening at 19: 33.  Troponins were elevating.  Patient's vital signs are normal.  Heart rate is regular.  Patient is normotensive.  No signs of distress clinically.  Will repeat EKG as well.  Phenergan ordered for report of nausea.  Patient is currently on a heparin drip.  We will continue to monitor and recheck as needed.  Repeat EKG (09:05 01/10/2020)  shows no interval change.  Rate is controlled.  No ischemic changes.   10: 40am reviewed  with Dr. Sallyanne Kuster.  As the patient's troponins have elevated he will add to the list for Cath Lab.  We will continue heparin.   Charlesetta Shanks, MD 01/10/20 [id number]    Charlesetta Shanks, MD 01/10/20 [place], [place], MD 01/10/20 1054

## 2020-01-10 NOTE — Interval H&P Note (Signed)
History and Physical Interval Note:  01/10/2020 3:14 PM  Teresa Valenzuela  has presented today for surgery, with the diagnosis of Nonstemi.  The various methods of treatment have been discussed with the patient and family. After consideration of risks, benefits and other options for treatment, the patient has consented to  Procedure(s): LEFT HEART CATH AND CORONARY ANGIOGRAPHY (N/A)  PERCUTANEOUS CORONARY INTERVENTION  as a surgical intervention.  The patient's history has been reviewed, patient examined, no change in status, stable for surgery.  I have reviewed the patient's chart and labs.  Questions were answered to the patient's satisfaction.    Cath Lab Visit (complete for each Cath Lab visit)  Clinical Evaluation Leading to the Procedure:   ACS: Yes.    Non-ACS:    Anginal Classification: CCS IV  Anti-ischemic medical therapy: Minimal Therapy (1 class of medications)  Non-Invasive Test Results: No non-invasive testing performed  Prior CABG: No previous CABG  Bryan Lemma

## 2020-01-10 NOTE — ED Notes (Signed)
ED Provider at bedside. Dr. Pfeiffer 

## 2020-01-11 ENCOUNTER — Observation Stay (HOSPITAL_COMMUNITY): Payer: BC Managed Care – PPO

## 2020-01-11 DIAGNOSIS — I5031 Acute diastolic (congestive) heart failure: Secondary | ICD-10-CM

## 2020-01-11 DIAGNOSIS — E785 Hyperlipidemia, unspecified: Secondary | ICD-10-CM | POA: Diagnosis present

## 2020-01-11 DIAGNOSIS — Z7982 Long term (current) use of aspirin: Secondary | ICD-10-CM | POA: Diagnosis not present

## 2020-01-11 DIAGNOSIS — I200 Unstable angina: Secondary | ICD-10-CM | POA: Diagnosis not present

## 2020-01-11 DIAGNOSIS — Z79899 Other long term (current) drug therapy: Secondary | ICD-10-CM | POA: Diagnosis not present

## 2020-01-11 DIAGNOSIS — I4510 Unspecified right bundle-branch block: Secondary | ICD-10-CM | POA: Diagnosis present

## 2020-01-11 DIAGNOSIS — R778 Other specified abnormalities of plasma proteins: Secondary | ICD-10-CM | POA: Diagnosis not present

## 2020-01-11 DIAGNOSIS — Z6841 Body Mass Index (BMI) 40.0 and over, adult: Secondary | ICD-10-CM | POA: Diagnosis not present

## 2020-01-11 DIAGNOSIS — I110 Hypertensive heart disease with heart failure: Secondary | ICD-10-CM | POA: Diagnosis present

## 2020-01-11 DIAGNOSIS — Z888 Allergy status to other drugs, medicaments and biological substances status: Secondary | ICD-10-CM | POA: Diagnosis not present

## 2020-01-11 DIAGNOSIS — R319 Hematuria, unspecified: Secondary | ICD-10-CM | POA: Diagnosis present

## 2020-01-11 DIAGNOSIS — I248 Other forms of acute ischemic heart disease: Secondary | ICD-10-CM | POA: Diagnosis present

## 2020-01-11 DIAGNOSIS — Z20822 Contact with and (suspected) exposure to covid-19: Secondary | ICD-10-CM | POA: Diagnosis present

## 2020-01-11 LAB — ECHOCARDIOGRAM COMPLETE
Height: 64 in
Weight: 4764.8 oz

## 2020-01-11 MED ORDER — SPIRONOLACTONE 12.5 MG HALF TABLET
12.5000 mg | ORAL_TABLET | Freq: Every day | ORAL | Status: DC
  Administered 2020-01-11 – 2020-01-12 (×2): 12.5 mg via ORAL
  Filled 2020-01-11 (×2): qty 1

## 2020-01-11 MED ORDER — FUROSEMIDE 10 MG/ML IJ SOLN
40.0000 mg | Freq: Two times a day (BID) | INTRAMUSCULAR | Status: DC
  Administered 2020-01-11 – 2020-01-12 (×2): 40 mg via INTRAVENOUS
  Filled 2020-01-11 (×2): qty 4

## 2020-01-11 MED ORDER — DOCUSATE SODIUM 100 MG PO CAPS
200.0000 mg | ORAL_CAPSULE | Freq: Two times a day (BID) | ORAL | Status: DC
  Administered 2020-01-11 – 2020-01-12 (×3): 200 mg via ORAL
  Filled 2020-01-11 (×3): qty 2

## 2020-01-11 MED ORDER — PROMETHAZINE HCL 25 MG PO TABS
25.0000 mg | ORAL_TABLET | Freq: Three times a day (TID) | ORAL | Status: DC | PRN
  Administered 2020-01-11: 25 mg via ORAL
  Filled 2020-01-11: qty 1

## 2020-01-11 MED ORDER — IBUPROFEN 200 MG PO TABS
200.0000 mg | ORAL_TABLET | Freq: Once | ORAL | Status: AC | PRN
  Administered 2020-01-11: 200 mg via ORAL
  Filled 2020-01-11: qty 1

## 2020-01-11 MED ORDER — MORPHINE SULFATE (PF) 2 MG/ML IV SOLN
2.0000 mg | Freq: Once | INTRAVENOUS | Status: AC
  Administered 2020-01-11: 2 mg via INTRAVENOUS
  Filled 2020-01-11: qty 1

## 2020-01-11 NOTE — Progress Notes (Signed)
Patient concerned about if discharged today she will need a dose of methadone due to clinic being closed on sundays. FYI she usually gets dose for Sunday on saturdays.

## 2020-01-11 NOTE — Progress Notes (Signed)
Progress Note  Patient Name: Teresa Valenzuela Date of Encounter: 01/11/2020  Primary Cardiologist: New patient/ Dr Sallyanne Kuster  Subjective   She feels significantly better but not back to her baseline.  Inpatient Medications    Scheduled Meds: . amLODipine  5 mg Oral Daily  . heparin  5,000 Units Subcutaneous Q8H  . methadone  65 mg Oral Daily  . nitroGLYCERIN  1 inch Topical Q6H  . sodium chloride flush  3 mL Intravenous Q12H  . sodium chloride flush  3 mL Intravenous Q12H   Continuous Infusions: . sodium chloride     PRN Meds: sodium chloride, acetaminophen, nitroGLYCERIN, sodium chloride flush   Vital Signs    Vitals:   01/11/20 0352 01/11/20 0352 01/11/20 0752 01/11/20 1119  BP:  119/70 131/78 139/78  Pulse:  62 65 65  Resp:  17 20 20   Temp:  97.7 F (36.5 C) 98.2 F (36.8 C) 98.1 F (36.7 C)  TempSrc:  Oral Oral Oral  SpO2:  97% 97% 94%  Weight: 135.1 kg     Height:        Intake/Output Summary (Last 24 hours) at 01/11/2020 1257 Last data filed at 01/11/2020 1250 Gross per 24 hour  Intake 1335 ml  Output 2100 ml  Net -765 ml   Last 3 Weights 01/11/2020 01/09/2020 12/08/2017  Weight (lbs) 297 lb 12.8 oz 310 lb 270 lb  Weight (kg) 135.081 kg 140.615 kg 122.471 kg      Telemetry    Sinus rhythm in 60s- Personally Reviewed  ECG    No new tracing- Personally Reviewed  Physical Exam   GEN: No acute distress.  Morbidly obese Neck: No JVD Cardiac: RRR, no murmurs, rubs, or gallops.  Respiratory: Clear to auscultation bilaterally. GI: Soft, nontender, non-distended  MS: No edema; No deformity. Neuro:  Nonfocal  Psych: Normal affect   Labs    High Sensitivity Troponin:   Recent Labs  Lab 01/09/20 1519 01/09/20 1723 01/09/20 1933 01/10/20 0904  TROPONINIHS 44* 103* 252* 708*      Chemistry Recent Labs  Lab 01/09/20 1519 01/10/20 1716  NA 134*  --   K 3.5  --   CL 101  --   CO2 23  --   GLUCOSE 121*  --   BUN 15  --   CREATININE 0.58  0.79  CALCIUM 8.3*  --   PROT 7.7  --   ALBUMIN 4.0  --   AST 35  --   ALT 33  --   ALKPHOS 68  --   BILITOT 0.5  --   GFRNONAA >60 >60  GFRAA >60 >60  ANIONGAP 10  --      Hematology Recent Labs  Lab 01/09/20 1408 01/10/20 1716  WBC 13.0* 9.8  RBC 4.10 4.49  HGB 12.5 13.3  HCT 37.2 41.2  MCV 90.7 91.8  MCH 30.5 29.6  MCHC 33.6 32.3  RDW 13.0 13.1  PLT 226 253    BNPNo results for input(s): BNP, PROBNP in the last 168 hours.   DDimer No results for input(s): DDIMER in the last 168 hours.   Radiology     Cardiac Studies   Cardiac catheterization 01/10/2020   Mid LAD to Dist LAD short segment in a band that has mild myocardial bridging. Otherwise angiographically normal coronary arteries  The left ventricular systolic function is normal. The left ventricular ejection fraction is 50-55% by visual estimate.  LV end diastolic pressure is severely elevated.  Angiographically normal coronary arteries with minimal disease and moderate tortuosity.  No culprit lesion to explain elevated troponin  Severely elevated LVEDP with systolic pressures in the 150s-LVEDP was 34 mmHg.  Consistent with ACUTE DIASTOLIC HEART FAILURE ? Was given 40 mg IV Lasix in the Cath Lab.  May need to consider additional diuretic in the morning.  Echocardiogram 01/11/2020  Normal LV systolic function; grade 1 diastolic dysfunction.  2. Left ventricular ejection fraction, by estimation, is 60 to 65%. The  left ventricle has normal function. The left ventricle has no regional  wall motion abnormalities. Left ventricular diastolic parameters are  consistent with Grade I diastolic  dysfunction (impaired relaxation).  3. Right ventricular systolic function is normal. The right ventricular  size is normal.  4. The mitral valve is normal in structure. Trivial mitral valve  regurgitation. No evidence of mitral stenosis.  5. The aortic valve is normal in structure. Aortic valve  regurgitation is  not visualized. No aortic stenosis is present.  6. The inferior vena cava is normal in size with greater than 50%  respiratory variability, suggesting right atrial pressure of 3 mmHg.    Patient Profile     49 y.o. female admitted with elevated troponin  Assessment & Plan    1. NSTEMI:  Troponins 103 -> 252 -> 708, cardiac catheterization yesterday showed normal coronary arteries with intramyocardial bridge but elevated right-sided pressures consistent with acute diastolic heart failure  2. Acute diastolic CHF , grade 1 diastolic dysfunction with elevated filling pressures, LVEF 60 to 65%, I's and O's are not recorded properly, she was admitted with 310 pounds, today 297 pounds, she states that her baseline 6 months ago was 270 pounds.  She still appears mildly fluid overloaded, I will continue IV Lasix, anticipated discharge in the next day or 2.   1. Restart Lasix 40 mg IV twice daily and spironolactone 12.5 mg daily.  3. Dyslipidemia: Remarkably low HDL cholesterol level for a premenopausal woman.  Important to recheck her lipid profile. 4. HTN: On amlodipine.  Well-controlled. 5. Super obesity: Note that she had a 40 pound weight gain in the last couple of years.  For questions or updates, please contact [facility] Please consult [email or web address] for contact info under     Signed, Tobias Alexander, MD  01/11/2020, 12:57 PM

## 2020-01-11 NOTE — Progress Notes (Addendum)
Patient experiencing Pink tinged Amber Urine that has being intermittently going on since 4/30 in the evening.

## 2020-01-11 NOTE — Plan of Care (Signed)

## 2020-01-11 NOTE — Progress Notes (Signed)
  Echocardiogram 2D Echocardiogram has been performed.  Teresa Valenzuela [name] 01/11/2020, 9:34 AM

## 2020-01-12 ENCOUNTER — Encounter (HOSPITAL_COMMUNITY): Payer: Self-pay | Admitting: Internal Medicine

## 2020-01-12 ENCOUNTER — Other Ambulatory Visit: Payer: Self-pay | Admitting: Physician Assistant

## 2020-01-12 DIAGNOSIS — I5031 Acute diastolic (congestive) heart failure: Secondary | ICD-10-CM

## 2020-01-12 DIAGNOSIS — E785 Hyperlipidemia, unspecified: Secondary | ICD-10-CM

## 2020-01-12 DIAGNOSIS — R319 Hematuria, unspecified: Secondary | ICD-10-CM

## 2020-01-12 HISTORY — DX: Hyperlipidemia, unspecified: E78.5

## 2020-01-12 MED ORDER — NITROGLYCERIN 0.4 MG SL SUBL: 0.4000 mg | SUBLINGUAL_TABLET | SUBLINGUAL | 12 refills | Status: DC | PRN

## 2020-01-12 MED ORDER — ACETAMINOPHEN 325 MG PO TABS: 650.0000 mg | ORAL_TABLET | ORAL | Status: AC | PRN

## 2020-01-12 MED ORDER — SPIRONOLACTONE 25 MG PO TABS: 25.0000 mg | ORAL_TABLET | Freq: Every day | ORAL | 11 refills | Status: AC

## 2020-01-12 MED ORDER — FUROSEMIDE 40 MG PO TABS: 40.0000 mg | ORAL_TABLET | Freq: Two times a day (BID) | ORAL | 3 refills | Status: DC

## 2020-01-12 MED ORDER — SPIRONOLACTONE 25 MG PO TABS: 25.0000 mg | ORAL_TABLET | Freq: Every day | ORAL | Status: DC

## 2020-01-12 MED ORDER — FUROSEMIDE 40 MG PO TABS
40.0000 mg | ORAL_TABLET | Freq: Two times a day (BID) | ORAL | Status: DC
  Filled 2020-01-12: qty 1

## 2020-01-12 NOTE — Progress Notes (Signed)
Received referral to assist pt with Methadone if D/C yesterday. Met with pt. She reports that she has had her Methadone dose for today and she is good for today. Pt has been D/C today. She denies any D/C needs.

## 2020-01-12 NOTE — Discharge Summary (Signed)
Discharge Summary    Patient ID: CONCEPTION DOEBLER MRN: [id number]; DOB: 1970-10-07  Admit date: 01/09/2020 Discharge date: 01/12/2020  Primary Care Provider: Patient, No Pcp Per  Primary Cardiologist: Sanda Klein, MD  Primary Electrophysiologist:  None    Discharge Diagnoses    Principal Problem:   Acute diastolic CHF (congestive heart failure) ([place]) Active Problems:   Demand ischemia ([place])   Essential hypertension, benign   Dyslipidemia   Hematuria   Diagnostic Studies/Procedures    Echocardiogram 01/11/2020 EF 60-65, no RWMA, GR 1 DD, normal RVSF, trivial MR  Cardiac catheterization 01/10/2020 LAD mid intramyocardial bridge  EF 50-55 LVEDP 34  Angiographically normal coronary arteries with minimal disease and moderate tortuosity.  No culprit lesion to explain elevated troponin  Severely elevated LVEDP with systolic pressures in the 150s-LVEDP was 34 mmHg.  Consistent with ACUTE DIASTOLIC HEART FAILURE _____________   History of Present Illness     Teresa Valenzuela is a 49 y.o. female with a history of systemic hypertension, morbid obesity, remote history of gallstone pancreatitis/pancreatic cyst and cholecystectomy, who presented with unrelenting chest discomfort for 24 hours.  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  On the day prior to admission, she developed discomfort in her upper chest with worsening symptoms involving radiation down both arms, dizziness/lightheadedness.  She presented to [facility] and serial cardiac markers were noted to be steadily increasing (44 > >700).  CT was negative for pulmonary embolism.  She was admitted for further evaluation management.  Hospital Course     Consultants:  None    The patient was admitted with a working diagnosis of non-STEMI given her elevated high-sensitivity troponins.  Cardiac catheterization was recommended.  This demonstrated normal coronary arteries with intramyocardial bridge.  She did  have elevated right-sided pressures consistent with acute diastolic heart failure.  Therefore, this appeared to be demand ischemia related to her volume overload.  Echocardiogram demonstrated EF 60-65 with grade 1 diastolic dysfunction.  She was managed with IV furosemide.  This morning, she was evaluated by Dr. Meda Coffee.  The patient was overall improved and appeared to be euvolemic on exam.  She was felt to be stable for discharge to home.  Of note, the patient's admission weight was 310 pounds.  Discharge weight 295 pounds.  Her baseline weight about 6 months ago was 270 pounds.  Plan is to discharge the patient on furosemide 40 mg twice daily along with spironolactone 25 mg daily.  She will need early follow-up in the clinic with a BMET next week.  Of note, her urinalysis demonstrated mod Hgb.  WBCs and bacteria were not seen and nitrites and leukocytes were neg.  She was asked to follow up with primary care after DC.   Did the patient have an acute coronary syndrome (MI, NSTEMI, STEMI, etc) this admission?:  No.   The elevated Troponin was due to the acute medical illness (demand ischemia).  _____________  Discharge Vitals Blood pressure 128/64, pulse 71, temperature 98.3 F (36.8 C), temperature source Oral, resp. rate 20, height 5\' 4"  (1.626 m), weight 134.1 kg, SpO2 95 %.  Filed Weights   01/09/20 1359 01/11/20 0352 01/12/20 0453  Weight: (!) 140.6 kg 135.1 kg 134.1 kg    Labs & Radiologic Studies    CBC Recent Labs    01/09/20 1408 01/10/20 1716  WBC 13.0* 9.8  NEUTROABS 10.6*  --   HGB 12.5 13.3  HCT 37.2 41.2  MCV 90.7 91.8  PLT 226  253   Basic Metabolic Panel Recent Labs    93/79/02 1519 01/10/20 1716  NA 134*  --   K 3.5  --   CL 101  --   CO2 23  --   GLUCOSE 121*  --   BUN 15  --   CREATININE 0.58 0.79  CALCIUM 8.3*  --    Liver Function Tests Recent Labs    01/09/20 1519  AST 35  ALT 33  ALKPHOS 68  BILITOT 0.5  PROT 7.7  ALBUMIN 4.0   Recent Labs      01/09/20 1519  LIPASE 44   High Sensitivity Troponin:   Recent Labs  Lab 01/09/20 1519 01/09/20 1723 01/09/20 1933 01/10/20 0904  TROPONINIHS 44* 103* 252* 708*    Urinalysis    Component Value Date/Time   COLORURINE COLORLESS (A) 01/10/2020 1900   APPEARANCEUR CLEAR 01/10/2020 1900   LABSPEC 1.013 01/10/2020 1900   PHURINE 5.0 01/10/2020 1900   GLUCOSEU NEGATIVE 01/10/2020 1900   HGBUR MODERATE (A) 01/10/2020 1900   BILIRUBINUR NEGATIVE 01/10/2020 1900   KETONESUR NEGATIVE 01/10/2020 1900   PROTEINUR NEGATIVE 01/10/2020 1900   UROBILINOGEN 1.0 06/11/2015 1455   NITRITE NEGATIVE 01/10/2020 1900   LEUKOCYTESUR NEGATIVE 01/10/2020 1900   _____________  DG Chest 2 View Result Date: 01/09/2020 IMPRESSION: Atelectasis or infiltrate in the lingula. Electronically Signed   By: Norva Pavlov M.D.   On: 01/09/2020 14:56   CT Angio Chest PE W and/or Wo Contrast Result Date: 01/09/2020 IMPRESSION: No acute cardiopulmonary disease and no evidence of pulmonary embolism. Electronically Signed   By: Elberta Fortis M.D.   On: 01/09/2020 16:59   CT Renal Stone Study Result Date: 01/09/2020 IMPRESSION: Fatty liver. No renal calculi or obstructive changes are noted. Prominent endometrial canal likely related to the current menstrual status. Correlate with the patient's clinical menstrual history. Electronically Signed   By: Alcide Clever M.D.   On: 01/09/2020 15:56   Disposition   Pt is being discharged home today in good condition.  Follow-up Plans & Appointments    Follow-up Information    Croitoru, Mihai, MD Follow up in 1 week(s).   Specialty: Cardiology Why: The office will call to arrange an appointment with Dr. Royann Shivers or one of his PAs or NPs Contact information: [address] [place] [address] [phone]        [facility] Follow up in 1 week(s).   Specialty: Cardiology Why: You will need a lab test (BMET) in the next 5 days to  recheck your kidney function and potassium.  It is important that you have this test done.  Contact information: [address] [place] [address] [phone]         Discharge Instructions    (HEART FAILURE PATIENTS) Call MD:  Anytime you have any of the following symptoms: 1) 3 pound weight gain in 24 hours or 5 pounds in 1 week 2) shortness of breath, with or without a dry hacking cough 3) swelling in the hands, feet or stomach 4) if you have to sleep on extra pillows at night in order to breathe.   Complete by: As directed    Diet - low sodium heart healthy   Complete by: As directed    Discharge wound care:   Complete by: As directed    Call for any swelling bleeding bruising or fever.   Increase activity slowly   Complete by: As directed  Lifting restrictions   Complete by: As directed    No lifting over 10 lbs for 1 week      Discharge Medications   Allergies as of 01/12/2020      Reactions   Zofran Other (See Comments)   Caused headaches   Metoclopramide Palpitations, Anxiety   Ondansetron Other (See Comments)   Caused headaches      Medication List    STOP taking these medications   GOODY HEADACHE PO   SINUS & ALLERGY PO     TAKE these medications   acetaminophen 325 MG tablet Commonly known as: TYLENOL Take 2 tablets (650 mg total) by mouth every 4 (four) hours as needed for headache or mild pain.   amLODipine 5 MG tablet Commonly known as: NORVASC Take 5 mg by mouth daily.   aspirin 81 MG tablet Take 81 mg by mouth daily.   furosemide 40 MG tablet Commonly known as: LASIX Take 1 tablet (40 mg total) by mouth 2 (two) times daily.   methadone 10 MG/ML solution Commonly known as: DOLOPHINE Take 65 mg by mouth daily.   nitroGLYCERIN 0.4 MG SL tablet Commonly known as: NITROSTAT Place 1 tablet (0.4 mg total) under the tongue every 5 (five) minutes as needed for chest pain.   promethazine 25 MG tablet Commonly  known as: PHENERGAN Take 25 mg by mouth every 6 (six) hours as needed for nausea or vomiting.   spironolactone 25 MG tablet Commonly known as: ALDACTONE Take 1 tablet (25 mg total) by mouth daily. Start taking on: Jan 13, 2020            Discharge Care Instructions  (From admission, onward)         Start     Ordered   01/12/20 0000  Discharge wound care:    Comments: Call for any swelling bleeding bruising or fever.   01/12/20 1140             Outstanding Labs/Studies   BMET in next 5 days   Duration of Discharge Encounter   Greater than 30 minutes including physician time.  Signed, [name], PA-C 01/12/2020, 11:44 AM

## 2020-01-12 NOTE — Discharge Instructions (Signed)
Weigh yourself every day and record.  Call our office [phone]) if you notice a weight gain of 3 lbs or more in 1 day.  Record your blood pressure every day.  Bring a list of your weights and blood pressure readings to your follow up appointment.    Maintain a low salt diet.   Please arrange follow up with primary care to recheck your urine.  There was blood noted on urinalysis but no sign of infection.   Low-Sodium Eating Plan Sodium, which is an element that makes up salt, helps you maintain a healthy balance of fluids in your body. Too much sodium can increase your blood pressure and cause fluid and waste to be held in your body. Your health care provider or dietitian may recommend following this plan if you have high blood pressure (hypertension), kidney disease, liver disease, or heart failure. Eating less sodium can help lower your blood pressure, reduce swelling, and protect your heart, liver, and kidneys. What are tips for following this plan? General guidelines  Most people on this plan should limit their sodium intake to 1,500-2,000 mg (milligrams) of sodium each day. Reading food labels   The Nutrition Facts label lists the amount of sodium in one serving of the food. If you eat more than one serving, you must multiply the listed amount of sodium by the number of servings.  Choose foods with less than 140 mg of sodium per serving.  Avoid foods with 300 mg of sodium or more per serving. Shopping  Look for lower-sodium products, often labeled as "low-sodium" or "no salt added."  Always check the sodium content even if foods are labeled as "unsalted" or "no salt added".  Buy fresh foods. ? Avoid canned foods and premade or frozen meals. ? Avoid canned, cured, or processed meats  Buy breads that have less than 80 mg of sodium per slice. Cooking  Eat more home-cooked food and less restaurant, buffet, and fast food.  Avoid adding salt when cooking. Use salt-free  seasonings or herbs instead of table salt or sea salt. Check with your health care provider or pharmacist before using salt substitutes.  Cook with plant-based oils, such as canola, sunflower, or olive oil. Meal planning  When eating at a restaurant, ask that your food be prepared with less salt or no salt, if possible.  Avoid foods that contain MSG (monosodium glutamate). MSG is sometimes added to [place] food, bouillon, and some canned foods. What foods are recommended? The items listed may not be a complete list. Talk with your dietitian about what dietary choices are best for you. Grains Low-sodium cereals, including oats, puffed wheat and rice, and shredded wheat. Low-sodium crackers. Unsalted rice. Unsalted pasta. Low-sodium bread. Whole-grain breads and whole-grain pasta. Vegetables Fresh or frozen vegetables. "No salt added" canned vegetables. "No salt added" tomato sauce and paste. Low-sodium or reduced-sodium tomato and vegetable juice. Fruits Fresh, frozen, or canned fruit. Fruit juice. Meats and other protein foods Fresh or frozen (no salt added) meat, poultry, seafood, and fish. Low-sodium canned tuna and salmon. Unsalted nuts. Dried peas, beans, and lentils without added salt. Unsalted canned beans. Eggs. Unsalted nut butters. Dairy Milk. Soy milk. Cheese that is naturally low in sodium, such as ricotta cheese, fresh mozzarella, or Swiss cheese Low-sodium or reduced-sodium cheese. Cream cheese. Yogurt. Fats and oils Unsalted butter. Unsalted margarine with no trans fat. Vegetable oils such as canola or olive oils. Seasonings and other foods Fresh and dried herbs and spices. Salt-free  seasonings. Low-sodium mustard and ketchup. Sodium-free salad dressing. Sodium-free light mayonnaise. Fresh or refrigerated horseradish. Lemon juice. Vinegar. Homemade, reduced-sodium, or low-sodium soups. Unsalted popcorn and pretzels. Low-salt or salt-free chips. What foods are not  recommended? The items listed may not be a complete list. Talk with your dietitian about what dietary choices are best for you. Grains Instant hot cereals. Bread stuffing, pancake, and biscuit mixes. Croutons. Seasoned rice or pasta mixes. Noodle soup cups. Boxed or frozen macaroni and cheese. Regular salted crackers. Self-rising flour. Vegetables Sauerkraut, pickled vegetables, and relishes. Olives. [place] fries. Onion rings. Regular canned vegetables (not low-sodium or reduced-sodium). Regular canned tomato sauce and paste (not low-sodium or reduced-sodium). Regular tomato and vegetable juice (not low-sodium or reduced-sodium). Frozen vegetables in sauces. Meats and other protein foods Meat or fish that is salted, canned, smoked, spiced, or pickled. Bacon, ham, sausage, hotdogs, corned beef, chipped beef, packaged lunch meats, salt pork, jerky, pickled herring, anchovies, regular canned tuna, sardines, salted nuts. Dairy Processed cheese and cheese spreads. Cheese curds. Blue cheese. Feta cheese. String cheese. Regular cottage cheese. Buttermilk. Canned milk. Fats and oils Salted butter. Regular margarine. Ghee. Bacon fat. Seasonings and other foods Onion salt, garlic salt, seasoned salt, table salt, and sea salt. Canned and packaged gravies. Worcestershire sauce. Tartar sauce. Barbecue sauce. Teriyaki sauce. Soy sauce, including reduced-sodium. Steak sauce. Fish sauce. Oyster sauce. Cocktail sauce. Horseradish that you find on the shelf. Regular ketchup and mustard. Meat flavorings and tenderizers. Bouillon cubes. Hot sauce and Tabasco sauce. Premade or packaged marinades. Premade or packaged taco seasonings. Relishes. Regular salad dressings. Salsa. Potato and tortilla chips. Corn chips and puffs. Salted popcorn and pretzels. Canned or dried soups. Pizza. Frozen entrees and pot pies. Summary  Eating less sodium can help lower your blood pressure, reduce swelling, and protect your heart, liver,  and kidneys.  Most people on this plan should limit their sodium intake to 1,500-2,000 mg (milligrams) of sodium each day.  Canned, boxed, and frozen foods are high in sodium. Restaurant foods, fast foods, and pizza are also very high in sodium. You also get sodium by adding salt to food.  Try to cook at home, eat more fresh fruits and vegetables, and eat less fast food, canned, processed, or prepared foods. This information is not intended to replace advice given to you by your health care provider. Make sure you discuss any questions you have with your health care provider. Document Revised: 08/11/2017 Document Reviewed: 08/22/2016 Elsevier Patient Education  2020 [employer].

## 2020-01-12 NOTE — Plan of Care (Signed)

## 2020-01-12 NOTE — Progress Notes (Signed)
Progress Note  Patient Name: Teresa Valenzuela Date of Encounter: 01/12/2020  Primary Cardiologist: New patient/ Dr Royann Shivers  Subjective   She feels significantly better and would like to go home. Denies recurrent chest pain or SOB.  Inpatient Medications    Scheduled Meds: . amLODipine  5 mg Oral Daily  . docusate sodium  200 mg Oral BID  . furosemide  40 mg Intravenous BID  . heparin  5,000 Units Subcutaneous Q8H  . methadone  65 mg Oral Daily  . nitroGLYCERIN  1 inch Topical Q6H  . sodium chloride flush  3 mL Intravenous Q12H  . sodium chloride flush  3 mL Intravenous Q12H  . spironolactone  12.5 mg Oral Daily   Continuous Infusions: . sodium chloride     PRN Meds: sodium chloride, acetaminophen, nitroGLYCERIN, promethazine, sodium chloride flush   Vital Signs    Vitals:   01/12/20 0016 01/12/20 0451 01/12/20 0453 01/12/20 0831  BP: 116/74 109/68  128/64  Pulse: 63 66  71  Resp: 20 18  20   Temp: 98 F (36.7 C) 98.3 F (36.8 C)  98.3 F (36.8 C)  TempSrc: Oral Oral  Oral  SpO2: 95% 95%  95%  Weight:   134.1 kg   Height:        Intake/Output Summary (Last 24 hours) at 01/12/2020 0905 Last data filed at 01/12/2020 0453 Gross per 24 hour  Intake 1215 ml  Output 2600 ml  Net -1385 ml   Last 3 Weights 01/12/2020 01/11/2020 01/09/2020  Weight (lbs) 295 lb 9.6 oz 297 lb 12.8 oz 310 lb  Weight (kg) 134.083 kg 135.081 kg 140.615 kg      Telemetry    Sinus rhythm in 60s- Personally Reviewed  ECG    No new tracing- Personally Reviewed  Physical Exam   GEN: No acute distress.  Morbidly obese Neck: No JVD Cardiac: RRR, no murmurs, rubs, or gallops.  Respiratory: Clear to auscultation bilaterally. GI: Soft, nontender, non-distended  MS: No edema; No deformity. Neuro:  Nonfocal  Psych: Normal affect   Labs    High Sensitivity Troponin:   Recent Labs  Lab 01/09/20 1519 01/09/20 1723 01/09/20 1933 01/10/20 0904  TROPONINIHS 44* 103* 252* 708*       Chemistry Recent Labs  Lab 01/09/20 1519 01/10/20 1716  NA 134*  --   K 3.5  --   CL 101  --   CO2 23  --   GLUCOSE 121*  --   BUN 15  --   CREATININE 0.58 0.79  CALCIUM 8.3*  --   PROT 7.7  --   ALBUMIN 4.0  --   AST 35  --   ALT 33  --   ALKPHOS 68  --   BILITOT 0.5  --   GFRNONAA >60 >60  GFRAA >60 >60  ANIONGAP 10  --     Hematology Recent Labs  Lab 01/09/20 1408 01/10/20 1716  WBC 13.0* 9.8  RBC 4.10 4.49  HGB 12.5 13.3  HCT 37.2 41.2  MCV 90.7 91.8  MCH 30.5 29.6  MCHC 33.6 32.3  RDW 13.0 13.1  PLT 226 253   BNPNo results for input(s): BNP, PROBNP in the last 168 hours.   DDimer No results for input(s): DDIMER in the last 168 hours.   Radiology     Cardiac Studies   Cardiac catheterization 01/10/2020   Mid LAD to Dist LAD short segment in a band that has mild myocardial bridging. Otherwise  angiographically normal coronary arteries  The left ventricular systolic function is normal. The left ventricular ejection fraction is 50-55% by visual estimate.  LV end diastolic pressure is severely elevated.    Angiographically normal coronary arteries with minimal disease and moderate tortuosity.  No culprit lesion to explain elevated troponin  Severely elevated LVEDP with systolic pressures in the 150s-LVEDP was 34 mmHg.  Consistent with ACUTE DIASTOLIC HEART FAILURE ? Was given 40 mg IV Lasix in the Cath Lab.  May need to consider additional diuretic in the morning.  Echocardiogram 01/11/2020  Normal LV systolic function; grade 1 diastolic dysfunction.  2. Left ventricular ejection fraction, by estimation, is 60 to 65%. The  left ventricle has normal function. The left ventricle has no regional  wall motion abnormalities. Left ventricular diastolic parameters are  consistent with Grade I diastolic  dysfunction (impaired relaxation).  3. Right ventricular systolic function is normal. The right ventricular  size is normal.  4. The mitral valve  is normal in structure. Trivial mitral valve  regurgitation. No evidence of mitral stenosis.  5. The aortic valve is normal in structure. Aortic valve regurgitation is  not visualized. No aortic stenosis is present.  6. The inferior vena cava is normal in size with greater than 50%  respiratory variability, suggesting right atrial pressure of 3 mmHg.    Patient Profile     49 y.o. female admitted with elevated troponin  Assessment & Plan    1. NSTEMI:  Troponins 103 -> 252 -> 708, cardiac catheterization yesterday showed normal coronary arteries with intramyocardial bridge but elevated right-sided pressures consistent with acute diastolic heart failure  2. Acute diastolic CHF , grade 1 diastolic dysfunction with elevated filling pressures, LVEF 60 to 65%, she was admitted with 310 pounds, today 295 pounds, she states that her baseline 6 months ago was 270 pounds.  She appears euvolemic, we will discharge, she needs CHF education prior to discharge, also she admits to not being compliant with her meds at home, but she is motivated to do it now. 3. We will discharge on lasix 40 mg PO BID and spironolactone 25 mg po daily.  We will arrange for an early follow up in our clinic. 4. Dyslipidemia: Remarkably low HDL cholesterol level for a premenopausal woman.  Important to recheck her lipid profile. 5. HTN: On amlodipine.  Well-controlled. 6. Obesity: Note that she had a 40 pound weight gain in the last couple of years.  For questions or updates, please contact [facility] Please consult [email or web address] for contact info under     Signed, Ena Dawley, MD  01/12/2020, 9:05 AM

## 2020-01-13 NOTE — Progress Notes (Signed)
Cardiology Office Note:    Date:  01/15/2020   ID:  Teresa Valenzuela, DOB Oct 15, 1970, MRN [id number]  PCP:  Patient, No Pcp Per  Cardiologist:  Thurmon Fair, MD   Referring MD: No ref. provider found   Chief Complaint  Patient presents with  . Hospitalization Follow-up    TOC, heart cath    History of Present Illness:    OLA RAAP is a 49 y.o. female with a hx of chronic diastolic heart failure, HTN, HLD, morbid obesity, remote hx of gallstone pancreatitis and cholecystectomy.  She has a history of a normal stress echo in 2015, performed for ER visit to evaluate chest pain.  Since then she has not routinely followed up with cardiology.  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  High-sensitivity troponins elevated from 44 --> 103 -- > 252 --> 708.  CTA negative for PE.  She was transferred to [facility] for angiography.  Left heart catheterization revealed essentially normal coronaries with myocardial bridge appreciated at the mid to distal LAD.  However, LVEDP was severely elevated suggesting acute diastolic heart failure.  Echocardiogram the next day revealed normal EF with grade 1 diastolic dysfunction and no significant valvular disease.  She was diuresed throughout her hospitalization and discharged on 40 mg Lasix twice daily and 25 mg spironolactone with close cardiology follow-up.  Of note her discharge weight was 295 pounds from 310 pounds on admission.  6 months ago her weight was 270 pounds.  She takes 81 mg aspirin.  She presents today for TOC follow-up. No chest pain. She is weighing daily and is 294 lb today. She does report continued dizziness upon standing. Will check orthostatic vitals today. She also reports numbness and tingling in her left great toe. Fasting glucose in the hospital were mildly elevated. Will check A1c today.    Past Medical History:    Diagnosis Date  . Chronic diastolic CHF 01/09/2020   Pt admx with CP and elevated Tn in 01/2020 >> Cath w NL coronary arteries, mid LAD intramyocardial bridge, LVEDP 34 // Echo 01/2020: EF 60-65, GR 1 DD  . Dyslipidemia 01/12/2020   Low HDL  . Hypertension   . Ovarian cyst   . Pancreatic cyst   . Pancreatitis     Past Surgical History:  Procedure Laterality Date  . CHOLECYSTECTOMY    . LEFT HEART CATH AND CORONARY ANGIOGRAPHY N/A 01/10/2020   Procedure: LEFT HEART CATH AND CORONARY ANGIOGRAPHY;  Surgeon: Marykay Lex, MD;  Location: [facility] INVASIVE CV LAB;  Service: Cardiovascular;  Laterality: N/A;  . TUBAL LIGATION      Current Medications: Current Meds  Medication Sig  . acetaminophen (TYLENOL) 325 MG tablet Take 2 tablets (650 mg total) by mouth every 4 (four) hours as needed for headache or mild pain.  Marland Kitchen amLODipine (NORVASC) 5 MG tablet Take 5 mg by mouth daily.   Marland Kitchen aspirin 81 MG tablet Take 81 mg by mouth daily.  . furosemide (LASIX) 40 MG tablet Take 1 tablet (40 mg total) by mouth 2 (two) times daily.  . methadone (DOLOPHINE) 10 MG/ML solution Take 65 mg by mouth daily.  . nitroGLYCERIN (NITROSTAT) 0.4 MG SL tablet Place 1 tablet (0.4 mg total) under the tongue every 5 (five) minutes as needed for chest pain.  . promethazine (PHENERGAN) 25 MG tablet Take 25 mg by mouth every 6 (six)  hours as needed for nausea or vomiting.   Marland Kitchen spironolactone (ALDACTONE) 25 MG tablet Take 1 tablet (25 mg total) by mouth daily.     Allergies:   Zofran, Metoclopramide, and Ondansetron   Social History   Socioeconomic History  . Marital status: Married    Spouse name: Not on file  . Number of children: Not on file  . Years of education: Not on file  . Highest education level: Not on file  Occupational History  . Not on file  Tobacco Use  . Smoking status: Never Smoker  . Smokeless tobacco: Never Used  Substance and Sexual Activity  . Alcohol use: No  . Drug use: Yes    Types:  Marijuana  . Sexual activity: Not on file  Other Topics Concern  . Not on file  Social History Narrative  . Not on file   Social Determinants of Health   Financial Resource Strain:   . Difficulty of Paying Living Expenses:   Food Insecurity:   . Worried About Programme researcher, broadcasting/film/video in the Last Year:   . Barista in the Last Year:   Transportation Needs:   . Freight forwarder (Medical):   Marland Kitchen Lack of Transportation (Non-Medical):   Physical Activity:   . Days of Exercise per Week:   . Minutes of Exercise per Session:   Stress:   . Feeling of Stress :   Social Connections:   . Frequency of Communication with Friends and Family:   . Frequency of Social Gatherings with Friends and Family:   . Attends Religious Services:   . Active Member of Clubs or Organizations:   . Attends Banker Meetings:   Marland Kitchen Marital Status:      Family History: The patient's family history is negative for CAD.  ROS:   Please see the history of present illness.     All other systems reviewed and are negative.  EKGs/Labs/Other Studies Reviewed:    The following studies were reviewed today:  Left heart cath 01/10/2020  Mid LAD to Dist LAD short segment in a band that has mild myocardial bridging. Otherwise angiographically normal coronary arteries  The left ventricular systolic function is normal. The left ventricular ejection fraction is 50-55% by visual estimate.  LV end diastolic pressure is severely elevated.    Angiographically normal coronary arteries with minimal disease and moderate tortuosity.  No culprit lesion to explain elevated troponin  Severely elevated LVEDP with systolic pressures in the 150s-LVEDP was 34 mmHg.  Consistent with ACUTE DIASTOLIC HEART FAILURE ? Was given 40 mg IV Lasix in the Cath Lab.  May need to consider additional diuretic in the morning.   Echo 01/11/2020 1. Normal LV systolic function; grade 1 diastolic dysfunction.  2. Left  ventricular ejection fraction, by estimation, is 60 to 65%. The  left ventricle has normal function. The left ventricle has no regional  wall motion abnormalities. Left ventricular diastolic parameters are  consistent with Grade I diastolic  dysfunction (impaired relaxation).  3. Right ventricular systolic function is normal. The right ventricular  size is normal.  4. The mitral valve is normal in structure. Trivial mitral valve  regurgitation. No evidence of mitral stenosis.  5. The aortic valve is normal in structure. Aortic valve regurgitation is  not visualized. No aortic stenosis is present.  6. The inferior vena cava is normal in size with greater than 50%  respiratory variability, suggesting right atrial pressure of 3 mmHg.  EKG:  EKG is not ordered today.  Recent Labs: 01/09/2020: ALT 33; BUN 15; Potassium 3.5; Sodium 134 01/10/2020: Creatinine, Ser 0.79; Hemoglobin 13.3; Platelets 253  Recent Lipid Panel    Component Value Date/Time   CHOL  01/08/2010 0445    152        ATP III CLASSIFICATION:  <200     mg/dL   Desirable  202-542  mg/dL   Borderline High  >=706    mg/dL   High          TRIG 237 (H) 01/08/2010 0445   HDL 30 (L) 01/08/2010 0445   CHOLHDL 5.1 01/08/2010 0445   VLDL 31 01/08/2010 0445   LDLCALC  01/08/2010 0445    91        Total Cholesterol/HDL:CHD Risk Coronary Heart Disease Risk Table                     Men   Women  1/2 Average Risk   3.4   3.3  Average Risk       5.0   4.4  2 X Average Risk   9.6   7.1  3 X Average Risk  23.4   11.0        Use the calculated Patient Ratio above and the CHD Risk Table to determine the patient's CHD Risk.        ATP III CLASSIFICATION (LDL):  <100     mg/dL   Optimal  628-315  mg/dL   Near or Above                    Optimal  130-159  mg/dL   Borderline  176-160  mg/dL   High  >737     mg/dL   Very High    Physical Exam:    VS:  BP 136/84   Pulse 98   Ht 5\' 3"  (1.6 m)   Wt 296 lb 9.6 oz (134.5  kg)   SpO2 97%   BMI 52.54 kg/m     Wt Readings from Last 3 Encounters:  01/15/20 296 lb 9.6 oz (134.5 kg)  01/12/20 295 lb 9.6 oz (134.1 kg)  12/08/17 270 lb (122.5 kg)     GEN: obese female in no acute distress HEENT: Normal NECK: No JVD; No carotid bruits LYMPHATICS: No lymphadenopathy CARDIAC: RRR, no murmurs, rubs, gallops RESPIRATORY:  Clear to auscultation without rales, wheezing or rhonchi  ABDOMEN: Soft, non-tender, non-distended MUSCULOSKELETAL:  No edema; No deformity  SKIN: Warm and dry NEUROLOGIC:  Alert and oriented x 3 PSYCHIATRIC:  Normal affect   ASSESSMENT:    1. Essential hypertension, benign   2. Chronic diastolic heart failure (HCC)   3. Dyslipidemia   4. Elevated fasting glucose   5. Numbness of toes    PLAN:    In order of problems listed above:  Chronic diastolic heart failure - 40 mg of Lasix twice daily - 25 mg spironolactone - dizziness upon standing --> orthostatic vitals were normal - question if she is over-diuresed - BNP and BMP today - she just obtained a scales yesterday - continue 40 mg BID lasix x 7 days, then decrease to 40 mg daily - add extra lasix back with weight gain   Hypertension - amlodipine 5 mg, Spiro and Lasix as above - pressure well-controlled today    Risk factor modification Numbness and tingling in left great toe - will check A1c today - palpate pedal pulse  Follow up with me in 1 months.  Medication Adjustments/Labs and Tests Ordered: Current medicines are reviewed at length with the patient today.  Concerns regarding medicines are outlined above.  Orders Placed This Encounter  Procedures  . Basic metabolic panel  . Brain natriuretic peptide  . Hemoglobin A1c   No orders of the defined types were placed in this encounter.   Signed, Ledora Bottcher, PA  01/15/2020 3:21 PM    [facility]

## 2020-01-15 ENCOUNTER — Ambulatory Visit: Payer: BC Managed Care – PPO | Admitting: Physician Assistant

## 2020-01-15 ENCOUNTER — Encounter: Payer: Self-pay | Admitting: Physician Assistant

## 2020-01-15 ENCOUNTER — Other Ambulatory Visit: Payer: Self-pay

## 2020-01-15 VITALS — BP 136/84 | HR 98 | Ht 63.0 in | Wt 296.6 lb

## 2020-01-15 DIAGNOSIS — R7301 Impaired fasting glucose: Secondary | ICD-10-CM | POA: Diagnosis not present

## 2020-01-15 DIAGNOSIS — I5031 Acute diastolic (congestive) heart failure: Secondary | ICD-10-CM | POA: Diagnosis not present

## 2020-01-15 DIAGNOSIS — I10 Essential (primary) hypertension: Secondary | ICD-10-CM | POA: Diagnosis not present

## 2020-01-15 DIAGNOSIS — I248 Other forms of acute ischemic heart disease: Secondary | ICD-10-CM | POA: Diagnosis not present

## 2020-01-15 DIAGNOSIS — I5032 Chronic diastolic (congestive) heart failure: Secondary | ICD-10-CM

## 2020-01-15 DIAGNOSIS — E785 Hyperlipidemia, unspecified: Secondary | ICD-10-CM | POA: Diagnosis not present

## 2020-01-15 DIAGNOSIS — R200 Anesthesia of skin: Secondary | ICD-10-CM

## 2020-01-15 NOTE — Patient Instructions (Signed)
Medication Instructions:   Continue Lasix 40 mg twice daily for one week; then decrease to 40 mg once daily. If your weight increases by 3lbs overnight, or 5 lbs in one week, take Lasix 40 mg twice daily.  *If you need a refill on your cardiac medications before your next appointment, please call your pharmacy*   Lab Work: Your physician recommends that you return for lab work today: BMET, BNP, Hemoglobin A1C  If you have labs (blood work) drawn today and your tests are completely normal, you will receive your results only by: Marland Kitchen MyChart Message (if you have MyChart) OR . A paper copy in the mail If you have any lab test that is abnormal or we need to change your treatment, we will call you to review the results.   Follow-Up: At [facility], you and your health needs are our priority.  As part of our continuing mission to provide you with exceptional heart care, we have created designated Provider Care Teams.  These Care Teams include your primary Cardiologist (physician) and Advanced Practice Providers (APPs -  Physician Assistants and Nurse Practitioners) who all work together to provide you with the care you need, when you need it.  We recommend signing up for the patient portal called "MyChart".  Sign up information is provided on this After Visit Summary.  MyChart is used to connect with patients for Virtual Visits (Telemedicine).  Patients are able to view lab/test results, encounter notes, upcoming appointments, etc.  Non-urgent messages can be sent to your provider as well.   To learn more about what you can do with MyChart, go to [email or web address].    Your next appointment:   1 month(s)  The format for your next appointment:   In Person  Provider:   Micah Flesher, PA   Other Instructions Your physician recommends that you weigh, daily, at the same time every day, and in the same amount of clothing. Please record your daily weights on the handout provided and bring it  to your next appointment.

## 2020-01-16 LAB — HEMOGLOBIN A1C
Est. average glucose Bld gHb Est-mCnc: 111 mg/dL
Hgb A1c MFr Bld: 5.5 % (ref 4.8–5.6)

## 2020-01-16 LAB — BASIC METABOLIC PANEL
BUN/Creatinine Ratio: 14 (ref 9–23)
BUN: 12 mg/dL (ref 6–24)
CO2: 26 mmol/L (ref 20–29)
Calcium: 8.9 mg/dL (ref 8.7–10.2)
Chloride: 98 mmol/L (ref 96–106)
Creatinine, Ser: 0.87 mg/dL (ref 0.57–1.00)
GFR calc Af Amer: 90 mL/min/{1.73_m2} (ref >59)
GFR calc non Af Amer: 78 mL/min/{1.73_m2} (ref >59)
Glucose: 87 mg/dL (ref 65–99)
Potassium: 4.1 mmol/L (ref 3.5–5.2)
Sodium: 140 mmol/L (ref 134–144)

## 2020-01-16 LAB — BRAIN NATRIURETIC PEPTIDE: BNP: <2.5 pg/mL (ref 0.0–100.0)

## 2020-01-20 ENCOUNTER — Telehealth: Payer: Self-pay | Admitting: Physician Assistant

## 2020-01-20 NOTE — Telephone Encounter (Signed)
Patient aware of results and recommendations. Verbalized understanding.  

## 2020-01-20 NOTE — Telephone Encounter (Signed)
   Pt is returning call From [facility] regarding Lab results

## 2020-01-22 ENCOUNTER — Telehealth: Payer: Self-pay | Admitting: Cardiovascular Disease

## 2020-01-22 ENCOUNTER — Encounter: Payer: Self-pay | Admitting: Cardiovascular Disease

## 2020-01-22 NOTE — Telephone Encounter (Signed)
Duplicate encounter. Mychart encounter is open.

## 2020-01-22 NOTE — Telephone Encounter (Signed)
Patient states that she needs a note from our office that states she is able to return back to work with no restrictions.

## 2020-01-23 NOTE — Telephone Encounter (Signed)
Patient calling back stating she cannot come to pick up the work note and is requesting the note be emailed. She states her office does not have a fax number just an email. The email is: [email or web address]

## 2020-01-23 NOTE — Telephone Encounter (Signed)
Letter sent by fax machine to email as requested.

## 2020-01-29 ENCOUNTER — Telehealth (HOSPITAL_BASED_OUTPATIENT_CLINIC_OR_DEPARTMENT_OTHER): Payer: Self-pay

## 2020-01-29 NOTE — Telephone Encounter (Signed)
Called pt to notify her that she had left a pain of black boots and a prescription receipt at [facility], pt advises to throw away items.

## 2020-02-04 DIAGNOSIS — I10 Essential (primary) hypertension: Secondary | ICD-10-CM | POA: Diagnosis not present

## 2020-02-04 DIAGNOSIS — R319 Hematuria, unspecified: Secondary | ICD-10-CM | POA: Diagnosis not present

## 2020-02-04 DIAGNOSIS — I5031 Acute diastolic (congestive) heart failure: Secondary | ICD-10-CM | POA: Diagnosis not present

## 2020-02-13 NOTE — Progress Notes (Deleted)
Cardiology Office Note:    Date:  02/13/2020   ID:  Teresa Valenzuela, DOB April 11, 1971, MRN [id number]  PCP:  Patient, No Pcp Per  Cardiologist:  Thurmon Fair, MD   Referring MD: No ref. provider found   No chief complaint on file. ***  History of Present Illness:    Teresa Valenzuela is a 49 y.o. female with a hx of chronic diastolic heart failure, HTN, HLD, morbid obesity, remote hx of gallstone pancreatitis and cholecystectomy.  She has a history of a normal stress echo in 2015, performed for ER visit to evaluate chest pain.  Since then she has not routinely followed up with cardiology.  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  High-sensitivity troponins elevated from 44 --> 103 -- > 252 --> 708.  CTA negative for PE.  She was transferred to [facility] for angiography.  Left heart catheterization revealed essentially normal coronaries with myocardial bridge appreciated at the mid to distal LAD.  However, LVEDP was severely elevated suggesting acute diastolic heart failure.  Echocardiogram the next day revealed normal EF with grade 1 diastolic dysfunction and no significant valvular disease.  She was diuresed throughout her hospitalization and discharged on 40 mg Lasix twice daily and 25 mg spironolactone with close cardiology follow-up.  Of note her discharge weight was 295 pounds from 310 pounds on admission.  6 months ago her weight was 270 pounds.  She takes 81 mg aspirin. I saw her for TOC appt on 01/15/20 and she had continued dizziness upon standing. Orthostatic vitals were normal. She continued 40 mg BID lasix x 7 days then reduced to 40 mg daily. She was instructed to use PRN lasix if needed.  BMP at that visit with sCr and K WNL, BNP normal. A1c WNL.  She presents back today for follow up. May have been dizzy due to over-diuresis. Daily weights show       Chronic diastolic  heart failure - 40 mg of Lasix daily - 25 mg spironolactone -    Hypertension - amlodipine 5 mg, Spiro and Lasix as above   Dizziness     Past Medical History:  Diagnosis Date   Chronic diastolic CHF 01/09/2020   Pt admx with CP and elevated Tn in 01/2020 >> Cath w NL coronary arteries, mid LAD intramyocardial bridge, LVEDP 34 // Echo 01/2020: EF 60-65, GR 1 DD   Dyslipidemia 01/12/2020   Low HDL   Hypertension    Ovarian cyst    Pancreatic cyst    Pancreatitis     Past Surgical History:  Procedure Laterality Date   CHOLECYSTECTOMY     LEFT HEART CATH AND CORONARY ANGIOGRAPHY N/A 01/10/2020   Procedure: LEFT HEART CATH AND CORONARY ANGIOGRAPHY;  Surgeon: Marykay Lex, MD;  Location: [facility] INVASIVE CV LAB;  Service: Cardiovascular;  Laterality: N/A;   TUBAL LIGATION      Current Medications: No outpatient medications have been marked as taking for the 02/21/20 encounter (Appointment) with Marcelino Duster, PA.     Allergies:   Zofran, Metoclopramide, and Ondansetron   Social History   Socioeconomic History   Marital status: Married    Spouse name: Not on file   Number of children: Not on file   Years of education: Not on file   Highest education level: Not on file  Occupational History   Not on file  Tobacco  Use   Smoking status: Never Smoker   Smokeless tobacco: Never Used  Substance and Sexual Activity   Alcohol use: No   Drug use: Yes    Types: Marijuana   Sexual activity: Not on file  Other Topics Concern   Not on file  Social History Narrative   Not on file   Social Determinants of Health   Financial Resource Strain:    Difficulty of Paying Living Expenses:   Food Insecurity:    Worried About Programme researcher, broadcasting/film/video in the Last Year:    Barista in the Last Year:   Transportation Needs:    Freight forwarder (Medical):    Lack of Transportation (Non-Medical):   Physical Activity:    Days of Exercise per  Week:    Minutes of Exercise per Session:   Stress:    Feeling of Stress :   Social Connections:    Frequency of Communication with Friends and Family:    Frequency of Social Gatherings with Friends and Family:    Attends Religious Services:    Active Member of Clubs or Organizations:    Attends Banker Meetings:    Marital Status:      Family History: The patient's ***family history is negative for CAD.  ROS:   Please see the history of present illness.    *** All other systems reviewed and are negative.  EKGs/Labs/Other Studies Reviewed:    The following studies were reviewed today:  Echo 01/11/20: 1. Normal LV systolic function; grade 1 diastolic dysfunction.  2. Left ventricular ejection fraction, by estimation, is 60 to 65%. The  left ventricle has normal function. The left ventricle has no regional  wall motion abnormalities. Left ventricular diastolic parameters are  consistent with Grade I diastolic  dysfunction (impaired relaxation).  3. Right ventricular systolic function is normal. The right ventricular  size is normal.  4. The mitral valve is normal in structure. Trivial mitral valve  regurgitation. No evidence of mitral stenosis.  5. The aortic valve is normal in structure. Aortic valve regurgitation is  not visualized. No aortic stenosis is present.  6. The inferior vena cava is normal in size with greater than 50%  respiratory variability, suggesting right atrial pressure of 3 mmHg.   Left heart cath 01/10/20:  Mid LAD to Dist LAD short segment in a band that has mild myocardial bridging. Otherwise angiographically normal coronary arteries  The left ventricular systolic function is normal. The left ventricular ejection fraction is 50-55% by visual estimate.  LV end diastolic pressure is severely elevated.    Angiographically normal coronary arteries with minimal disease and moderate tortuosity.  No culprit lesion to explain  elevated troponin  Severely elevated LVEDP with systolic pressures in the 150s-LVEDP was 34 mmHg.  Consistent with ACUTE DIASTOLIC HEART FAILURE ? Was given 40 mg IV Lasix in the Cath Lab.  May need to consider additional diuretic in the morning.   EKG:  EKG is *** ordered today.  The ekg ordered today demonstrates ***  Recent Labs: 01/09/2020: ALT 33 01/10/2020: Hemoglobin 13.3; Platelets 253 01/15/2020: BNP <2.5; BUN 12; Creatinine, Ser 0.87; Potassium 4.1; Sodium 140  Recent Lipid Panel    Component Value Date/Time   CHOL  01/08/2010 0445    152        ATP III CLASSIFICATION:  <200     mg/dL   Desirable  601-093  mg/dL   Borderline High  >=235  mg/dL   High          TRIG 153 (H) 01/08/2010 0445   HDL 30 (L) 01/08/2010 0445   CHOLHDL 5.1 01/08/2010 0445   VLDL 31 01/08/2010 0445   LDLCALC  01/08/2010 0445    91        Total Cholesterol/HDL:CHD Risk Coronary Heart Disease Risk Table                     Men   Women  1/2 Average Risk   3.4   3.3  Average Risk       5.0   4.4  2 X Average Risk   9.6   7.1  3 X Average Risk  23.4   11.0        Use the calculated Patient Ratio above and the CHD Risk Table to determine the patient's CHD Risk.        ATP III CLASSIFICATION (LDL):  <100     mg/dL   Optimal  100-129  mg/dL   Near or Above                    Optimal  130-159  mg/dL   Borderline  160-189  mg/dL   High  >190     mg/dL   Very High    Physical Exam:    VS:  There were no vitals taken for this visit.    Wt Readings from Last 3 Encounters:  01/15/20 296 lb 9.6 oz (134.5 kg)  01/12/20 295 lb 9.6 oz (134.1 kg)  12/08/17 270 lb (122.5 kg)     GEN: *** Well nourished, well developed in no acute distress HEENT: Normal NECK: No JVD; No carotid bruits LYMPHATICS: No lymphadenopathy CARDIAC: ***RRR, no murmurs, rubs, gallops RESPIRATORY:  Clear to auscultation without rales, wheezing or rhonchi  ABDOMEN: Soft, non-tender, non-distended MUSCULOSKELETAL:  No  edema; No deformity  SKIN: Warm and dry NEUROLOGIC:  Alert and oriented x 3 PSYCHIATRIC:  Normal affect   ASSESSMENT:    No diagnosis found. PLAN:    In order of problems listed above:  No diagnosis found.   Medication Adjustments/Labs and Tests Ordered: Current medicines are reviewed at length with the patient today.  Concerns regarding medicines are outlined above.  No orders of the defined types were placed in this encounter.  No orders of the defined types were placed in this encounter.   Signed, Ledora Bottcher, PA  02/13/2020 3:00 PM    [facility]

## 2020-02-21 ENCOUNTER — Ambulatory Visit: Payer: BC Managed Care – PPO | Admitting: Physician Assistant

## 2020-03-26 IMAGING — CT CT ANGIO CHEST-ABD-PELV FOR DISSECTION W/ AND WO/W CM
2 of 9 series · 13 of 46 positions shown, 15 images · IV contrast (iopamidol)
Comparison: CT abdomen dated 03/19/2016. Chest x-ray from earlier
same day.

CLINICAL DATA: Left-sided chest pain for 1 week, worse with
movement, intermittent.

EXAM:
CT ANGIOGRAPHY CHEST, ABDOMEN AND PELVIS
TECHNIQUE: Multidetector CT imaging through the chest, abdomen and pelvis was
performed using the standard protocol during bolus administration of
intravenous contrast. Multiplanar reconstructed images and MIPs were
obtained and reviewed to evaluate the vascular anatomy.
CONTRAST:  100mL KP65QT-PPD IOPAMIDOL (KP65QT-PPD) INJECTION 76%

[Series 6: axial arterial · axial · arterial · 0.78mm/px · z∈[-616,-40]mm · 10 of 214 slices shown, 12 images]
[im 11/214  soft-tissue]
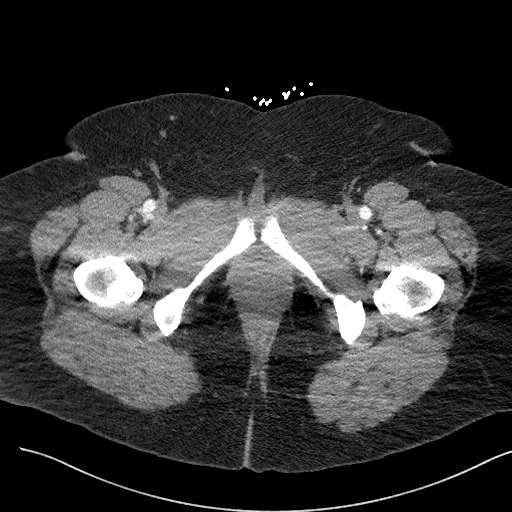
[im 11/214  bone]
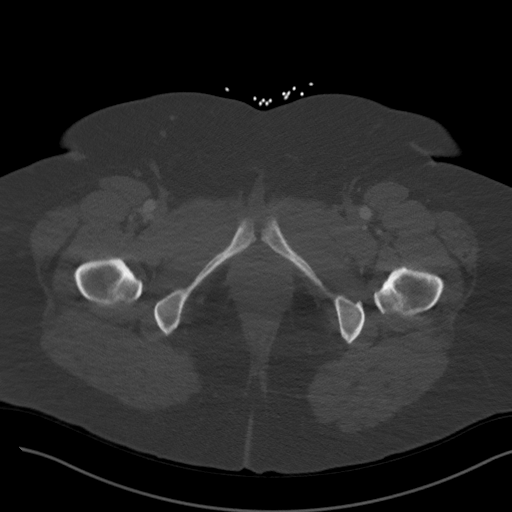
[im 32/214  soft-tissue]
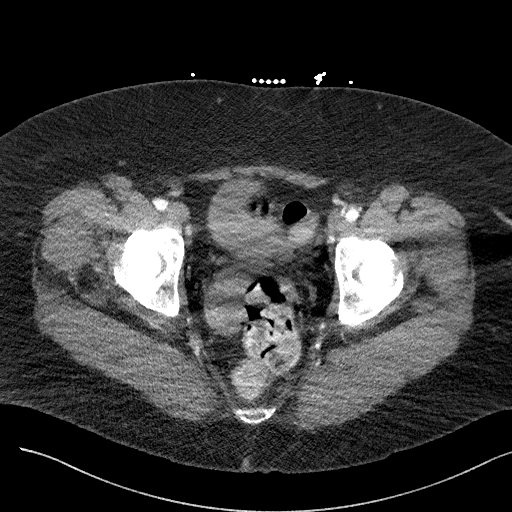
[im 54/214  soft-tissue]
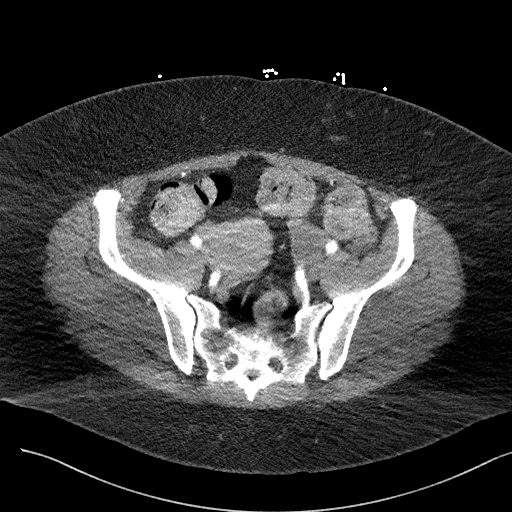
[im 75/214  soft-tissue]
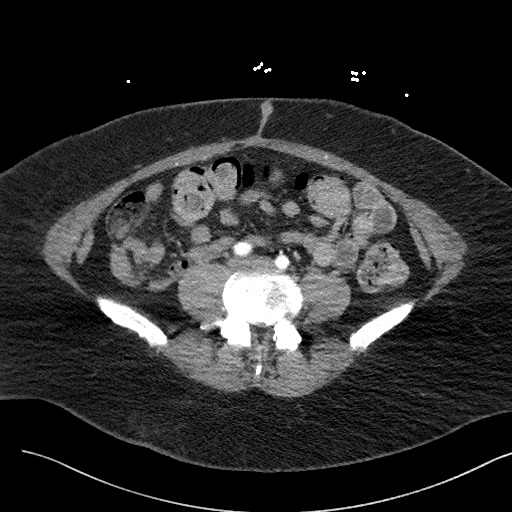
[im 96/214  soft-tissue]
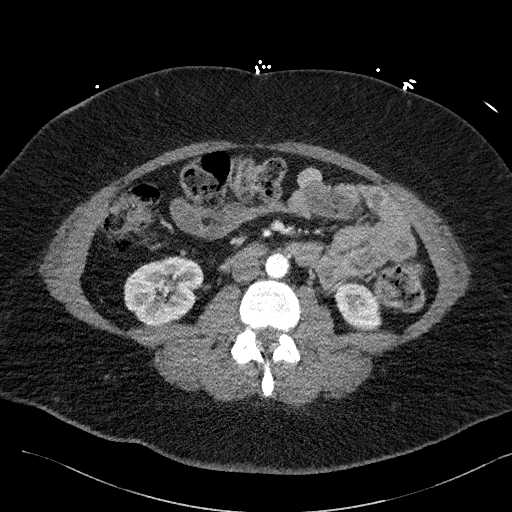
[im 118/214  soft-tissue]
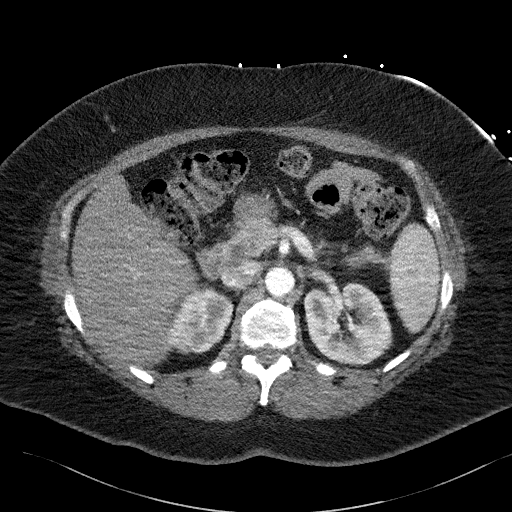
[im 139/214  soft-tissue]
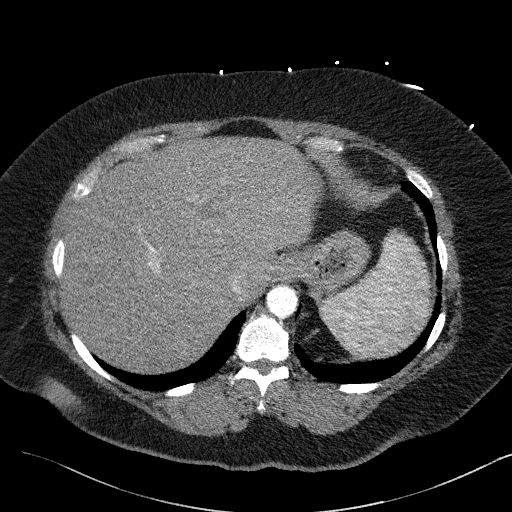
[im 160/214  soft-tissue]
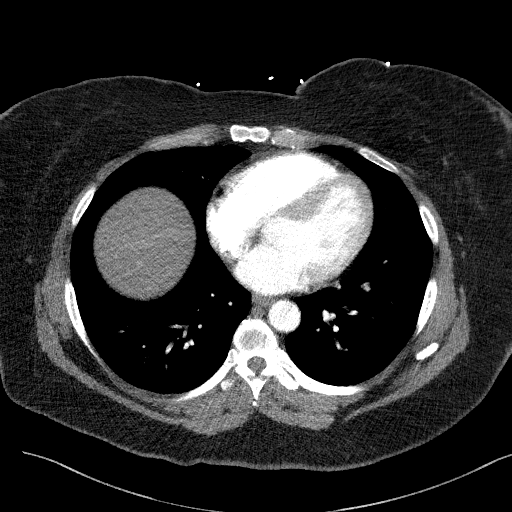
[im 182/214  soft-tissue]
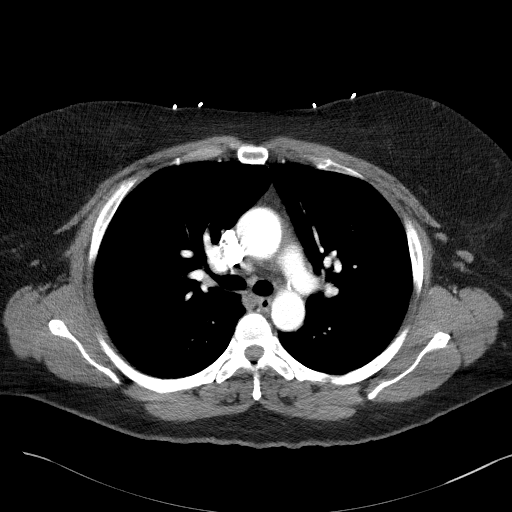
[im 182/214  bone]
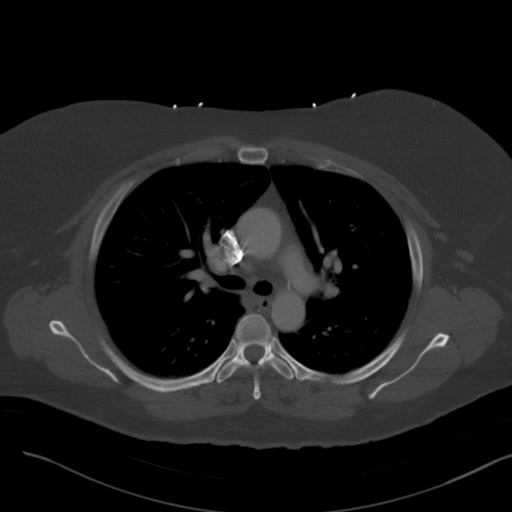
[im 203/214  soft-tissue]
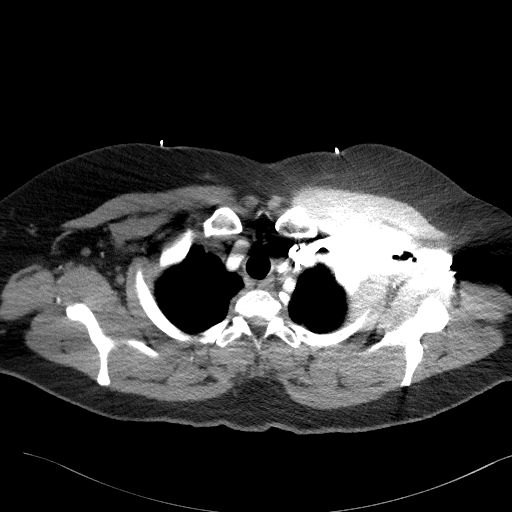

[Series 9: coronals · coronal · 0.89mm/px · 3 of 138 slices shown]
[im 35/138  soft-tissue]
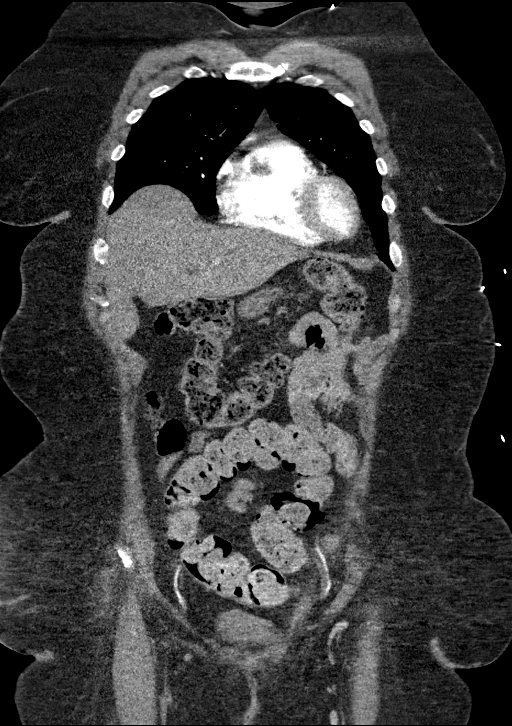
[im 69/138  soft-tissue]
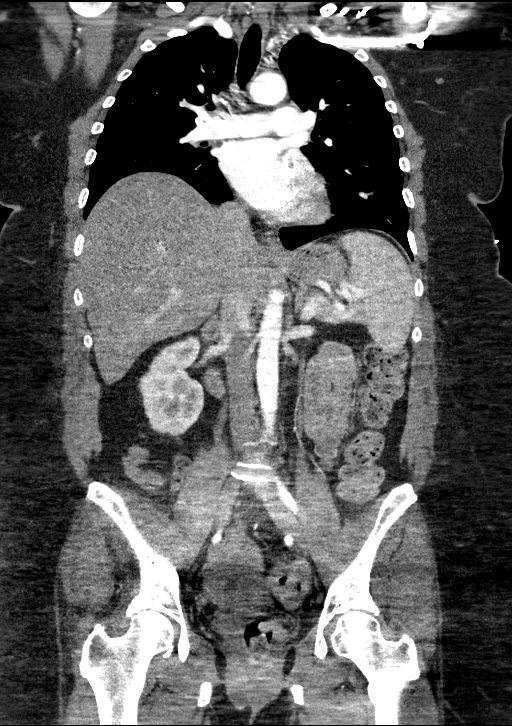
[im 103/138  soft-tissue]
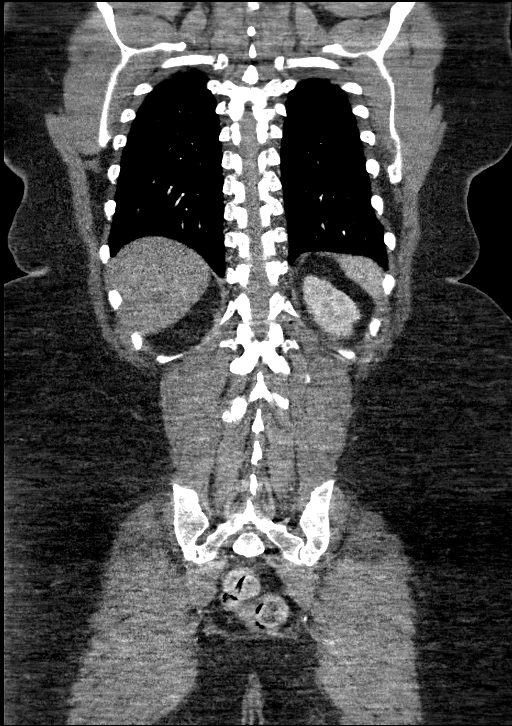

[13 of 46 positions shown; findings below may reference images not displayed]

FINDINGS: CTA CHEST FINDINGS

Cardiovascular: Thoracic aorta is normal in caliber and
configuration. No thoracic aortic aneurysm or dissection. Heart size
is normal. No pericardial effusion. No central obstructing pulmonary
embolism.

Mediastinum/Nodes: No mass or enlarged lymph nodes within the
mediastinum or perihilar regions. Esophagus appears normal. Trachea
and central bronchi appear normal.

Lungs/Pleura: Lungs are clear.  No pleural effusion or pneumothorax.

Musculoskeletal: Osseous structures about the chest are
unremarkable. No acute or suspicious osseous finding. Superficial
soft tissues are unremarkable.

Review of the MIP images confirms the above findings.

CTA ABDOMEN AND PELVIS FINDINGS

VASCULAR

Aorta: Normal caliber aorta without aneurysm, dissection, vasculitis
or significant stenosis.

Celiac: Patent without evidence of aneurysm, dissection, vasculitis
or significant stenosis.

SMA: Patent without evidence of aneurysm, dissection, vasculitis or
significant stenosis.

Renals: Both renal arteries are patent without evidence of aneurysm,
dissection, vasculitis, fibromuscular dysplasia or significant
stenosis.

IMA: Patent without evidence of aneurysm, dissection, vasculitis or
significant stenosis.

Inflow: Patent without evidence of aneurysm, dissection, vasculitis
or significant stenosis.

Veins: No obvious venous abnormality within the limitations of this
arterial phase study.

Review of the MIP images confirms the above findings.

NON-VASCULAR

Hepatobiliary: No focal liver abnormality is seen. Status post
cholecystectomy with expected bile duct ectasia.

Pancreas: Unremarkable. No pancreatic ductal dilatation or
surrounding inflammatory changes.

Spleen: Normal in size without focal abnormality.

Adrenals/Urinary Tract: Adrenal glands are unremarkable. Kidneys
appear normal without mass, stone or hydronephrosis. No perinephric
inflammation. No ureteral or bladder calculi identified. Bladder
appears normal, partially decompressed.

Stomach/Bowel: Bowel is normal in caliber. Fairly large amount of
stool throughout the nondistended colon. No bowel wall thickening or
evidence of bowel wall inflammation. Appendix is normal. Stomach is
unremarkable, partially decompressed.

Lymphatic: No significant vascular findings are present. No enlarged
abdominal or pelvic lymph nodes.

Reproductive: Uterus and right adnexal regions are unremarkable.
Again noted is a 3 cm hypodense mass in the left adnexal region,
cystic appearance on earlier CT of 03/19/2016, stable.

Other: No free fluid or abscess collection. No free intraperitoneal
air.

Musculoskeletal: Degenerative changes at the L4-5 and L5-S1 levels,
at least moderate in degree with moderate central canal stenosis and
suspected nerve root impingement. No acute or suspicious osseous
finding. Superficial soft tissues are unremarkable.

Review of the MIP images confirms the above findings.
IMPRESSION: 1. No acute findings within the chest, abdomen or pelvis. No
thoracic or abdominal aortic aneurysm or dissection.
2. Fairly large amount of stool throughout the nondistended colon
(constipation?).
3. **An incidental finding of potential clinical significance has
been found. Stable 3 cm hypodense mass in the left adnexal region,
cystic appearance on earlier CT of 03/19/2016. The nearly 2 year
stability suggests benignity but would consider further
characterization with nonemergent pelvic ultrasound to ensure
benignity.**
4. Degenerative changes in the lower lumbar spine, at least moderate
in degree with suspected nerve root impingement. If any
radiculopathic symptoms, would consider further characterization
with nonemergent lumbar spine MRI.

## 2020-03-26 IMAGING — CR DG CHEST 2V
2 series · 2 of 2 positions shown · non-contrast
Comparison: 06/08/2017 and 01/13/2015 radiographs.

CLINICAL DATA: Chest pain and nausea for 1 week.  Nonsmoker.

EXAM:
CHEST - 2 VIEW

[w chest pa]
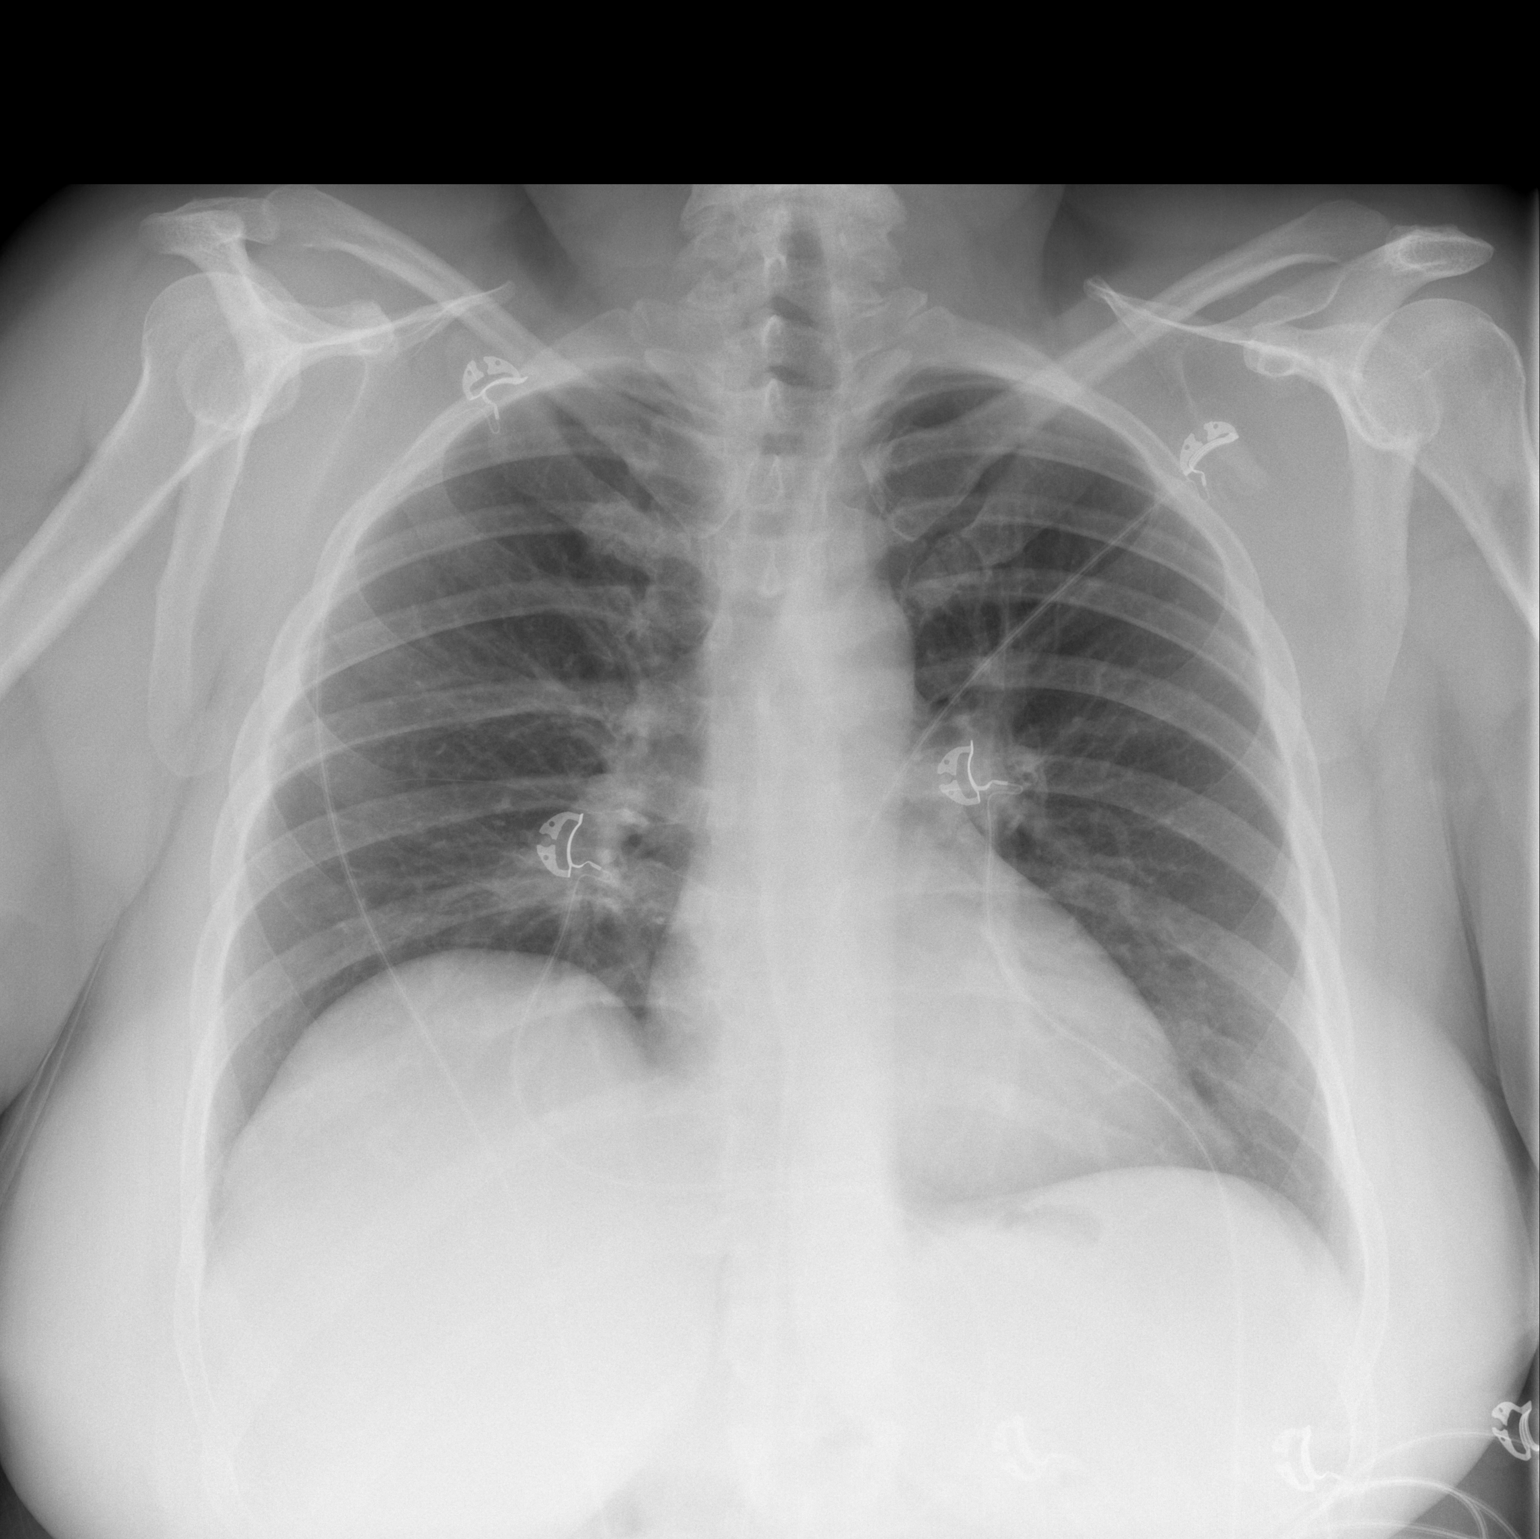

[w chest lat]
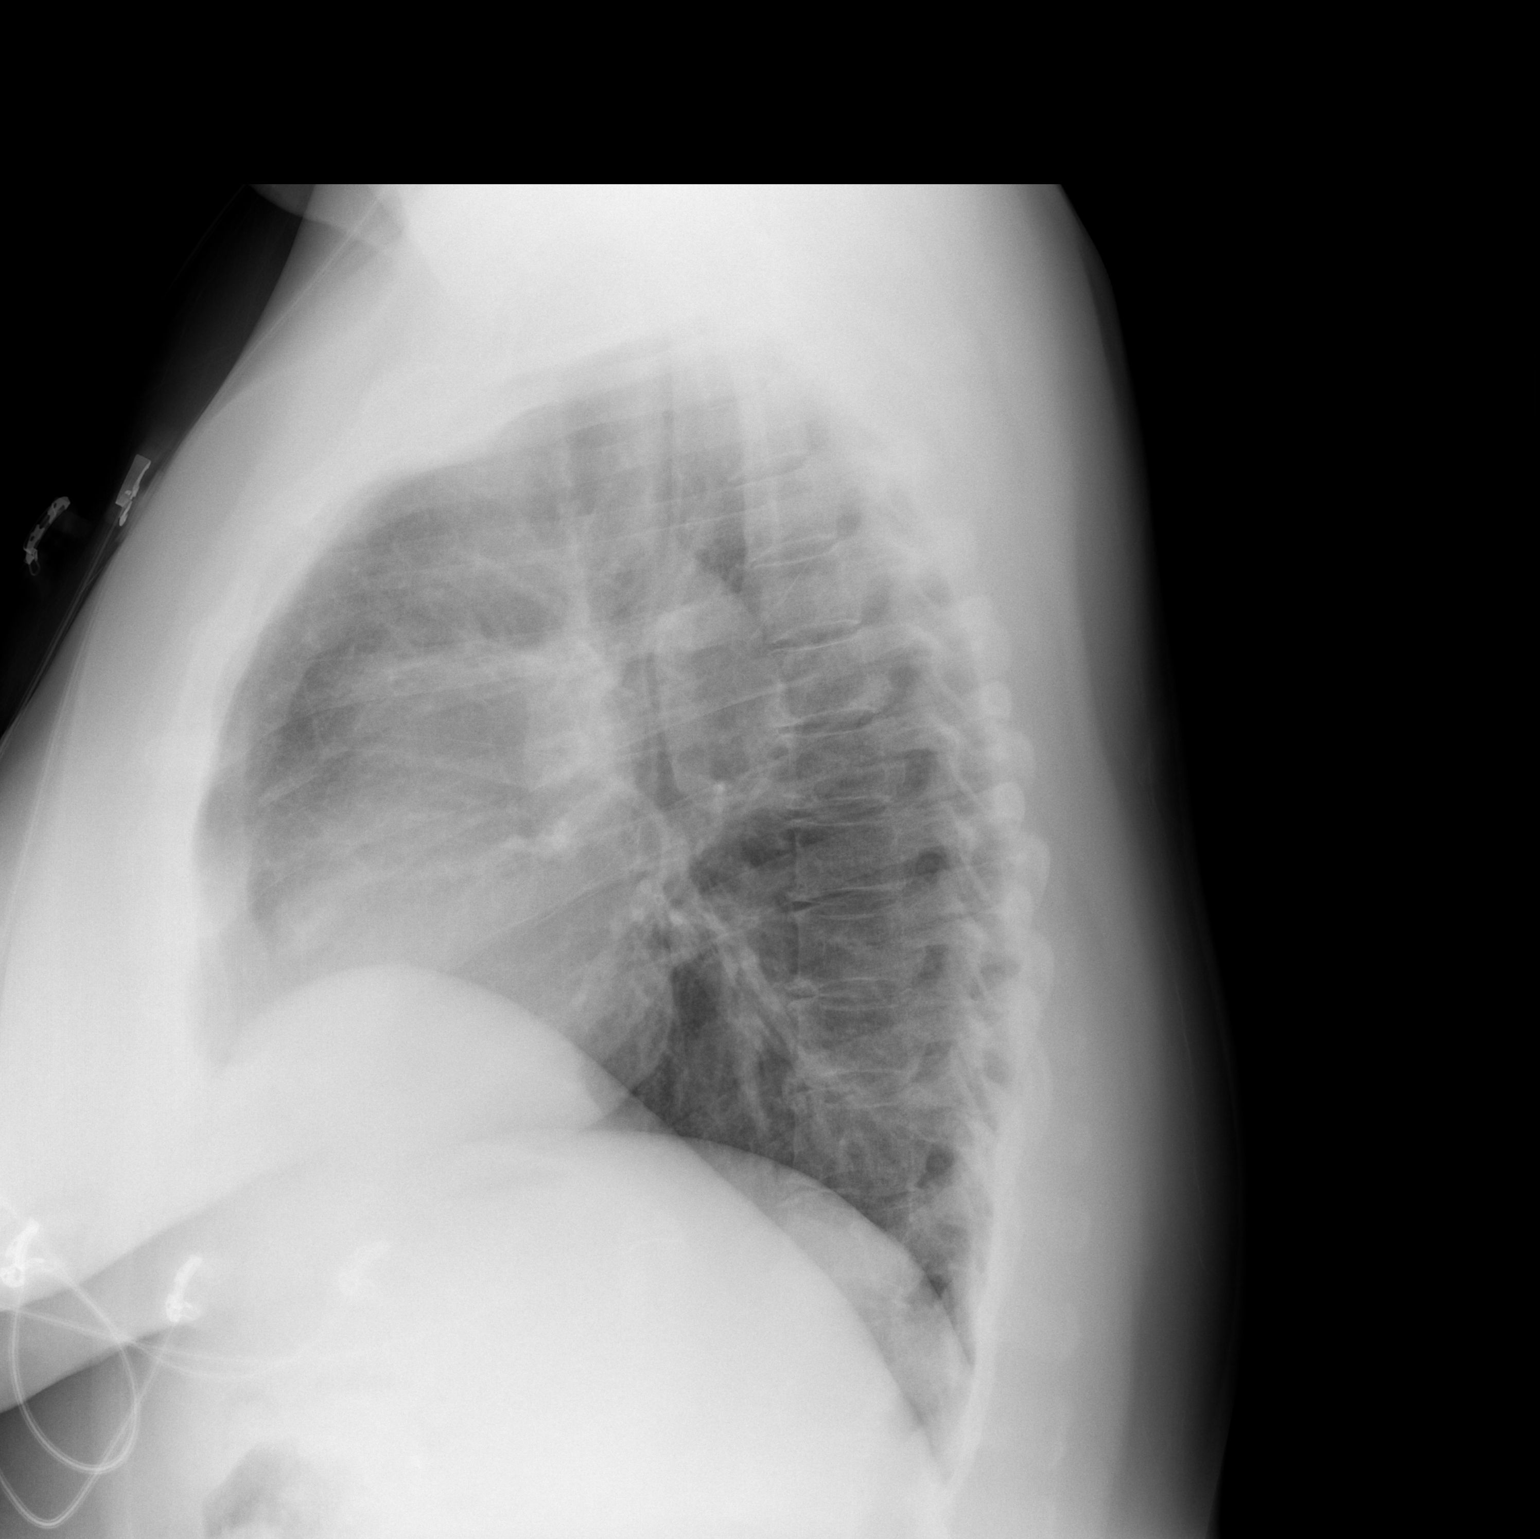

[2 of 2 positions shown; findings below may reference images not displayed]

FINDINGS: Stable eventration of the right hemidiaphragm anteriorly. The heart
size and mediastinal contours are normal. The lungs are clear. There
is no pleural effusion or pneumothorax. No acute osseous findings
are identified. Telemetry leads overlie the chest.
IMPRESSION: Stable chest.  No active cardiopulmonary process.

## 2020-06-04 DIAGNOSIS — Z1239 Encounter for other screening for malignant neoplasm of breast: Secondary | ICD-10-CM | POA: Diagnosis not present

## 2020-06-04 DIAGNOSIS — R102 Pelvic and perineal pain: Secondary | ICD-10-CM | POA: Diagnosis not present

## 2020-06-05 ENCOUNTER — Other Ambulatory Visit: Payer: Self-pay | Admitting: Nurse Practitioner

## 2020-06-05 DIAGNOSIS — Z1231 Encounter for screening mammogram for malignant neoplasm of breast: Secondary | ICD-10-CM

## 2020-06-05 DIAGNOSIS — R102 Pelvic and perineal pain: Secondary | ICD-10-CM

## 2020-06-16 ENCOUNTER — Other Ambulatory Visit: Payer: BC Managed Care – PPO

## 2020-06-17 ENCOUNTER — Other Ambulatory Visit: Payer: BC Managed Care – PPO

## 2020-06-22 ENCOUNTER — Ambulatory Visit: Payer: BC Managed Care – PPO

## 2020-06-25 ENCOUNTER — Ambulatory Visit
Admission: RE | Admit: 2020-06-25 | Discharge: 2020-06-25 | Disposition: A | Discharge status: Home / Self Care | Payer: BC Managed Care – PPO | Source: Ambulatory Visit | Attending: Nurse Practitioner | Admitting: Nurse Practitioner

## 2020-06-25 DIAGNOSIS — N8500 Endometrial hyperplasia, unspecified: Secondary | ICD-10-CM | POA: Diagnosis not present

## 2020-06-25 DIAGNOSIS — R102 Pelvic and perineal pain: Secondary | ICD-10-CM

## 2020-06-25 DIAGNOSIS — R9389 Abnormal findings on diagnostic imaging of other specified body structures: Secondary | ICD-10-CM | POA: Diagnosis not present

## 2020-06-29 DIAGNOSIS — Z124 Encounter for screening for malignant neoplasm of cervix: Secondary | ICD-10-CM | POA: Diagnosis not present

## 2020-06-29 DIAGNOSIS — Z01419 Encounter for gynecological examination (general) (routine) without abnormal findings: Secondary | ICD-10-CM | POA: Diagnosis not present

## 2020-07-16 ENCOUNTER — Ambulatory Visit: Payer: Self-pay | Admitting: Obstetrics and Gynecology

## 2020-08-04 ENCOUNTER — Ambulatory Visit (INDEPENDENT_AMBULATORY_CARE_PROVIDER_SITE_OTHER): Payer: BC Managed Care – PPO | Admitting: Obstetrics and Gynecology

## 2020-08-04 ENCOUNTER — Other Ambulatory Visit: Payer: Self-pay

## 2020-08-04 ENCOUNTER — Encounter: Payer: Self-pay | Admitting: Obstetrics and Gynecology

## 2020-08-04 VITALS — BP 126/82 | Ht 65.0 in | Wt 286.0 lb

## 2020-08-04 DIAGNOSIS — R87810 Cervical high risk human papillomavirus (HPV) DNA test positive: Secondary | ICD-10-CM

## 2020-08-04 DIAGNOSIS — N72 Inflammatory disease of cervix uteri: Secondary | ICD-10-CM

## 2020-08-04 DIAGNOSIS — R87618 Other abnormal cytological findings on specimens from cervix uteri: Secondary | ICD-10-CM | POA: Diagnosis not present

## 2020-08-04 NOTE — Progress Notes (Signed)
Teresa Valenzuela [birth year] [id number]  SUBJECTIVE:  49 y.o. [id number] female referred by her primary care provider for evaluation of an abnormal Pap smear.  The printed results from her clinic in [place] indicates "atypical cells present" and that "HPV is positive". (Classification of atypical cells was not specified).  Patient has not had a lot of health care until more recently when she was able to get better health insurance so she does not recall she has had any abnormal Pap smears.  She says she is not a smoker.  She has had a previous tubal ligation.  She sporadically gets light menses and just started a light period days ago but is not having any heavy flow.  Current Outpatient Medications  Medication Sig Dispense Refill  . acetaminophen (TYLENOL) 325 MG tablet Take 2 tablets (650 mg total) by mouth every 4 (four) hours as needed for headache or mild pain.    Marland Kitchen amLODipine (NORVASC) 5 MG tablet Take 5 mg by mouth daily.     Marland Kitchen aspirin 81 MG tablet Take 81 mg by mouth daily.    . furosemide (LASIX) 40 MG tablet Take 1 tablet (40 mg total) by mouth 2 (two) times daily. 180 tablet 3  . methadone (DOLOPHINE) 10 MG/ML solution Take 65 mg by mouth daily.    . nitroGLYCERIN (NITROSTAT) 0.4 MG SL tablet Place 1 tablet (0.4 mg total) under the tongue every 5 (five) minutes as needed for chest pain. 25 tablet 12  . promethazine (PHENERGAN) 25 MG tablet Take 25 mg by mouth every 6 (six) hours as needed for nausea or vomiting.     Marland Kitchen spironolactone (ALDACTONE) 25 MG tablet Take 1 tablet (25 mg total) by mouth daily. 30 tablet 11   No current facility-administered medications for this visit.   Allergies: Zofran, Metoclopramide, and Ondansetron  Patient's last menstrual period was 08/02/2020.  Past medical history,surgical history, problem list, medications, allergies, family history and social history were all reviewed and documented as reviewed in the EPIC chart.   OBJECTIVE:  BP 126/82   Ht  5\' 5"  (1.651 m)   Wt 286 lb (129.7 kg)   LMP 08/02/2020   BMI 47.59 kg/m  The patient appears well, alert, oriented x 3, in no distress.  PELVIC EXAM: VULVA: normal appearing vulva with no masses, tenderness or lesions, VAGINA: normal appearing vagina with normal color and scant menstrual discharge, no lesions, CERVIX: normal appearing patulous cervix without discharge or lesions, UTERUS: uterus is normal size, shape, consistency and nontender, ADNEXA: normal adnexa in size, nontender and no masses  Physical Exam Genitourinary:        Procedure note Colposcopy The cervix was viewed with a Graves speculum.  Light menstrual blood was wiped away and there is no active bleeding.  Dilute acetic acid cleanse was performed.  Lugol solution was applied.  There was a circular acetowhite lesion at the transformation zone just outside of it, this area was biopsied with a Tischler biopsy forceps, and just a few small fragments were obtained.  Endocervical curettings were collected with a Kevorkian curette and Cytobrush.  Exam is adequate with visualization of the transformation zone circumferentially.  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  Application of pressure and silver nitrate cautery resulted in hemostasis.  Patient tolerated the procedure well. Chaperone: 08/04/2020 present during the examination  ASSESSMENT:  49 y.o. 49 here for colposcopy examination for an abnormal Pap smear  PLAN:  The Pap smear report was not clear  and concise about the type of atypia present on her specimen.  Presuming it was an ASCUS pathology with positive high-risk HPV, we proceeded to colposcopy today.  I will have staff contact her primary clinic to see if we can get more definition on the Pap smear cytology result, because if there were atypical endometrial cells then we would also need to perform an endometrial biopsy in that case. We discussed the role of HPV in mediating  dysplastic changes of the cervix.  I told the patient that my impression today is she likely has mild dysplasia which typically will resolve on its own but we will wait for the pathology report to confirm that.  All questions were answered by the end of the visit.   [name] MD 08/04/20

## 2020-08-07 LAB — TISSUE PATH REPORT

## 2020-08-07 LAB — PATHOLOGY REPORT

## 2020-08-08 ENCOUNTER — Encounter: Payer: Self-pay | Admitting: Obstetrics and Gynecology

## 2021-02-12 DIAGNOSIS — D509 Iron deficiency anemia, unspecified: Secondary | ICD-10-CM | POA: Diagnosis not present

## 2021-02-12 DIAGNOSIS — R109 Unspecified abdominal pain: Secondary | ICD-10-CM | POA: Diagnosis not present

## 2021-02-12 DIAGNOSIS — R300 Dysuria: Secondary | ICD-10-CM | POA: Diagnosis not present

## 2021-02-12 DIAGNOSIS — R202 Paresthesia of skin: Secondary | ICD-10-CM | POA: Diagnosis not present

## 2021-02-12 DIAGNOSIS — I10 Essential (primary) hypertension: Secondary | ICD-10-CM | POA: Diagnosis not present

## 2021-10-22 ENCOUNTER — Emergency Department (HOSPITAL_BASED_OUTPATIENT_CLINIC_OR_DEPARTMENT_OTHER)
Admission: EM | Admit: 2021-10-22 | Discharge: 2021-10-22 | Disposition: A | Discharge status: Home / Self Care | Payer: 59 | Attending: Emergency Medicine | Admitting: Emergency Medicine

## 2021-10-22 ENCOUNTER — Emergency Department (HOSPITAL_BASED_OUTPATIENT_CLINIC_OR_DEPARTMENT_OTHER): Payer: 59 | Admitting: Radiology

## 2021-10-22 ENCOUNTER — Other Ambulatory Visit: Payer: Self-pay

## 2021-10-22 ENCOUNTER — Encounter (HOSPITAL_BASED_OUTPATIENT_CLINIC_OR_DEPARTMENT_OTHER): Payer: Self-pay | Admitting: Obstetrics and Gynecology

## 2021-10-22 ENCOUNTER — Emergency Department (HOSPITAL_BASED_OUTPATIENT_CLINIC_OR_DEPARTMENT_OTHER): Payer: 59

## 2021-10-22 DIAGNOSIS — Z7982 Long term (current) use of aspirin: Secondary | ICD-10-CM | POA: Diagnosis not present

## 2021-10-22 DIAGNOSIS — R1084 Generalized abdominal pain: Secondary | ICD-10-CM

## 2021-10-22 DIAGNOSIS — Z79899 Other long term (current) drug therapy: Secondary | ICD-10-CM | POA: Insufficient documentation

## 2021-10-22 DIAGNOSIS — R1013 Epigastric pain: Secondary | ICD-10-CM | POA: Diagnosis present

## 2021-10-22 DIAGNOSIS — N12 Tubulo-interstitial nephritis, not specified as acute or chronic: Secondary | ICD-10-CM | POA: Diagnosis not present

## 2021-10-22 DIAGNOSIS — R109 Unspecified abdominal pain: Secondary | ICD-10-CM

## 2021-10-22 DIAGNOSIS — Z20822 Contact with and (suspected) exposure to covid-19: Secondary | ICD-10-CM | POA: Diagnosis not present

## 2021-10-22 DIAGNOSIS — R112 Nausea with vomiting, unspecified: Secondary | ICD-10-CM

## 2021-10-22 LAB — COMPREHENSIVE METABOLIC PANEL
ALT: 33 U/L (ref 0–44)
AST: 38 U/L (ref 15–41)
Albumin: 4.2 g/dL (ref 3.5–5.0)
Alkaline Phosphatase: 77 U/L (ref 38–126)
Anion gap: 11 (ref 5–15)
BUN: 16 mg/dL (ref 6–20)
CO2: 23 mmol/L (ref 22–32)
Calcium: 9.3 mg/dL (ref 8.9–10.3)
Chloride: 100 mmol/L (ref 98–111)
Creatinine, Ser: 0.6 mg/dL (ref 0.44–1.00)
GFR, Estimated: >60 mL/min (ref >60)
Glucose, Bld: 89 mg/dL (ref 70–99)
Potassium: 4.8 mmol/L (ref 3.5–5.1)
Sodium: 134 mmol/L — ABNORMAL LOW (ref 135–145)
Total Bilirubin: 0.7 mg/dL (ref 0.3–1.2)
Total Protein: 8.1 g/dL (ref 6.5–8.1)

## 2021-10-22 LAB — PROTIME-INR
INR: 1 (ref 0.8–1.2)
Prothrombin Time: 13.2 seconds (ref 11.4–15.2)

## 2021-10-22 LAB — URINALYSIS, ROUTINE W REFLEX MICROSCOPIC
Bilirubin Urine: NEGATIVE
Glucose, UA: NEGATIVE mg/dL
Ketones, ur: NEGATIVE mg/dL
Nitrite: NEGATIVE
Specific Gravity, Urine: 1.025 (ref 1.005–1.030)
pH: 5.5 (ref 5.0–8.0)

## 2021-10-22 LAB — CBC
HCT: 40.6 % (ref 36.0–46.0)
Hemoglobin: 13.2 g/dL (ref 12.0–15.0)
MCH: 29.1 pg (ref 26.0–34.0)
MCHC: 32.5 g/dL (ref 30.0–36.0)
MCV: 89.4 fL (ref 80.0–100.0)
Platelets: 279 10*3/uL (ref 150–400)
RBC: 4.54 MIL/uL (ref 3.87–5.11)
RDW: 13.1 % (ref 11.5–15.5)
WBC: 12.2 10*3/uL — ABNORMAL HIGH (ref 4.0–10.5)
nRBC: 0 % (ref 0.0–0.2)

## 2021-10-22 LAB — TROPONIN I (HIGH SENSITIVITY)
Troponin I (High Sensitivity): 4 ng/L (ref <18)
Troponin I (High Sensitivity): 2 ng/L (ref <18)

## 2021-10-22 LAB — LIPASE, BLOOD: Lipase: 58 U/L — ABNORMAL HIGH (ref 11–51)

## 2021-10-22 LAB — PREGNANCY, URINE: Preg Test, Ur: NEGATIVE

## 2021-10-22 LAB — AMMONIA: Ammonia: 38 umol/L — ABNORMAL HIGH (ref 9–35)

## 2021-10-22 LAB — RESP PANEL BY RT-PCR (FLU A&B, COVID) ARPGX2
Influenza A by PCR: NEGATIVE
Influenza B by PCR: NEGATIVE
SARS Coronavirus 2 by RT PCR: NEGATIVE

## 2021-10-22 LAB — LACTIC ACID, PLASMA: Lactic Acid, Venous: 0.8 mmol/L (ref 0.5–1.9)

## 2021-10-22 LAB — BRAIN NATRIURETIC PEPTIDE: B Natriuretic Peptide: 28.1 pg/mL (ref 0.0–100.0)

## 2021-10-22 MED ORDER — PROMETHAZINE HCL 25 MG PO TABS: 25.0000 mg | ORAL_TABLET | Freq: Four times a day (QID) | ORAL | 0 refills | Status: AC | PRN

## 2021-10-22 MED ORDER — SODIUM CHLORIDE 0.9 % IV SOLN
25.0000 mg | Freq: Four times a day (QID) | INTRAVENOUS | Status: DC | PRN
  Administered 2021-10-22: 25 mg via INTRAVENOUS
  Filled 2021-10-22: qty 1

## 2021-10-22 MED ORDER — OXYCODONE-ACETAMINOPHEN 5-325 MG PO TABS: 1.0000 | ORAL_TABLET | ORAL | 0 refills | Status: AC | PRN

## 2021-10-22 MED ORDER — IOHEXOL 300 MG/ML  SOLN
100.0000 mL | Freq: Once | INTRAMUSCULAR | Status: AC | PRN
  Administered 2021-10-22: 100 mL via INTRAVENOUS

## 2021-10-22 MED ORDER — HYDROMORPHONE HCL 1 MG/ML IJ SOLN
1.0000 mg | Freq: Once | INTRAMUSCULAR | Status: AC
Start: 2021-10-22 — End: 2021-10-22
  Administered 2021-10-22: 1 mg via INTRAVENOUS
  Filled 2021-10-22: qty 1

## 2021-10-22 MED ORDER — HYDROMORPHONE HCL 1 MG/ML IJ SOLN
1.0000 mg | Freq: Once | INTRAMUSCULAR | Status: AC
  Administered 2021-10-22: 1 mg via INTRAVENOUS
  Filled 2021-10-22: qty 1

## 2021-10-22 MED ORDER — CEFPODOXIME PROXETIL 200 MG PO TABS
200.0000 mg | ORAL_TABLET | Freq: Two times a day (BID) | ORAL | 0 refills | Status: AC
Start: 2021-10-22 — End: 2021-11-05

## 2021-10-22 NOTE — Discharge Instructions (Signed)
Your history, exam, work-up today led [place] to suspect that a left-sided pyelonephritis or urinary tract infection going to your kidney is causing your left-sided symptoms and abdominal pain.  Please use the pain medicine, nausea medicine, and antibiotics to treat.  The CT imaging did not show evidence of other significant abnormalities and we feel it is appropriate have you follow-up with your outpatient PCP for your biliary duct dilation as we discussed.  Please rest and stay hydrated.  If any symptoms change or worsen acutely, please return to the nearest emergency department.

## 2021-10-22 NOTE — ED Provider Notes (Signed)
[facility] EMERGENCY DEPT Provider Note   CSN: [id number] Arrival date & time: 10/22/21  2542     History  Chief Complaint  Patient presents with   Flank Pain    Teresa Valenzuela is a 51 y.o. female.  The history is provided by the patient and medical records. No language interpreter was used.  Abdominal Pain Pain location:  Epigastric, LUQ and RUQ Pain quality: aching, burning, cramping and sharp   Pain radiates to:  Does not radiate Pain severity:  Severe Onset quality:  Gradual Timing:  Constant Progression:  Unchanged Chronicity:  New Context: not recent illness and not trauma   Relieved by:  Nothing Worsened by:  Nothing Ineffective treatments:  None tried Associated symptoms: chest pain, nausea and shortness of breath   Associated symptoms: no chills, no constipation, no cough, no diarrhea, no dysuria, no fatigue, no fever and no vomiting       Home Medications Prior to Admission medications   Medication Sig Start Date End Date Taking? Authorizing Provider  acetaminophen (TYLENOL) 325 MG tablet Take 2 tablets (650 mg total) by mouth every 4 (four) hours as needed for headache or mild pain. 01/12/20   [name] T, PA-C  amLODipine (NORVASC) 5 MG tablet Take 5 mg by mouth daily.  08/05/19   [provider]  aspirin 81 MG tablet Take 81 mg by mouth daily.    [provider]  furosemide (LASIX) 40 MG tablet Take 1 tablet (40 mg total) by mouth 2 (two) times daily. 01/12/20 01/11/21  [name] T, PA-C  methadone (DOLOPHINE) 10 MG/ML solution Take 65 mg by mouth daily.    [provider]  nitroGLYCERIN (NITROSTAT) 0.4 MG SL tablet Place 1 tablet (0.4 mg total) under the tongue every 5 (five) minutes as needed for chest pain. 01/12/20 01/11/21  [name] T, PA-C  promethazine (PHENERGAN) 25 MG tablet Take 25 mg by mouth every 6 (six) hours as needed for nausea or vomiting.  03/10/15   [provider]  spironolactone  (ALDACTONE) 25 MG tablet Take 1 tablet (25 mg total) by mouth daily. 01/13/20 01/12/21  [name] T, PA-C      Allergies    Zofran, Metoclopramide, and Ondansetron    Review of Systems   Review of Systems  Constitutional:  Negative for chills, fatigue and fever.  HENT:  Negative for congestion.   Respiratory:  Positive for shortness of breath. Negative for cough, chest tightness and wheezing.   Cardiovascular:  Positive for chest pain. Negative for palpitations and leg swelling.  Gastrointestinal:  Positive for abdominal pain and nausea. Negative for abdominal distention, blood in stool, constipation, diarrhea and vomiting.  Genitourinary:  Positive for flank pain. Negative for dysuria.  Musculoskeletal:  Negative for back pain.  Neurological:  Negative for headaches.  Psychiatric/Behavioral:  Negative for agitation and confusion.   All other systems reviewed and are negative.  Physical Exam Updated Vital Signs BP (!) 156/75    Pulse 77    Temp 98.3 F (36.8 C) (Oral)    Resp 13    Ht 5\' 5"  (1.651 m)    Wt 127 kg    LMP 11/18/2020 (Approximate)    SpO2 99%    BMI 46.59 kg/m  Physical Exam Vitals and nursing note reviewed.  Constitutional:      General: She is not in acute distress.    Appearance: She is well-developed. She is not ill-appearing, toxic-appearing or diaphoretic.  HENT:  Head: Normocephalic and atraumatic.     Mouth/Throat:     Mouth: Mucous membranes are moist.     Pharynx: No oropharyngeal exudate or posterior oropharyngeal erythema.  Eyes:     Extraocular Movements: Extraocular movements intact.     Conjunctiva/sclera: Conjunctivae normal.     Pupils: Pupils are equal, round, and reactive to light.  Cardiovascular:     Rate and Rhythm: Normal rate and regular rhythm.     Pulses: Normal pulses.     Heart sounds: No murmur heard. Pulmonary:     Effort: Pulmonary effort is normal. No respiratory distress.     Breath sounds: Normal breath sounds.   Abdominal:     Palpations: Abdomen is soft.     Tenderness: There is abdominal tenderness. There is left CVA tenderness. There is no right CVA tenderness, guarding or rebound.  Musculoskeletal:        General: Tenderness present. No swelling.     Cervical back: Neck supple. No tenderness.     Right lower leg: No edema.     Left lower leg: No edema.  Skin:    General: Skin is warm and dry.     Capillary Refill: Capillary refill takes less than 2 seconds.     Findings: No erythema.  Neurological:     General: No focal deficit present.     Mental Status: She is alert.     Sensory: No sensory deficit.     Motor: No weakness.  Psychiatric:        Mood and Affect: Mood normal.    ED Results / Procedures / Treatments   Labs (all labs ordered are listed, but only abnormal results are displayed) Labs Reviewed  LIPASE, BLOOD - Abnormal; Notable for the following components:      Result Value   Lipase 58 (*)    All other components within normal limits  COMPREHENSIVE METABOLIC PANEL - Abnormal; Notable for the following components:   Sodium 134 (*)    All other components within normal limits  CBC - Abnormal; Notable for the following components:   WBC 12.2 (*)    All other components within normal limits  URINALYSIS, ROUTINE W REFLEX MICROSCOPIC - Abnormal; Notable for the following components:   Hgb urine dipstick SMALL (*)    Protein, ur TRACE (*)    Leukocytes,Ua LARGE (*)    Bacteria, UA RARE (*)    All other components within normal limits  AMMONIA - Abnormal; Notable for the following components:   Ammonia 38 (*)    All other components within normal limits  RESP PANEL BY RT-PCR (FLU A&B, COVID) ARPGX2  URINE CULTURE  PREGNANCY, URINE  BRAIN NATRIURETIC PEPTIDE  LACTIC ACID, PLASMA  PROTIME-INR  LACTIC ACID, PLASMA  TROPONIN I (HIGH SENSITIVITY)  TROPONIN I (HIGH SENSITIVITY)    EKG EKG Interpretation  Date/Time:  Friday October 22 2021 09:37:02  EST Ventricular Rate:  81 PR Interval:  146 QRS Duration: 114 QT Interval:  429 QTC Calculation: 498 R Axis:   54 Text Interpretation: Sinus rhythm Borderline intraventricular conduction delay Borderline prolonged QT interval When compared to prior, similar appearance. No STEMI Confirmed by Antony Blackbird [phone]) on 10/22/2021 9:56:29 AM  Radiology DG Chest 2 View  Result Date: 10/22/2021 CLINICAL DATA:  Chest pain, shortness of breath EXAM: CHEST - 2 VIEW COMPARISON:  01/09/2020 FINDINGS: The heart size and mediastinal contours are within normal limits. Left costophrenic angle was excluded from the field of view on  frontal image. Visualized lung fields are clear. No focal airspace consolidation, pleural effusion, or pneumothorax. The visualized skeletal structures are unremarkable. IMPRESSION: No active cardiopulmonary disease. Electronically Signed   By: Davina Poke D.O.   On: 10/22/2021 10:25   CT ABDOMEN PELVIS W CONTRAST  Result Date: 10/22/2021 CLINICAL DATA:  Abdominal pain EXAM: CT ABDOMEN AND PELVIS WITH CONTRAST TECHNIQUE: Multidetector CT imaging of the abdomen and pelvis was performed using the standard protocol following bolus administration of intravenous contrast. RADIATION DOSE REDUCTION: This exam was performed according to the departmental dose-optimization program which includes automated exposure control, adjustment of the mA and/or kV according to patient size and/or use of iterative reconstruction technique. CONTRAST:  177mL OMNIPAQUE IOHEXOL 300 MG/ML  SOLN COMPARISON:  CT abdomen and pelvis dated May 10, 2020 FINDINGS: Lower chest: No acute abnormality. Hepatobiliary: No suspicious liver lesions. Prior cholecystectomy. Moderate intrahepatic biliary ductal dilation. Common bile duct dilation, measuring up to 1.3 cm. Pancreas: Fat containing lesion of the pancreatic body seen on series 2, image 35, compatible with a pancreatic lipoma. No pancreatic cysts. No  peripancreatic inflammation. Spleen: Calcifications of the spleen, likely sequela of prior granulomatous infection. Adrenals/Urinary Tract: Bilateral adrenal glands are unremarkable. No hydronephrosis or nephrolithiasis. Bladder is unremarkable. Stomach/Bowel: Stomach is within normal limits. Appendix appears normal. No evidence of bowel wall thickening, distention, or inflammatory changes. Vascular/Lymphatic: No enlarged lymph nodes seen in the abdomen or pelvis. No vascular abnormalities. Reproductive: Endometrial thickening. Fibroid of the anterior body of the uterus. Bilateral cystic adnexal lesions measuring up to 1.8 cm on the right and 2.5 cm on the left, no further follow-up is needed. Other: No abdominal wall hernia or abnormality. No abdominopelvic ascites. Musculoskeletal: No acute or significant osseous findings. IMPRESSION: 1. Dilated common bile ducts and intrahepatic bile ducts, greater than expected post cholecystectomy and increased when compared with prior exam. Correlate with LFTs. If LFTs are abnormal recommend MRCP for further evaluation. 2. No peripancreatic inflammation to suggest acute pancreatitis. 3. Endometrial thickening, recommend further evaluation with pelvic ultrasound if patient is postmenopausal. Electronically Signed   By: Yetta Glassman M.D.   On: 10/22/2021 10:28    Procedures Procedures    Medications Ordered in ED Medications  promethazine (PHENERGAN) 25 mg in sodium chloride 0.9 % 50 mL IVPB (0 mg Intravenous Stopped 10/22/21 1408)  HYDROmorphone (DILAUDID) injection 1 mg (1 mg Intravenous Given 10/22/21 0913)  iohexol (OMNIPAQUE) 300 MG/ML solution 100 mL (100 mLs Intravenous Contrast Given 10/22/21 1002)  HYDROmorphone (DILAUDID) injection 1 mg (1 mg Intravenous Given 10/22/21 1425)    ED Course/ Medical Decision Making/ A&P                           Medical Decision Making Amount and/or Complexity of Data Reviewed Labs: ordered. Radiology:  ordered.  Risk Prescription drug management.    Teresa Valenzuela is a 51 y.o. female with a past medical history significant for prior CHF, previous pancreatitis with pancreatic cyst, hypertension, hyperlipidemia, previous cholecystectomy, and previous tubal ligation who presents for severe left abdominal pain, left flank pain, some chest pain, shortness of breath.  According to patient, she has been feeling bad for the last few days and has been acutely worsening overnight.  She reports pain in her left upper quadrant and upper abdomen that goes around towards her left back and flank.  She says that she is also having some edema and swelling in her face and flushing  and some itching throughout which she feels is similar to how she did when she had pancreatitis and heart failure before.  She reports some chest pain and shortness of breath over the last week or so intermittently but denies chest pain currently.  She reports some nausea but no vomiting.  She reports no significant constipation or diarrhea or urinary changes.  She reports the pain is moderate to severe.  She has not had any acute trauma.  On exam, lungs clear.  Chest was nontender.  Abdomen was very tender across her upper abdomen as well as her left flank and left back.  No rash to suggest shingles.  Intact sensation, strength, and pulses in extremities.  Patient did have some flushing and redness in her face which she reports is new.  She also was itching but did not have any evidence of excoriations or rashes on extremities.  No murmur appreciated.  Patient resting but in clear discomfort in her abdomen.  Had a shared decision made conversation with patient about work-up.  Patient is amenable to getting work-up to rule out concerning etiologies symptoms are given history of pancreatitis with cysts in the location of discomfort, we will get CT imaging to look for abscess or severe pancreatitis.  This will also look for other etiologies  such as kidney stone or other bowel troubles such as diverticulitis or colitis.  We will also get chest x-ray and labs and work-up for new heart failure exacerbation or cardiac pain.  She reports her discomfort is nonpleuritic and she is not tachycardic, tachypneic, or hypoxic.  Low suspicion for pulmonary embolism at this time.  Anticipate reassessment after work-up to determine disposition.   Patient's work-up showed evidence of possible urinary tract infection.  Given her history of pyelonephritis and urinary tract infections and the left-sided symptoms, I do suspect she has an early pyelonephritis.  CT scan did not show evidence of kidney stone obstructing, pancreatitis, and did show some bile duct dilation however LFTs were normal on reassessment.  Patient is feeling better on reassessment.  Patient agrees with plan for antibiotics for the pyelonephritis as well as pain and nausea medicine.  She will follow-up with her PCP and understood extremities return precautions.  Other work-up reassuring.  Opponent negative x2 and BNP not critically elevated.  Patient otherwise well-appearing now.  She had other questions or concerns and was discharged in good condition.,          Final Clinical Impression(s) / ED Diagnoses Final diagnoses:  Pyelonephritis  Left flank pain  Generalized abdominal pain  Nausea and vomiting, unspecified vomiting type    Rx / DC Orders ED Discharge Orders          Ordered    cefpodoxime (VANTIN) 200 MG tablet  2 times daily        10/22/21 1523    oxyCODONE-acetaminophen (PERCOCET/ROXICET) 5-325 MG tablet  Every 4 hours PRN        10/22/21 1523    promethazine (PHENERGAN) 25 MG tablet  Every 6 hours PRN        10/22/21 1523           Clinical Impression: 1. Pyelonephritis   2. Left flank pain   3. Generalized abdominal pain   4. Nausea and vomiting, unspecified vomiting type     Disposition: Discharge  Condition: Good  I have discussed  the results, Dx and Tx plan with the pt(& family if present). He/she/they expressed understanding and agree(s) with the  plan. Discharge instructions discussed at great length. Strict return precautions discussed and pt &/or family have verbalized understanding of the instructions. No further questions at time of discharge.    Discharge Medication List as of 10/22/2021  3:24 PM     START taking these medications   Details  cefpodoxime (VANTIN) 200 MG tablet Take 1 tablet (200 mg total) by mouth 2 (two) times daily for 14 days., Starting Fri 10/22/2021, Until Fri 11/05/2021, Normal    oxyCODONE-acetaminophen (PERCOCET/ROXICET) 5-325 MG tablet Take 1 tablet by mouth every 4 (four) hours as needed for severe pain., Starting Fri 10/22/2021, Normal    !! promethazine (PHENERGAN) 25 MG tablet Take 1 tablet (25 mg total) by mouth every 6 (six) hours as needed for nausea or vomiting., Starting Fri 10/22/2021, Normal     !! - Potential duplicate medications found. Please discuss with provider.      Follow Up: Carolee Rota, NP [place] [address] [facility] [phone]        Zoelle Markus, Gwenyth Allegra, MD 10/22/21 [phone]

## 2021-10-22 NOTE — ED Notes (Signed)
Patient transported to CT 

## 2021-10-22 NOTE — ED Triage Notes (Signed)
Patient reports to the ER for left sided flank pain that goes to her abdomen. Patient reports she has had a cyst on her pancreas previously. Patient reports she feels swollen and flushed

## 2021-10-23 LAB — URINE CULTURE: Culture: <10000 — AB

## 2022-03-11 ENCOUNTER — Other Ambulatory Visit: Payer: Self-pay | Admitting: Gastroenterology

## 2022-03-11 DIAGNOSIS — R109 Unspecified abdominal pain: Secondary | ICD-10-CM

## 2022-03-11 DIAGNOSIS — K838 Other specified diseases of biliary tract: Secondary | ICD-10-CM

## 2022-03-26 ENCOUNTER — Inpatient Hospital Stay: Admission: RE | Admit: 2022-03-26 | Payer: 59 | Source: Ambulatory Visit

## 2023-02-07 ENCOUNTER — Other Ambulatory Visit: Payer: Self-pay

## 2023-02-07 ENCOUNTER — Emergency Department (HOSPITAL_BASED_OUTPATIENT_CLINIC_OR_DEPARTMENT_OTHER)
Admission: EM | Admit: 2023-02-07 | Discharge: 2023-02-07 | Disposition: A | Discharge status: Home / Self Care | Payer: 59 | Attending: Emergency Medicine | Admitting: Emergency Medicine

## 2023-02-07 ENCOUNTER — Encounter (HOSPITAL_BASED_OUTPATIENT_CLINIC_OR_DEPARTMENT_OTHER): Payer: Self-pay | Admitting: Emergency Medicine

## 2023-02-07 DIAGNOSIS — H6690 Otitis media, unspecified, unspecified ear: Secondary | ICD-10-CM

## 2023-02-07 DIAGNOSIS — R42 Dizziness and giddiness: Secondary | ICD-10-CM | POA: Diagnosis not present

## 2023-02-07 DIAGNOSIS — H6692 Otitis media, unspecified, left ear: Secondary | ICD-10-CM | POA: Diagnosis not present

## 2023-02-07 DIAGNOSIS — H9202 Otalgia, left ear: Secondary | ICD-10-CM | POA: Diagnosis present

## 2023-02-07 LAB — BASIC METABOLIC PANEL
Anion gap: 7 (ref 5–15)
BUN: 17 mg/dL (ref 6–20)
CO2: 26 mmol/L (ref 22–32)
Calcium: 8.5 mg/dL — ABNORMAL LOW (ref 8.9–10.3)
Chloride: 100 mmol/L (ref 98–111)
Creatinine, Ser: 0.73 mg/dL (ref 0.44–1.00)
GFR, Estimated: >60 mL/min (ref >60)
Glucose, Bld: 129 mg/dL — ABNORMAL HIGH (ref 70–99)
Potassium: 3.7 mmol/L (ref 3.5–5.1)
Sodium: 133 mmol/L — ABNORMAL LOW (ref 135–145)

## 2023-02-07 LAB — CBC
HCT: 35.9 % — ABNORMAL LOW (ref 36.0–46.0)
Hemoglobin: 12 g/dL (ref 12.0–15.0)
MCH: 30.5 pg (ref 26.0–34.0)
MCHC: 33.4 g/dL (ref 30.0–36.0)
MCV: 91.1 fL (ref 80.0–100.0)
Platelets: 186 10*3/uL (ref 150–400)
RBC: 3.94 MIL/uL (ref 3.87–5.11)
RDW: 12.4 % (ref 11.5–15.5)
WBC: 8.3 10*3/uL (ref 4.0–10.5)
nRBC: 0 % (ref 0.0–0.2)

## 2023-02-07 MED ORDER — AMOXICILLIN 500 MG PO CAPS: 500.0000 mg | ORAL_CAPSULE | Freq: Three times a day (TID) | ORAL | 0 refills | Status: AC

## 2023-02-07 NOTE — ED Provider Notes (Signed)
[facility] Provider Note   CSN: [id number] Arrival date & time: 02/07/23  9562     History  Chief Complaint  Patient presents with   Otalgia   Dizziness    Teresa Valenzuela is a 52 y.o. female.  The history is provided by the patient. No language interpreter was used.  Otalgia Location:  Left Behind ear:  No abnormality Quality:  Aching Severity:  Moderate Onset quality:  Gradual Duration:  1 day Timing:  Constant Progression:  Worsening Chronicity:  New Relieved by:  Nothing Ineffective treatments:  None tried Associated symptoms: no sore throat   Dizziness Pt reports she feels dizzy today as well      Home Medications Prior to Admission medications   Medication Sig Start Date End Date Taking? Authorizing Provider  amoxicillin (AMOXIL) 500 MG capsule Take 1 capsule (500 mg total) by mouth 3 (three) times daily. 02/07/23  Yes Cheron Schaumann K, PA-C  hydrOXYzine (ATARAX) 25 MG tablet Take 25 mg by mouth 4 (four) times daily. 01/21/23  Yes [provider]  valsartan (DIOVAN) 160 MG tablet Take 160 mg by mouth daily. 12/22/22  Yes [provider]  acetaminophen (TYLENOL) 325 MG tablet Take 2 tablets (650 mg total) by mouth every 4 (four) hours as needed for headache or mild pain. 01/12/20   [name] T, PA-C  aspirin 81 MG tablet Take 81 mg by mouth daily.    [provider]  methadone (DOLOPHINE) 10 MG/ML solution Take 65 mg by mouth daily.    [provider]  oxyCODONE-acetaminophen (PERCOCET/ROXICET) 5-325 MG tablet Take 1 tablet by mouth every 4 (four) hours as needed for severe pain. 10/22/21   Tegeler, Canary Brim, MD  promethazine (PHENERGAN) 25 MG tablet Take 1 tablet (25 mg total) by mouth every 6 (six) hours as needed for nausea or vomiting. 10/22/21   Tegeler, Canary Brim, MD  spironolactone (ALDACTONE) 25 MG tablet Take 1 tablet (25 mg total) by mouth daily. 01/13/20 01/12/21   [name] T, PA-C      Allergies    Zofran, Metoclopramide, and Ondansetron    Review of Systems   Review of Systems  HENT:  Positive for ear pain. Negative for sore throat.   Neurological:  Positive for dizziness.  All other systems reviewed and are negative.   Physical Exam Updated Vital Signs BP 127/88 (BP Location: Left Arm)   Pulse 82   Temp 97.8 F (36.6 C) (Oral)   Resp 20   Ht 5\' 6"  (1.676 m)   Wt 127 kg   SpO2 98%   BMI 45.19 kg/m  Physical Exam Vitals and nursing note reviewed.  Constitutional:      Appearance: She is well-developed.  HENT:     Head: Normocephalic.     Right Ear: Tympanic membrane normal.     Ears:     Comments: Dull, poor landmarks,  pain with exam  Cardiovascular:     Rate and Rhythm: Normal rate.  Pulmonary:     Effort: Pulmonary effort is normal.  Abdominal:     General: There is no distension.  Musculoskeletal:        General: Normal range of motion.     Cervical back: Normal range of motion.  Skin:    General: Skin is warm.  Neurological:     General: No focal deficit present.     Mental Status: She is alert and oriented to person, place, and  time.  Psychiatric:        Mood and Affect: Mood normal.     ED Results / Procedures / Treatments   Labs (all labs ordered are listed, but only abnormal results are displayed) Labs Reviewed  BASIC METABOLIC PANEL - Abnormal; Notable for the following components:      Result Value   Sodium 133 (*)    Glucose, Bld 129 (*)    Calcium 8.5 (*)    All other components within normal limits  CBC - Abnormal; Notable for the following components:   HCT 35.9 (*)    All other components within normal limits  URINALYSIS, ROUTINE W REFLEX MICROSCOPIC  PREGNANCY, URINE  CBG MONITORING, ED    EKG None  Radiology No results found.  Procedures Procedures    Medications Ordered in ED Medications - No data to display  ED Course/ Medical Decision Making/ A&P                              Medical Decision Making Pt complains of feeling lightheade and left ear pain   Risk Prescription drug management. Risk Details: Pt has tender tragus, poor landmarks.  I will treat pt with Amoxicillian            Final Clinical Impression(s) / ED Diagnoses Final diagnoses:  Acute otitis media, unspecified otitis media type    Rx / DC Orders ED Discharge Orders          Ordered    amoxicillin (AMOXIL) 500 MG capsule  3 times daily        02/07/23 1004          An After Visit Summary was printed and given to the patient.     Elson Areas, [place] 02/07/23 1016    Arby Barrette, MD 02/07/23 [phone]

## 2023-02-07 NOTE — ED Triage Notes (Signed)
Left ear pain for one week.  Started to have dizziness yesterday, seems worse today.  No known fevers.

## 2023-02-07 NOTE — Discharge Instructions (Addendum)
Return if any problems.  See your Physician for recheck  °

## 2024-02-09 ENCOUNTER — Other Ambulatory Visit: Payer: Self-pay | Admitting: Physician Assistant

## 2024-02-09 DIAGNOSIS — Z1231 Encounter for screening mammogram for malignant neoplasm of breast: Secondary | ICD-10-CM

## 2024-03-06 ENCOUNTER — Emergency Department (HOSPITAL_BASED_OUTPATIENT_CLINIC_OR_DEPARTMENT_OTHER)

## 2024-03-06 ENCOUNTER — Emergency Department (HOSPITAL_BASED_OUTPATIENT_CLINIC_OR_DEPARTMENT_OTHER)
Admission: EM | Admit: 2024-03-06 | Discharge: 2024-03-06 | Disposition: A | Discharge status: Home / Self Care | Attending: Emergency Medicine | Admitting: Emergency Medicine

## 2024-03-06 ENCOUNTER — Encounter (HOSPITAL_BASED_OUTPATIENT_CLINIC_OR_DEPARTMENT_OTHER): Payer: Self-pay

## 2024-03-06 ENCOUNTER — Other Ambulatory Visit: Payer: Self-pay

## 2024-03-06 ENCOUNTER — Emergency Department (HOSPITAL_COMMUNITY)

## 2024-03-06 DIAGNOSIS — Z7982 Long term (current) use of aspirin: Secondary | ICD-10-CM | POA: Diagnosis not present

## 2024-03-06 DIAGNOSIS — I110 Hypertensive heart disease with heart failure: Secondary | ICD-10-CM | POA: Diagnosis not present

## 2024-03-06 DIAGNOSIS — R42 Dizziness and giddiness: Secondary | ICD-10-CM | POA: Diagnosis present

## 2024-03-06 DIAGNOSIS — Z79899 Other long term (current) drug therapy: Secondary | ICD-10-CM | POA: Diagnosis not present

## 2024-03-06 DIAGNOSIS — I509 Heart failure, unspecified: Secondary | ICD-10-CM | POA: Diagnosis not present

## 2024-03-06 DIAGNOSIS — R200 Anesthesia of skin: Secondary | ICD-10-CM | POA: Insufficient documentation

## 2024-03-06 LAB — URINALYSIS, W/ REFLEX TO CULTURE (INFECTION SUSPECTED)
Bacteria, UA: NONE SEEN
Bilirubin Urine: NEGATIVE
Glucose, UA: NEGATIVE mg/dL
Hgb urine dipstick: NEGATIVE
Ketones, ur: NEGATIVE mg/dL
Nitrite: NEGATIVE
Protein, ur: NEGATIVE mg/dL
Specific Gravity, Urine: 1.018 (ref 1.005–1.030)
pH: 5.5 (ref 5.0–8.0)

## 2024-03-06 LAB — COMPREHENSIVE METABOLIC PANEL WITH GFR
ALT: 48 U/L — ABNORMAL HIGH (ref 0–44)
AST: 44 U/L — ABNORMAL HIGH (ref 15–41)
Albumin: 4 g/dL (ref 3.5–5.0)
Alkaline Phosphatase: 94 U/L (ref 38–126)
Anion gap: 8 (ref 5–15)
BUN: 16 mg/dL (ref 6–20)
CO2: 27 mmol/L (ref 22–32)
Calcium: 9.4 mg/dL (ref 8.9–10.3)
Chloride: 103 mmol/L (ref 98–111)
Creatinine, Ser: 0.82 mg/dL (ref 0.44–1.00)
GFR, Estimated: >60 mL/min (ref >60)
Glucose, Bld: 101 mg/dL — ABNORMAL HIGH (ref 70–99)
Potassium: 4.4 mmol/L (ref 3.5–5.1)
Sodium: 138 mmol/L (ref 135–145)
Total Bilirubin: 0.4 mg/dL (ref 0.0–1.2)
Total Protein: 7.6 g/dL (ref 6.5–8.1)

## 2024-03-06 LAB — HCG, QUANTITATIVE, PREGNANCY: hCG, Beta Chain, Quant, S: <1 m[IU]/mL (ref <5)

## 2024-03-06 LAB — CBC WITH DIFFERENTIAL/PLATELET
Abs Immature Granulocytes: 0.04 10*3/uL (ref 0.00–0.07)
Basophils Absolute: 0.1 10*3/uL (ref 0.0–0.1)
Basophils Relative: 1 %
Eosinophils Absolute: 0.2 10*3/uL (ref 0.0–0.5)
Eosinophils Relative: 3 %
HCT: 36.4 % (ref 36.0–46.0)
Hemoglobin: 12 g/dL (ref 12.0–15.0)
Immature Granulocytes: 1 %
Lymphocytes Relative: 31 %
Lymphs Abs: 2 10*3/uL (ref 0.7–4.0)
MCH: 30 pg (ref 26.0–34.0)
MCHC: 33 g/dL (ref 30.0–36.0)
MCV: 91 fL (ref 80.0–100.0)
Monocytes Absolute: 0.6 10*3/uL (ref 0.1–1.0)
Monocytes Relative: 9 %
Neutro Abs: 3.7 10*3/uL (ref 1.7–7.7)
Neutrophils Relative %: 55 %
Platelets: 221 10*3/uL (ref 150–400)
RBC: 4 MIL/uL (ref 3.87–5.11)
RDW: 12.3 % (ref 11.5–15.5)
WBC: 6.5 10*3/uL (ref 4.0–10.5)
nRBC: 0 % (ref 0.0–0.2)

## 2024-03-06 LAB — TSH: TSH: 0.723 u[IU]/mL (ref 0.350–4.500)

## 2024-03-06 LAB — CK: Total CK: 150 U/L (ref 38–234)

## 2024-03-06 LAB — LIPASE, BLOOD: Lipase: 61 U/L — ABNORMAL HIGH (ref 11–51)

## 2024-03-06 LAB — TROPONIN T, HIGH SENSITIVITY: Troponin T High Sensitivity: <15 ng/L (ref <19)

## 2024-03-06 MED ORDER — MECLIZINE HCL 25 MG PO TABS: 25.0000 mg | ORAL_TABLET | Freq: Three times a day (TID) | ORAL | 0 refills | Status: DC | PRN

## 2024-03-06 MED ORDER — SODIUM CHLORIDE 0.9 % IV BOLUS
1000.0000 mL | Freq: Once | INTRAVENOUS | Status: AC
  Administered 2024-03-06: 1000 mL via INTRAVENOUS

## 2024-03-06 MED ORDER — LORAZEPAM 2 MG/ML IJ SOLN
1.0000 mg | Freq: Once | INTRAMUSCULAR | Status: AC | PRN
  Administered 2024-03-06: 1 mg via INTRAVENOUS
  Filled 2024-03-06: qty 1

## 2024-03-06 MED ORDER — MECLIZINE HCL 25 MG PO TABS
50.0000 mg | ORAL_TABLET | Freq: Once | ORAL | Status: AC
  Administered 2024-03-06: 50 mg via ORAL
  Filled 2024-03-06: qty 2

## 2024-03-06 MED ORDER — IOHEXOL 350 MG/ML SOLN
100.0000 mL | Freq: Once | INTRAVENOUS | Status: AC | PRN
  Administered 2024-03-06: 80 mL via INTRAVENOUS

## 2024-03-06 MED ORDER — GADOBUTROL 1 MMOL/ML IV SOLN
10.0000 mL | Freq: Once | INTRAVENOUS | Status: AC | PRN
  Administered 2024-03-06: 10 mL via INTRAVENOUS

## 2024-03-06 NOTE — ED Triage Notes (Signed)
 In for eval of dizziness spells onset 2 weeks ago. Dizziness returned yesterday and worsened today. Denies blurred vision. No slurred speech, facial droop, numbness or tingling to extremities. Motor intact x4 extremities. Reports slight nausea.

## 2024-03-06 NOTE — ED Notes (Signed)
 [facility] at bedside

## 2024-03-06 NOTE — ED Notes (Signed)
 Called Teresa Valenzuela at [facility] for transport

## 2024-03-06 NOTE — ED Provider Notes (Signed)
 [place] EMERGENCY DEPARTMENT AT [facility] Provider Note   CSN: [id number] Arrival date & time: 03/06/24  9096     Patient presents with: Dizziness   Teresa Valenzuela is a 53 y.o. female.   The history is provided by the patient, medical records and a relative. No language interpreter was used.  Dizziness Quality:  Lightheadedness, room spinning and head spinning Severity:  Severe Duration:  2 weeks Timing:  Intermittent (but now constant for last few days) Chronicity:  New Context: bending over, eye movement and head movement   Relieved by:  Nothing Worsened by:  Movement Ineffective treatments:  None tried Associated symptoms: no chest pain, no diarrhea, no headaches, no nausea, no palpitations, no shortness of breath, no tinnitus, no vision changes, no vomiting and no weakness   Risk factors: no hx of stroke and no hx of vertigo        Prior to Admission medications   Medication Sig Start Date End Date Taking? Authorizing Provider  acetaminophen  (TYLENOL ) 325 MG tablet Take 2 tablets (650 mg total) by mouth every 4 (four) hours as needed for headache or mild pain. 01/12/20   Lelon Hamilton T, PA-C  amoxicillin  (AMOXIL ) 500 MG capsule Take 1 capsule (500 mg total) by mouth 3 (three) times daily. 02/07/23   Sofia, Leslie K, PA-C  aspirin  81 MG tablet Take 81 mg by mouth daily.    [provider]  hydrOXYzine (ATARAX) 25 MG tablet Take 25 mg by mouth 4 (four) times daily. 01/21/23   [provider]  methadone  (DOLOPHINE ) 10 MG/ML solution Take 65 mg by mouth daily.    [provider]  oxyCODONE -acetaminophen  (PERCOCET/ROXICET) 5-325 MG tablet Take 1 tablet by mouth every 4 (four) hours as needed for severe pain. 10/22/21   Poetry Cerro, Teresa PARAS, MD  promethazine  (PHENERGAN ) 25 MG tablet Take 1 tablet (25 mg total) by mouth every 6 (six) hours as needed for nausea or vomiting. 10/22/21   Jasmaine Rochel, Teresa PARAS, MD  spironolactone  (ALDACTONE )  25 MG tablet Take 1 tablet (25 mg total) by mouth daily. 01/13/20 01/12/21  Lelon Hamilton T, PA-C  valsartan (DIOVAN) 160 MG tablet Take 160 mg by mouth daily. 12/22/22   [provider]    Allergies: Zofran , Metoclopramide , and Ondansetron     Review of Systems  Constitutional:  Negative for chills, fatigue and fever.  HENT:  Negative for congestion and tinnitus.   Eyes:  Negative for visual disturbance (briefly reported some darkened vision when first near syncopal 2 weeks ago).  Respiratory:  Negative for cough, chest tightness, shortness of breath and wheezing.   Cardiovascular:  Negative for chest pain and palpitations.  Gastrointestinal:  Positive for abdominal pain (luq feels like prior pancreatitis). Negative for constipation, diarrhea, nausea and vomiting.  Genitourinary:  Negative for dysuria and frequency.  Musculoskeletal:  Negative for back pain, neck pain and neck stiffness.  Skin:  Negative for rash and wound.  Neurological:  Positive for dizziness and numbness. Negative for syncope, speech difficulty, weakness, light-headedness and headaches.  Psychiatric/Behavioral:  Negative for agitation and confusion.     Updated Vital Signs Temp 97.8 F (36.6 C) (Oral)   Ht 5' 7 (1.702 m)   Wt 129.7 kg   BMI 44.79 kg/m   Physical Exam Vitals and nursing note reviewed.  Constitutional:      General: She is not in acute distress.    Appearance: She is well-developed. She is not ill-appearing, toxic-appearing or diaphoretic.  HENT:  Head: Atraumatic.     Right Ear: External ear normal.     Left Ear: External ear normal.     Nose: Nose normal. No congestion or rhinorrhea.     Mouth/Throat:     Mouth: Mucous membranes are dry.     Pharynx: No oropharyngeal exudate or posterior oropharyngeal erythema.   Eyes:     Extraocular Movements: Extraocular movements intact.     Conjunctiva/sclera: Conjunctivae normal.     Pupils: Pupils are equal, round, and reactive to  light.   Neck:     Vascular: No carotid bruit.   Cardiovascular:     Pulses: Normal pulses.     Heart sounds: No murmur heard. Pulmonary:     Effort: No respiratory distress.     Breath sounds: No stridor. No wheezing, rhonchi or rales.  Chest:     Chest wall: No tenderness.  Abdominal:     General: There is no distension.     Tenderness: There is no abdominal tenderness. There is no right CVA tenderness, left CVA tenderness, guarding or rebound.   Musculoskeletal:        General: No tenderness.     Cervical back: Normal range of motion and neck supple. No tenderness.     Right lower leg: No edema.     Left lower leg: No edema.   Skin:    General: Skin is warm.     Coloration: Skin is not pale.     Findings: No erythema or rash.   Neurological:     Mental Status: She is alert and oriented to person, place, and time.     Sensory: Sensory deficit present.     Motor: No weakness or abnormal muscle tone.     Deep Tendon Reflexes: Reflexes are normal and symmetric.   Psychiatric:        Mood and Affect: Mood normal.     (all labs ordered are listed, but only abnormal results are displayed) Labs Reviewed  LIPASE, BLOOD - Abnormal; Notable for the following components:      Result Value   Lipase 61 (*)    All other components within normal limits  COMPREHENSIVE METABOLIC PANEL WITH GFR - Abnormal; Notable for the following components:   Glucose, Bld 101 (*)    AST 44 (*)    ALT 48 (*)    All other components within normal limits  URINALYSIS, W/ REFLEX TO CULTURE (INFECTION SUSPECTED) - Abnormal; Notable for the following components:   Leukocytes,Ua TRACE (*)    All other components within normal limits  CBC WITH DIFFERENTIAL/PLATELET  CK  HCG, QUANTITATIVE, PREGNANCY  TSH  TROPONIN T, HIGH SENSITIVITY    EKG: EKG Interpretation Date/Time:  Wednesday March 06 2024 09:13:24 EDT Ventricular Rate:  85 PR Interval:  143 QRS Duration:  107 QT Interval:  381 QTC  Calculation: 453 R Axis:   63  Text Interpretation: Sinus rhythm when compared to prior, similar appearance No STEMI Confirmed by Ginger Barefoot ([id number]) on 03/06/2024 9:46:43 AM  Radiology: CT ANGIO HEAD NECK W WO CM Result Date: 03/06/2024 CLINICAL DATA:  Neuro deficit, concern for stroke. New left face, arm, and leg numbness as well as dizziness and unsteadiness with falls. EXAM: CT ANGIOGRAPHY HEAD AND NECK WITH AND WITHOUT CONTRAST TECHNIQUE: Multidetector CT imaging of the head and neck was performed using the standard protocol during bolus administration of intravenous contrast. Multiplanar CT image reconstructions and MIPs were obtained to evaluate the vascular anatomy. Carotid stenosis  measurements (when applicable) are obtained utilizing NASCET criteria, using the distal internal carotid diameter as the denominator. RADIATION DOSE REDUCTION: This exam was performed according to the departmental dose-optimization program which includes automated exposure control, adjustment of the mA and/or kV according to patient size and/or use of iterative reconstruction technique. CONTRAST:  80mL OMNIPAQUE  IOHEXOL  350 MG/ML SOLN COMPARISON:  CT head 09/02/2013. FINDINGS: CT HEAD FINDINGS Brain: No acute intracranial hemorrhage. No CT evidence of acute infarct. No edema, mass effect, or midline shift. The basilar cisterns are patent. Ventricles: Ventricles are normal in size and configuration. Vascular: No hyperdense vessel. Skull: No acute or aggressive finding. Sinuses/orbits: The visualized paranasal sinuses are clear. Orbits are symmetric. Other: Mastoid air cells are clear. CTA NECK FINDINGS Aortic arch: Standard configuration of the aortic arch. Imaged portion shows no evidence of aneurysm or dissection. No significant stenosis of the major arch vessel origins. Pulmonary arteries: As permitted by contrast timing, there are no filling defects in the visualized pulmonary arteries. Subclavian arteries: The  subclavian arteries are patent bilaterally. Right carotid system: No evidence of dissection, stenosis (50% or greater), or occlusion. Left carotid system: No evidence of dissection, stenosis (50% or greater), or occlusion. Vertebral arteries: Codominant. No evidence of dissection, stenosis (50% or greater), or occlusion. Skeleton: No acute or aggressive finding noted. Other neck: The visualized airway is patent. No cervical lymphadenopathy. Upper chest: Visualized lung apices are clear. Review of the MIP images confirms the above findings CTA HEAD FINDINGS ANTERIOR CIRCULATION: The intracranial ICAs are patent bilaterally. No significant stenosis, proximal occlusion, aneurysm, or vascular malformation. MCAs: The middle cerebral arteries are patent bilaterally. ACAs: The anterior cerebral arteries are patent bilaterally. POSTERIOR CIRCULATION: No significant stenosis, proximal occlusion, aneurysm, or vascular malformation. PCAs: The posterior cerebral arteries are patent bilaterally. Pcomm: Visualized on the right. SCAs: The superior cerebellar arteries are patent bilaterally. Basilar artery: Patent AICAs: Patent PICAs: Patent Vertebral arteries: The intracranial vertebral arteries are patent. Venous sinuses: As permitted by contrast timing, patent. Anatomic variants: None Review of the MIP images confirms the above findings IMPRESSION: No CT evidence of acute intracranial abnormality. No large vessel occlusion. No high-grade stenosis, aneurysm, or dissection of the arteries in the head and neck. Electronically Signed   By: Donnice Mania M.D.   On: 03/06/2024 12:58   DG Chest Portable 1 View Result Date: 03/06/2024 CLINICAL DATA:  Dizziness, syncope. EXAM: PORTABLE CHEST 1 VIEW COMPARISON:  October 22, 2021. FINDINGS: The heart size and mediastinal contours are within normal limits. Both lungs are clear. The visualized skeletal structures are unremarkable. IMPRESSION: No active disease. Electronically Signed    By: Lynwood Landy Raddle M.D.   On: 03/06/2024 12:11     Procedures   Medications Ordered in the ED  sodium chloride  0.9 % bolus 1,000 mL (0 mLs Intravenous Stopped 03/06/24 1235)  meclizine  (ANTIVERT ) tablet 50 mg (50 mg Oral Given 03/06/24 1052)  iohexol  (OMNIPAQUE ) 350 MG/ML injection 100 mL (80 mLs Intravenous Contrast Given 03/06/24 1148)                                    Medical Decision Making Amount and/or Complexity of Data Reviewed Labs: ordered. Radiology: ordered.  Risk Prescription drug management.    VENBA ZENNER is a 53 y.o. female with a past medical history significant for hypertension, pancreatitis, CHF, previous cholecystectomy, and dyslipidemia who presents with 2 weeks of waxing and  waning lightheadedness and near syncope with several days of more persistent dizziness, head spinning, and new numbness in her left side.  According to patient, she has no history of stroke or vertigo but about 2 weeks ago she was getting over a viral illness when she started having some lightheaded spells.  She reports her vision went dark at times and she nearly passed out but did not actually syncopized.  She reports episodes of lightheadedness.  This started to improve but then over the last 3 days or so she has been having dizziness that felt like room spinning and head spinning especially with head movement.  She reports she has noticed a different sensation in her left side with numbness in her left face, left arm, and left leg.  No history of this.  No history of traumatic injury although she did have a fall today with the numbness and spinning.  She denies diplopia and has not had nausea or vomiting and she has not had any speech difficulties.  She denies any current chest pain or palpitations or shortness of breath but has some chronic left upper abdominal aching that she attributes to her pancreatitis issues.  Denies constipation, diarrhea, or urinary changes.  Denies other sick  contacts or other complaints.  On exam, lungs clear.  Chest nontender.  Neck is nontender and there was no carotid bruit.  Pupils are symmetric and reactive with normal extraocular movements.  Symmetric smile.  Clear speech.  The patient does have numbness in her left face, left arm, and left leg compared to the right side.  It is subtle but she reports it is different.  She had intact strength in both arms and strength in both legs and had normal finger-nose-finger testing.  She did have some nystagmus going to the right with her eyes and this provoked her spinning dizziness.  Her chest and abdomen were nontender and exam otherwise reassuring.  Abdomen is not focally tender but should have the left upper abdomen aching that she says feels like pancreatitis.  With the patient's lightheadedness, near syncope, and now dizziness with unilateral numbness I am concerned about possible vertigo with dehydration versus neurologic etiology or stroke.  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  I suspect she could be dehydrated and was just getting over a viral illness when she had some lightheadedness that could have provoked vertigo and near syncope.  However, with the possible posterior circulation symptoms and the unilateral numbness, will get CTA head and neck and give her some fluids and meclizine  for possible vertigo.  Will get screening labs.  Anticipate reassessment and discussion with neurology to discuss if she needs MRI which we do not have this facility here today.  1:18 PM CT scan just returned without evidence of acute stroke or dissection.  Testing thus far from a lab standpoint is also reassuring aside from mild elevation in LFTs and lipase.  CK not elevated.  Urinalysis does not show convincing evidence of UTI.  She is not pregnant and her troponin is undetectable.  Chest x-ray reassuring.  Due to the persistent dizziness that she is still having that is  significant and the left-sided numbness, will call neurology and anticipate they will want MRI today.  2:02 PM Spoke to Dr. Lindzen with neurology who does recommend MRI brain with and without contrast to further evaluate.  They also requested special attention to the 8th cranial nerve which I put in  the comments.  He felt that if MRI is reassuring this is likely peripheral in vertigo and would be safe for outpatient follow-up but if concerning fines are discovered, neurology team could be called.  I called and spoke with Dr. Larnell Gravely who except the patient for ED to ED transfer to [facility] for MRI today.     Final diagnoses:  Left sided numbness  Dizziness    Clinical Impression: 1. Left sided numbness   2. Dizziness     Disposition: ED to ED transfer accepted by Dr. Gravely to get MRI to rule out acute stroke.  This note was prepared with assistance of Conservation officer, historic buildings. Occasional wrong-word or sound-a-like substitutions may have occurred due to the inherent limitations of voice recognition software.       Teresa Valenzuela, Teresa PARAS, MD 03/06/24 [phone]

## 2024-03-06 NOTE — ED Notes (Signed)
Handoff report given to [facility] 

## 2024-03-06 NOTE — ED Notes (Signed)
 States does not feel any better

## 2024-03-06 NOTE — ED Provider Notes (Signed)
 [facility] for [facility].  MRI was requested in consultation with neurology.  Of concern could be may be just vertigo.  If MRI negative patient may be discharged home.  Patient stating she is going to need some Ativan  for the MRI.  MRIs already been ordered.  I will order the Ativan .  Patient here nontoxic no acute distress is stable.  Sitting in a wheelchair.   Katrece Roediger, MD 03/06/24 [phone]

## 2024-03-06 NOTE — ED Notes (Signed)
 Patient already falling asleep in the wheelchair but patient is going to be transported to MRI

## 2024-03-06 NOTE — ED Triage Notes (Signed)
 Pt to ED via [facility] transfer from [facility] for MRI. Pt has been having intermittent dizzy spells x 2 weeks. A&O X 4.

## 2024-03-06 NOTE — ED Notes (Signed)
 Pt to MRI

## 2024-03-06 NOTE — ED Provider Notes (Signed)
 Pt sent from [facility] for MRI in the setting of acute dizziness.  MRI shows:  IMPRESSION: No acute intracranial abnormality. No abnormal intracranial enhancement.  Vascular loop of the left AICA enters the midportion of the left internal auditory canal without evidence of mass effect on the cranial nerves.  Possible dehiscence of the right superior semicircular canal. Recommend nonemergent correlation with temporal bone CT.  Nonspecific white matter signal abnormality favored to reflect mild chronic microvascular ischemic changes. Differential also includes sequelae of chronic migraine headaches versus prior infection/inflammation.   Electronically Signed By: Donnice Mania M.D. On: 03/06/2024 19:18  Discussed with Dr Emerick.  No need for acute intervention.  May benefit from vestibular  physical therapy.  Return precautions for infection sx, fluid leaking out of the ear.   Randol Simmonds, MD 03/06/24 2022

## 2024-03-06 NOTE — Discharge Instructions (Addendum)
 Follow-up with your primary care doctor to arrange for vestibular therapy.  I also placed a referral to neurology for follow-up evaluation.  Return to the ER for fevers chills or other concerning symptoms.

## 2024-03-14 ENCOUNTER — Ambulatory Visit
